# Patient Record
Sex: Female | Born: 1945 | Race: White | Hispanic: No | Smoking: Never smoker
Health system: Southern US, Community
[De-identification: ages and names within clinical notes are randomized; demographics above are authoritative.]

## PROBLEM LIST (undated history)

## (undated) DIAGNOSIS — M1990 Unspecified osteoarthritis, unspecified site: Secondary | ICD-10-CM

## (undated) DIAGNOSIS — I10 Essential (primary) hypertension: Secondary | ICD-10-CM

## (undated) DIAGNOSIS — K589 Irritable bowel syndrome without diarrhea: Secondary | ICD-10-CM

## (undated) DIAGNOSIS — M069 Rheumatoid arthritis, unspecified: Secondary | ICD-10-CM

## (undated) DIAGNOSIS — T7840XA Allergy, unspecified, initial encounter: Secondary | ICD-10-CM

## (undated) DIAGNOSIS — E039 Hypothyroidism, unspecified: Secondary | ICD-10-CM

## (undated) DIAGNOSIS — H269 Unspecified cataract: Secondary | ICD-10-CM

## (undated) DIAGNOSIS — G43909 Migraine, unspecified, not intractable, without status migrainosus: Secondary | ICD-10-CM

## (undated) DIAGNOSIS — R011 Cardiac murmur, unspecified: Secondary | ICD-10-CM

## (undated) DIAGNOSIS — N809 Endometriosis, unspecified: Secondary | ICD-10-CM

## (undated) DIAGNOSIS — J309 Allergic rhinitis, unspecified: Secondary | ICD-10-CM

## (undated) HISTORY — PX: TUBAL LIGATION: SHX77

## (undated) HISTORY — PX: BREAST SURGERY: SHX581

## (undated) HISTORY — PX: OOPHORECTOMY: SHX86

## (undated) HISTORY — PX: APPENDECTOMY: SHX54

## (undated) HISTORY — DX: Endometriosis, unspecified: N80.9

## (undated) HISTORY — DX: Allergy, unspecified, initial encounter: T78.40XA

## (undated) HISTORY — PX: ABDOMINAL HYSTERECTOMY: SHX81

## (undated) HISTORY — PX: TOTAL ABDOMINAL HYSTERECTOMY W/ BILATERAL SALPINGOOPHORECTOMY: SHX83

## (undated) HISTORY — DX: Unspecified cataract: H26.9

## (undated) HISTORY — PX: TONSILLECTOMY: SUR1361

---
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
## 1973-11-08 DIAGNOSIS — R011 Cardiac murmur, unspecified: Secondary | ICD-10-CM

## 1973-11-08 HISTORY — DX: Cardiac murmur, unspecified: R01.1

## 1973-11-08 HISTORY — PX: BREAST EXCISIONAL BIOPSY: SUR124

## 2005-02-04 ENCOUNTER — Ambulatory Visit: Payer: Self-pay | Admitting: Internal Medicine

## 2005-03-09 ENCOUNTER — Ambulatory Visit: Payer: Self-pay | Admitting: Internal Medicine

## 2006-03-29 ENCOUNTER — Ambulatory Visit: Payer: Self-pay | Admitting: Internal Medicine

## 2007-04-13 ENCOUNTER — Ambulatory Visit: Payer: Self-pay | Admitting: Internal Medicine

## 2008-04-16 ENCOUNTER — Ambulatory Visit: Payer: Self-pay | Admitting: Internal Medicine

## 2009-03-31 ENCOUNTER — Emergency Department: Payer: Self-pay | Admitting: Emergency Medicine

## 2009-04-01 ENCOUNTER — Ambulatory Visit: Payer: Self-pay | Admitting: Internal Medicine

## 2009-04-02 ENCOUNTER — Ambulatory Visit: Payer: Self-pay | Admitting: Internal Medicine

## 2009-04-17 ENCOUNTER — Ambulatory Visit: Payer: Self-pay | Admitting: Internal Medicine

## 2009-06-06 ENCOUNTER — Ambulatory Visit: Payer: Self-pay | Admitting: Internal Medicine

## 2010-04-21 ENCOUNTER — Ambulatory Visit: Payer: Self-pay | Admitting: Internal Medicine

## 2011-04-23 ENCOUNTER — Ambulatory Visit: Payer: Self-pay | Admitting: Internal Medicine

## 2012-04-25 ENCOUNTER — Ambulatory Visit: Payer: Self-pay | Admitting: Internal Medicine

## 2013-04-26 ENCOUNTER — Ambulatory Visit: Payer: Self-pay | Admitting: Internal Medicine

## 2015-04-03 ENCOUNTER — Other Ambulatory Visit: Payer: Self-pay | Admitting: Internal Medicine

## 2015-04-03 DIAGNOSIS — Z1231 Encounter for screening mammogram for malignant neoplasm of breast: Secondary | ICD-10-CM

## 2015-04-15 ENCOUNTER — Ambulatory Visit
Admission: RE | Admit: 2015-04-15 | Discharge: 2015-04-15 | Disposition: A | Discharge status: Home / Self Care | Payer: Medicare Other | Source: Ambulatory Visit | Attending: Internal Medicine | Admitting: Internal Medicine

## 2015-04-15 ENCOUNTER — Ambulatory Visit: Payer: Self-pay

## 2015-04-15 ENCOUNTER — Other Ambulatory Visit: Payer: Self-pay | Admitting: Internal Medicine

## 2015-04-15 DIAGNOSIS — Z1231 Encounter for screening mammogram for malignant neoplasm of breast: Secondary | ICD-10-CM

## 2016-02-02 ENCOUNTER — Encounter: Payer: Self-pay | Admitting: *Deleted

## 2016-02-03 ENCOUNTER — Ambulatory Visit
Admission: RE | Admit: 2016-02-03 | Discharge: 2016-02-03 | Disposition: A | Discharge status: Home / Self Care | Payer: Medicare Other | Source: Ambulatory Visit | Attending: Gastroenterology | Admitting: Gastroenterology

## 2016-02-03 ENCOUNTER — Ambulatory Visit: Payer: Medicare Other | Admitting: Anesthesiology

## 2016-02-03 ENCOUNTER — Encounter: Payer: Self-pay | Admitting: *Deleted

## 2016-02-03 ENCOUNTER — Encounter
Admission: RE | Disposition: A | Discharge status: Home / Self Care | Payer: Self-pay | Source: Ambulatory Visit | Attending: Gastroenterology

## 2016-02-03 DIAGNOSIS — Z438 Encounter for attention to other artificial openings: Secondary | ICD-10-CM | POA: Insufficient documentation

## 2016-02-03 DIAGNOSIS — I10 Essential (primary) hypertension: Secondary | ICD-10-CM | POA: Diagnosis not present

## 2016-02-03 DIAGNOSIS — J309 Allergic rhinitis, unspecified: Secondary | ICD-10-CM | POA: Diagnosis not present

## 2016-02-03 DIAGNOSIS — Z7951 Long term (current) use of inhaled steroids: Secondary | ICD-10-CM | POA: Insufficient documentation

## 2016-02-03 DIAGNOSIS — Z9889 Other specified postprocedural states: Secondary | ICD-10-CM | POA: Diagnosis not present

## 2016-02-03 DIAGNOSIS — K5730 Diverticulosis of large intestine without perforation or abscess without bleeding: Secondary | ICD-10-CM | POA: Insufficient documentation

## 2016-02-03 DIAGNOSIS — Z90710 Acquired absence of both cervix and uterus: Secondary | ICD-10-CM | POA: Insufficient documentation

## 2016-02-03 DIAGNOSIS — E039 Hypothyroidism, unspecified: Secondary | ICD-10-CM | POA: Insufficient documentation

## 2016-02-03 DIAGNOSIS — Z79899 Other long term (current) drug therapy: Secondary | ICD-10-CM | POA: Insufficient documentation

## 2016-02-03 DIAGNOSIS — K589 Irritable bowel syndrome without diarrhea: Secondary | ICD-10-CM | POA: Insufficient documentation

## 2016-02-03 DIAGNOSIS — M069 Rheumatoid arthritis, unspecified: Secondary | ICD-10-CM | POA: Insufficient documentation

## 2016-02-03 DIAGNOSIS — Z1211 Encounter for screening for malignant neoplasm of colon: Secondary | ICD-10-CM | POA: Diagnosis not present

## 2016-02-03 HISTORY — DX: Unspecified osteoarthritis, unspecified site: M19.90

## 2016-02-03 HISTORY — DX: Rheumatoid arthritis, unspecified: M06.9

## 2016-02-03 HISTORY — DX: Essential (primary) hypertension: I10

## 2016-02-03 HISTORY — DX: Migraine, unspecified, not intractable, without status migrainosus: G43.909

## 2016-02-03 HISTORY — DX: Irritable bowel syndrome, unspecified: K58.9

## 2016-02-03 HISTORY — DX: Hypothyroidism, unspecified: E03.9

## 2016-02-03 HISTORY — DX: Allergic rhinitis, unspecified: J30.9

## 2016-02-03 HISTORY — PX: COLONOSCOPY WITH PROPOFOL: SHX5780

## 2016-02-03 SURGERY — COLONOSCOPY WITH PROPOFOL
Anesthesia: General

## 2016-02-03 MED ORDER — MIDAZOLAM HCL 5 MG/5ML IJ SOLN
INTRAMUSCULAR | Status: DC | PRN
  Administered 2016-02-03: 1 mg via INTRAVENOUS

## 2016-02-03 MED ORDER — PROPOFOL 500 MG/50ML IV EMUL
INTRAVENOUS | Status: DC | PRN
  Administered 2016-02-03: 120 ug/kg/min via INTRAVENOUS

## 2016-02-03 MED ORDER — PROPOFOL 10 MG/ML IV BOLUS
INTRAVENOUS | Status: DC | PRN
  Administered 2016-02-03: 50 mg via INTRAVENOUS

## 2016-02-03 MED ORDER — SODIUM CHLORIDE 0.9 % IV SOLN
INTRAVENOUS | Status: DC
  Administered 2016-02-03: 11:00:00 via INTRAVENOUS

## 2016-02-03 MED ORDER — LIDOCAINE HCL (CARDIAC) 20 MG/ML IV SOLN
INTRAVENOUS | Status: DC | PRN
  Administered 2016-02-03: 100 mg via INTRAVENOUS

## 2016-02-03 MED ORDER — SODIUM CHLORIDE 0.9 % IV SOLN: INTRAVENOUS | Status: DC

## 2016-02-03 MED ORDER — FENTANYL CITRATE (PF) 100 MCG/2ML IJ SOLN
INTRAMUSCULAR | Status: DC | PRN
  Administered 2016-02-03: 50 ug via INTRAVENOUS

## 2016-02-03 NOTE — Transfer of Care (Signed)
Immediate Anesthesia Transfer of Care Note  Patient: Barbara Tucker  Procedure(s) Performed: Procedure(s): COLONOSCOPY WITH PROPOFOL (N/A)  Patient Location: PACU  Anesthesia Type:General  Level of Consciousness: awake, alert , oriented and patient cooperative  Airway & Oxygen Therapy: Patient Spontanous Breathing and Patient connected to nasal cannula oxygen  Post-op Assessment: Report given to RN and Post -op Vital signs reviewed and stable  Post vital signs: Reviewed and stable  Last Vitals:  Filed Vitals:   02/03/16 1020 02/03/16 1257  BP: 122/57 110/95  Pulse: 63 59  Temp: 37 C   Resp: 14 14    Complications: No apparent anesthesia complications

## 2016-02-03 NOTE — Anesthesia Postprocedure Evaluation (Signed)
Anesthesia Post Note  Patient: Barbara Tucker  Procedure(s) Performed: Procedure(s) (LRB): COLONOSCOPY WITH PROPOFOL (N/A)  Patient location during evaluation: Endoscopy Anesthesia Type: General Level of consciousness: awake and alert Pain management: pain level controlled Vital Signs Assessment: post-procedure vital signs reviewed and stable Respiratory status: spontaneous breathing, nonlabored ventilation, respiratory function stable and patient connected to nasal cannula oxygen Cardiovascular status: blood pressure returned to baseline and stable Postop Assessment: no signs of nausea or vomiting Anesthetic complications: no    Last Vitals:  Filed Vitals:   02/03/16 1317 02/03/16 1327  BP: 144/69 144/55  Pulse: 53 55  Temp:    Resp: 9 17    Last Pain: There were no vitals filed for this visit.               Cleda Mccreedy Piscitello

## 2016-02-03 NOTE — Op Note (Signed)
[facility], [place] Gastroenterology Patient Name: Barbara Tucker Procedure Date: 02/03/2016 11:57 AM MRN: [id number] Account #: [id number] Date of Birth: [birth year] Admit Type: Outpatient Age: 70 Room: [facility] ENDO ROOM 4 Gender: Female Note Status: Finalized Procedure:            Colonoscopy Indications:          Screening for colorectal malignant neoplasm Providers:            Christena Deem, MD Referring MD:         Barbette Reichmann, MD (Referring MD) Medicines:            Monitored Anesthesia Care Complications:        No immediate complications. Procedure:            Pre-Anesthesia Assessment:                       - ASA Grade Assessment: III - A patient with severe                        systemic disease.                       After obtaining informed consent, the colonoscope was                        passed under direct vision. Throughout the procedure,                        the patient's blood pressure, pulse, and oxygen                        saturations were monitored continuously. The                        Colonoscope was introduced through the anus and                        advanced to the the cecum, identified by appendiceal                        orifice and ileocecal valve. The colonoscopy was                        unusually difficult due to a tortuous colon. Successful                        completion of the procedure was aided by using manual                        pressure. The patient tolerated the procedure well. Findings:      A single medium-mouthed diverticulum was found in the ascending colon.      The sigmoid colon and descending colon were significantly tortuous.       Advancing the scope required changing the patient to a supine position       and using manual pressure.      The digital rectal exam was normal.      The exam was otherwise without abnormality. Impression:           - Diverticulosis in the ascending colon.       - Tortuous colon.                       -  The examination was otherwise normal.                       - No specimens collected. Recommendation:       - Discharge patient to home. Procedure Code(s):    --- Professional ---                       [id number], Colonoscopy, flexible; diagnostic, including                        collection of specimen(s) by brushing or washing, when                        performed (separate procedure) Diagnosis Code(s):    --- Professional ---                       Z12.11, Encounter for screening for malignant neoplasm                        of colon                       K57.30, Diverticulosis of large intestine without                        perforation or abscess without bleeding                       Q43.8, Other specified congenital malformations of                        intestine CPT copyright 2016 American Medical Association. All rights reserved. The codes documented in this report are preliminary and upon coder review may  be revised to meet current compliance requirements. Christena Deem, MD 02/03/2016 12:56:01 PM This report has been signed electronically. Number of Addenda: 0 Note Initiated On: 02/03/2016 11:57 AM Scope Withdrawal Time: 0 hours 6 minutes 51 seconds  Total Procedure Duration: 0 hours 34 minutes 54 seconds       [facility]

## 2016-02-03 NOTE — H&P (Signed)
Outpatient short stay form Pre-procedure 02/03/2016 11:59 AM Barbara Deem MD  Primary Physician: Dr Barbette Reichmann  Reason for visit:  Screening colonoscopy  History of present illness:  Patient is a 70 year old female presenting today for colonoscopy. Her last 1 was 11 years ago. There are no findings. She tolerated her prep well. She takes no aspirin or blood thinning medications.    Current facility-administered medications:  .  0.9 %  sodium chloride infusion, , Intravenous, Continuous, Barbara Deem, MD, Last Rate: 20 mL/hr at 02/03/16 1036 .  0.9 %  sodium chloride infusion, , Intravenous, Continuous, Barbara Deem, MD  Prescriptions prior to admission  Medication Sig Dispense Refill Last Dose  . ascorbic acid (VITAMIN C) 500 MG tablet Take 500 mg by mouth daily.     . calcium carbonate (OSCAL) 1500 (600 Ca) MG TABS tablet Take 600 mg of elemental calcium by mouth 2 (two) times daily with a meal.     . dicyclomine (BENTYL) 10 MG capsule Take 10 mg by mouth 4 (four) times daily.     Marland Kitchen estradiol (ESTRACE) 1 MG tablet Take 1 mg by mouth daily.     Marland Kitchen etodolac (LODINE) 400 MG tablet Take 400 mg by mouth 2 (two) times daily.     . fexofenadine (ALLEGRA) 180 MG tablet Take 180 mg by mouth daily.     . folic acid (FOLVITE) 1 MG tablet Take 1 mg by mouth daily.     . hydroxychloroquine (PLAQUENIL) 200 MG tablet Take 200 mg by mouth 2 (two) times daily.     Marland Kitchen levothyroxine (SYNTHROID, LEVOTHROID) 25 MCG tablet Take 25 mcg by mouth daily before breakfast.   02/03/2016 at 0800  . losartan (COZAAR) 50 MG tablet Take 50 mg by mouth daily.   02/03/2016 at 0900  . methotrexate (RHEUMATREX) 2.5 MG tablet Take 2.5 mg by mouth 2 (two) times daily.     . metoprolol succinate (TOPROL-XL) 25 MG 24 hr tablet Take 25 mg by mouth daily.   02/03/2016 at 0900  . Multiple Vitamin (MULTIVITAMIN) tablet Take 1 tablet by mouth daily.     . Omega-3 Fatty Acids (OMEGA-3 EPA FISH OIL PO) Take 340-1,000  mg by mouth daily.     Marland Kitchen omeprazole (PRILOSEC) 20 MG capsule Take 20 mg by mouth daily.        Allergies  Allergen Reactions  . Codeine   . Sulfa Antibiotics      Past Medical History  Diagnosis Date  . Hypertension   . IBS (irritable bowel syndrome)   . Allergic rhinitis   . Migraine   . Osteoarthritis   . Rheumatoid arthritis (HCC)   . Hypothyroidism     Review of systems:      Physical Exam    Heart and lungs: Regular rate and rhythm without rub or gallop, lungs are bilaterally clear.    HEENT: Normocephalic atraumatic eyes are anicteric    Other:     Pertinant exam for procedure: Soft nontender nondistended bowel sounds positive normoactive.    Planned proceedures: Colonoscopy and indicated procedures. I have discussed the risks benefits and complications of procedures to include not limited to bleeding, infection, perforation and the risk of sedation and the patient wishes to proceed.    Barbara Deem, MD Gastroenterology 02/03/2016  11:59 AM

## 2016-02-03 NOTE — Anesthesia Preprocedure Evaluation (Addendum)
Anesthesia Evaluation  Patient identified by MRN, date of birth, ID band Patient awake    Reviewed: Allergy & Precautions, H&P , NPO status , Patient's Chart, lab work & pertinent test results  History of Anesthesia Complications (+) PONV and history of anesthetic complications  Airway Mallampati: II  TM Distance: >3 FB Neck ROM: limited    Dental  (+) Poor Dentition   Pulmonary neg pulmonary ROS, neg shortness of breath,    Pulmonary exam normal breath sounds clear to auscultation       Cardiovascular Exercise Tolerance: Good hypertension, (-) angina(-) Past MI and (-) DOE Normal cardiovascular exam Rhythm:regular Rate:Normal     Neuro/Psych  Headaches, negative psych ROS   GI/Hepatic negative GI ROS, Neg liver ROS, neg GERD  ,  Endo/Other  Hypothyroidism   Renal/GU negative Renal ROS  negative genitourinary   Musculoskeletal  (+) Arthritis ,   Abdominal   Peds  Hematology negative hematology ROS (+)   Anesthesia Other Findings Past Medical History:   Hypertension                                                 IBS (irritable bowel syndrome)                               Allergic rhinitis                                            Migraine                                                     Osteoarthritis                                               Rheumatoid arthritis (HCC)                                   Hypothyroidism                                              Past Surgical History:   BREAST BIOPSY                                   Right 1975           Comment:excisional - negative   ABDOMINAL HYSTERECTOMY                                        TONSILLECTOMY  APPENDECTOMY                                                  TUBAL LIGATION                                               BMI    Body Mass Index   21.59 kg/m 2    Signs and symptoms  suggestive of sleep apnea    Reproductive/Obstetrics negative OB ROS                            Anesthesia Physical Anesthesia Plan  ASA: III  Anesthesia Plan: General   Post-op Pain Management:    Induction:   Airway Management Planned:   Additional Equipment:   Intra-op Plan:   Post-operative Plan:   Informed Consent: I have reviewed the patients History and Physical, chart, labs and discussed the procedure including the risks, benefits and alternatives for the proposed anesthesia with the patient or authorized representative who has indicated his/her understanding and acceptance.   Dental Advisory Given  Plan Discussed with: Anesthesiologist, CRNA and Surgeon  Anesthesia Plan Comments:         Anesthesia Quick Evaluation

## 2016-02-04 ENCOUNTER — Encounter: Payer: Self-pay | Admitting: Gastroenterology

## 2016-04-13 ENCOUNTER — Other Ambulatory Visit: Payer: Self-pay | Admitting: Internal Medicine

## 2016-04-13 DIAGNOSIS — Z1231 Encounter for screening mammogram for malignant neoplasm of breast: Secondary | ICD-10-CM

## 2016-04-29 ENCOUNTER — Ambulatory Visit: Payer: Medicare Other

## 2016-04-30 ENCOUNTER — Other Ambulatory Visit: Payer: Self-pay | Admitting: Internal Medicine

## 2016-04-30 ENCOUNTER — Ambulatory Visit
Admission: RE | Admit: 2016-04-30 | Discharge: 2016-04-30 | Disposition: A | Discharge status: Home / Self Care | Payer: Medicare Other | Source: Ambulatory Visit | Attending: Internal Medicine | Admitting: Internal Medicine

## 2016-04-30 DIAGNOSIS — Z1231 Encounter for screening mammogram for malignant neoplasm of breast: Secondary | ICD-10-CM | POA: Insufficient documentation

## 2016-08-09 DIAGNOSIS — Z23 Encounter for immunization: Secondary | ICD-10-CM | POA: Diagnosis not present

## 2016-08-17 DIAGNOSIS — Z79899 Other long term (current) drug therapy: Secondary | ICD-10-CM | POA: Diagnosis not present

## 2016-08-17 DIAGNOSIS — M059 Rheumatoid arthritis with rheumatoid factor, unspecified: Secondary | ICD-10-CM | POA: Diagnosis not present

## 2016-08-26 DIAGNOSIS — M059 Rheumatoid arthritis with rheumatoid factor, unspecified: Secondary | ICD-10-CM | POA: Diagnosis not present

## 2016-08-26 DIAGNOSIS — M150 Primary generalized (osteo)arthritis: Secondary | ICD-10-CM | POA: Diagnosis not present

## 2016-09-08 ENCOUNTER — Encounter: Payer: Self-pay | Admitting: Occupational Therapy

## 2016-09-08 ENCOUNTER — Ambulatory Visit: Payer: Medicare Other | Attending: Rheumatology | Admitting: Occupational Therapy

## 2016-09-08 DIAGNOSIS — M25642 Stiffness of left hand, not elsewhere classified: Secondary | ICD-10-CM | POA: Diagnosis not present

## 2016-09-08 DIAGNOSIS — M79644 Pain in right finger(s): Secondary | ICD-10-CM | POA: Diagnosis not present

## 2016-09-08 DIAGNOSIS — M25641 Stiffness of right hand, not elsewhere classified: Secondary | ICD-10-CM | POA: Diagnosis not present

## 2016-09-08 DIAGNOSIS — M79641 Pain in right hand: Secondary | ICD-10-CM | POA: Insufficient documentation

## 2016-09-08 DIAGNOSIS — M79645 Pain in left finger(s): Secondary | ICD-10-CM | POA: Diagnosis not present

## 2016-09-08 DIAGNOSIS — M79642 Pain in left hand: Secondary | ICD-10-CM | POA: Diagnosis not present

## 2016-09-08 DIAGNOSIS — M6281 Muscle weakness (generalized): Secondary | ICD-10-CM | POA: Diagnosis not present

## 2016-09-08 NOTE — Therapy (Signed)
[facility]OR[facility]ICINE [address], [place], [address] Phone: [phone]   Fax:  [phone]  Occupational Therapy Evaluation  Patient Details  Name: Barbara Tucker MRN: [id number] Date of Birth: 70/23/1947 Referring Provider: Dr . Beverley Fiedler  Encounter Date: 09/08/2016      OT End of Session - 09/08/16 1328    Visit Number 1   Number of Visits 12   Date for OT Re-Evaluation 11/03/16  G Code every 10th visit   Authorization Type BC[employer] Medicare - Needs G Code Every 10 th Visit   Authorization Time Period G code every 10 th visit   Authorization - Visit Number 1   Authorization - Number of Visits 10   OT Start Time [phone]   OT Stop Time 1055   OT Time Calculation (min) 66 min   Activity Tolerance Patient tolerated treatment well   Behavior During Therapy [facility] for tasks assessed/performed      Past Medical History:  Diagnosis Date  . Allergic rhinitis   . Hypertension   . Hypothyroidism   . IBS (irritable bowel syndrome)   . Migraine   . Osteoarthritis   . Rheumatoid arthritis [facility])     Past Surgical History:  Procedure Laterality Date  . ABDOMINAL HYSTERECTOMY    . APPENDECTOMY    . BREAST EXCISIONAL BIOPSY Right 1975   excisional - negative  . COLONOSCOPY WITH PROPOFOL N/A 02/03/2016   Procedure: COLONOSCOPY WITH PROPOFOL;  Surgeon: Christena Deem, MD;  Location: [facility] ENDOSCOPY;  Service: Endoscopy;  Laterality: N/A;  . OOPHORECTOMY Bilateral   . TONSILLECTOMY    . TUBAL LIGATION      There were no vitals filed for this visit.      Subjective Assessment - 09/08/16 0953    Subjective  Pt reports that she has bilateral Hand srthritis. She sees Dr Gerri Lins [place]. She states that about 2-3 years ago, she was dx w/ RA. She is RHD and presents today w/ c/o pain L IF, small as well as right thumb CMC and MF.   Patient Stated Goals Increase ROM; and be able to grasp on R hand [facility]); less pain   Currently  in Pain? Yes   Pain Score 1    Pain Location --  Right thumb   Pain Orientation Right   Pain Descriptors / Indicators Sore;Aching   Pain Onset More than a month ago   Pain Frequency Intermittent   Aggravating Factors  Grasp/grip   Pain Relieving Factors Wears fingerless glove that helps to keep right hand warm; Wrist brace (cock-up splint) at Noc   Multiple Pain Sites No           [facility] OT Assessment - 09/08/16 0001      Assessment   Diagnosis Bilateral Hand Pain secondary to Arhtritis/RA   Referring Provider Dr . Beverley Fiedler   Onset Date --  "Years"     Precautions   Precautions None   Required Braces or Orthoses Other Brace/Splint   Other Brace/Splint Wears wrist cock-up splint at Noc R      Restrictions   Weight Bearing Restrictions No     Balance Screen   Has the patient fallen in the past 6 months Yes   How many times? I missed a step and tumbled a little bit.   Has the patient had a decrease in activity level because of a fear of falling?  No   Is the patient  reluctant to leave their home because of a fear of falling?  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 (Opening jars, small zippers etc).     IADL   Light Housekeeping Does personal laundry completely;Launders small items, rinses stockings, etc.;Performs light daily tasks such as dishwashing, bed making   Meal Prep --  Does not do, never has. Spouse does   Medication Management Is responsible for taking medication in correct dosages at correct time     Mobility   Mobility Status Independent     Written Expression   Dominant Hand Right   Handwriting --  Pt states 75% of her normal handwriting R     Vision - History   Baseline Vision Wears glasses all the time     Cognition   Overall Cognitive Status Within Functional Limits for tasks assessed     Observation/Other Assessments   Other Surveys  Select   Quick DASH  Quick DASH Disability/Symptom Score = 20.45 bilateral hands   Outcome Measures Quick DASH Work Module = 18.75; Performing Module (quilting/sewing) = 25     Sensation   Light Touch Appears Intact  Pt reports R shoulder numbness for some time now     Coordination   Gross Motor Movements are Fluid and Coordinated Yes   Fine Motor Movements are Fluid and Coordinated No   Coordination Pt reports that coordination "Is not as easy as it used to be"     Edema   Edema Pt reports edema in affected joints particularly when joints are painful or having an arthritic flare.     ROM / Strength   AROM / PROM / Strength AROM;Strength     AROM   Overall AROM  Within functional limits for tasks performed   AROM Assessment Site Finger   Right/Left Finger Left     Right Hand AROM   R Thumb MCP 0-60 58 Degrees   R Thumb IP 0-80 76 Degrees  -20* extension   R Thumb Opposition to Index --  WFL's   R Index  MCP 0-90 92 Degrees   R  Index PIP 0-100 110 Degrees   R Index DIP 0-70 50 Degrees  -16   R Long  MCP 0-90 96 Degrees   R Long PIP 0-100 110 Degrees   R Long DIP 0-70 66 Degrees  -35   R Ring  MCP 0-90 92 Degrees  R Ring PIP 0-100 108 Degrees   R Ring DIP 0-70 66 Degrees   R Little  MCP 0-90 98 Degrees   R Little PIP 0-100 104 Degrees   R Little DIP 0-70 64 Degrees  C/O soreness and pain in DIPJ/  -40* extension     Left Hand AROM   L Thumb MCP 0-60 70 Degrees   L Thumb IP 0-80 66 Degrees  -15* extension   L Thumb Opposition to Index --  WFL's; Able to oppose to small   L Index  MCP 0-90 90 Degrees   L Index PIP 0-100 90 Degrees   L Index DIP 0-70 55 Degrees  -30   L Long  MCP 0-90 92 Degrees   L Long PIP 0-100 94 Degrees   L Long DIP 0-70 79 Degrees  -10   L Ring  MCP 0-90 84 Degrees   L Ring PIP 0-100 100 Degrees   L Ring DIP 0-70 80 Degrees   L Little  MCP 0-90 91 Degrees   L Little PIP 0-100 101 Degrees   L Little DIP 0-70 76 Degrees  -28     Hand Function   Right Hand Grip (lbs) 56   Right Hand Lateral Pinch 12 lbs   Right Hand 3 Point Pinch 11 lbs   Left Hand Grip (lbs) 50   Left Hand Lateral Pinch 12 lbs   Left 3 point pinch 10.5 lbs        Pt was instructed in the following Home AROM program for bilateral hands/wrists: Opposition; tendon gliding; finger extension on tabletop; strengthening/AROM of RD - palm flat on table, spread thumb, move first finger to thumb, move second finger, move ring finger and move little finger. Pt verbalized understanding in clinic and was able to return demonstration.            OT Treatments/Exercises (OP) - 09/08/16 0001      Modalities   Modalities Paraffin     RUE Paraffin   Number Minutes Paraffin 10 Minutes   RUE Paraffin Location Hand   Type --  Prior to HEP   Comments Decrease pain, increased circulation, increase AROM joints      LUE Paraffin   Number Minutes Paraffin 10 Minutes   LUE Paraffin Location Hand   Type --   Prior to HEP   Comments Decrease pain, increased circulation, increase AROM joints                OT Education - 09/08/16 1326    Education provided Yes   Education Details Home program for general AROM ex's bilateral hands w/ arthritis; Discussed findings from assessment, recommendations and POC.   Person(s) Educated Patient   Methods Explanation;Demonstration;Verbal cues;Handout   Comprehension Verbalized understanding;Verbal cues required;Need further instruction          OT Short Term Goals - 09/08/16 1339      OT SHORT TERM GOAL #1   Title Pt will be I HEP bilateral Hands/UE's for AROM   Time 2   Period Weeks   Status New     OT SHORT TERM GOAL #2   Title Pt will be Mod I splint use, care and precautions bilateral hands    Time 2   Period Weeks   Status New           OT Long Term Goals - 09/08/16 1340      OT LONG TERM GOAL #1   Title Pt will be  Mod I bilateral UE's for HEP secondary to arthritis   Time 4   Period Weeks   Status New     OT LONG TERM GOAL #2   Title Pt will be Mod I energy conservation techniques as seen by ability to I'ly state 2-3 techniques that she can use during ADL's   Time 4   Period Weeks   Status New     OT LONG TERM GOAL #3   Title Pt will be Mod I Joint protection techniques as seen by demonstration in clinic/w/ functional activities   Time 4   Period Weeks   Status New     OT LONG TERM GOAL #4   Title Pt will I'ly state 2-3 possible pieces of A/E that she may use to assist with increasing Independence w/ ADL's/functional activity.   Time 4   Period Weeks   Status New               Plan - 09-29-2016 1331    Clinical Impression Statement Pt is a plesant 70 y/o RHD female whom presents with bilateral hand and wrist pain secondary to arthritis and RA. Per OT assessment today, pt has dificits in ROM, strength impacting her ability to perform ADL's, IADL's, homemaking and functional activities. She also reports  difficulty with handwriting, sewing and occasionally drops things. She should benefit from out-pt OT/hand therapy for pt education, home program, A/E recommendations, splinting etc. in order to assist with increased independence for daily activities using bilateral hands as well as symptom management.   Rehab Potential Good   OT Frequency 2x / week   OT Duration 4 weeks   OT Treatment/Interventions Self-care/ADL training;Fluidtherapy;Moist Heat;DME and/or AE instruction;Splinting;Patient/family education;Therapeutic exercises;Ultrasound;Therapeutic exercise;Therapeutic activities;Parrafin;Energy conservation;Manual Therapy   Plan Paraffin/Fluidotherapy, Consider issue of neoprene thumb CMC splint next visit (R > L for pain) for use with functional activity; Review/upgrade HEP.   Consulted and Agree with Plan of Care Patient      Patient will benefit from skilled therapeutic intervention in order to improve the following deficits and impairments:  Decreased coordination, Decreased range of motion, Impaired flexibility, Decreased activity tolerance, Pain, Impaired UE functional use, Decreased knowledge of use of DME, Decreased strength  Visit Diagnosis: Pain in right hand - Plan: Ot plan of care cert/re-cert  Pain in left hand - Plan: Ot plan of care cert/re-cert  Pain in right finger(s) - Plan: Ot plan of care cert/re-cert  Pain in left finger(s) - Plan: Ot plan of care cert/re-cert  Muscle weakness (generalized) - Plan: Ot plan of care cert/re-cert  Stiffness of right hand, not elsewhere classified - Plan: Ot plan of care cert/re-cert  Stiffness of left hand, not elsewhere classified - Plan: Ot plan of care cert/re-cert      G-Codes - September 29, 2016 1348    Functional Assessment Tool Used Quick DASH; Clinical Judgement   Functional Limitation Carrying, moving and handling objects   Carrying, Moving and Handling Objects Current Status (I0973) At least 20 percent but less than 40 percent  impaired, limited or restricted   Carrying, Moving and Handling Objects Goal Status (Z3299) At least 1 percent but less than 20 percent impaired, limited or restricted      Problem List There are no active problems to display for this patient.   Charletta Cousin, Basil Buffin Beth Dixon 09/29/16, 2:03 PM  [place] [facility] PHYSICAL AND SPORTS MEDICINE [address] [place], [address] Phone: [phone]   Fax:  [phone]  Name:  CHINAZA ROOKE MRN: [id number] Date of Birth: June 12, 1946

## 2016-09-08 NOTE — Therapy (Signed)
[facility] PHYS[facility]OR[facility]ICINE [address] [place], [address] Phone: [phone]   Fax:  [phone]  Occupational Therapy Evaluation  Patient Details  Name: Barbara Tucker MRN: [id number] Date of Birth: February 09, 1946 Referring Provider: Dr . Andrez Grime  Encounter Date: 09/08/2016      OT End of Session - 09/08/16 1328    Visit Number 1   Number of Visits 12   Date for OT Re-Evaluation 11/03/16  G Code every 10th visit   Authorization Type BCBS Medicare - Needs G Code Every 10 th Visit   Authorization Time Period G code every 10 th visit   Authorization - Visit Number 1   Authorization - Number of Visits 10   OT Start Time [phone]   OT Stop Time 1055   OT Time Calculation (min) 66 min   Activity Tolerance Patient tolerated treatment well   Behavior During Therapy [facility] for tasks assessed/performed      Past Medical History:  Diagnosis Date  . Allergic rhinitis   . Hypertension   . Hypothyroidism   . IBS (irritable bowel syndrome)   . Migraine   . Osteoarthritis   . Rheumatoid arthritis [facility])     Past Surgical History:  Procedure Laterality Date  . ABDOMINAL HYSTERECTOMY    . APPENDECTOMY    . BREAST EXCISIONAL BIOPSY Right 1975   excisional - negative  . COLONOSCOPY WITH PROPOFOL N/A 02/03/2016   Procedure: COLONOSCOPY WITH PROPOFOL;  Surgeon: Christena Deem, MD;  Location: [facility] ENDOSCOPY;  Service: Endoscopy;  Laterality: N/A;  . OOPHORECTOMY Bilateral   . TONSILLECTOMY    . TUBAL LIGATION      There were no vitals filed for this visit.      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 She sees Dr Gerri Lins [place]. She states that about 2-3 years ago, she was dx w/ RA. She is RHD and presents today w/ c/o pain L IF, small as well as right thumb CMC and MF.   Patient Stated Goals Increase ROM; and be able to grasp on R hand [facility]); less pain    Currently in Pain? Yes   Pain Score 1    Pain Location --  Right thumb   Pain Orientation Right   Pain Descriptors / Indicators Sore;Aching   Pain Onset More than a month ago   Pain Frequency Intermittent   Aggravating Factors  Grasp/grip   Pain Relieving Factors Wears fingerless glove that helps to keep right hand warm; Wrist brace (cock-up splint) at Noc   Multiple Pain Sites No           [facility] OT Assessment - 09/08/16 0001      Assessment   Diagnosis Bilateral Hand Pain secondary to Arhtritis/RA   Referring Provider Dr . Beverley Fiedler   Onset Date --  "Years"     Precautions   Precautions None   Required Braces or Orthoses Other Brace/Splint   Other Brace/Splint Wears wrist cock-up splint at Noc R      Restrictions   Weight Bearing Restrictions No     Balance Screen   Has the patient fallen in the past 6 months Yes   How many times? I missed a step and tumbled a little bit.   Has the patient had a decrease in activity level because of a fear of falling?  No   Is the  patient reluctant to leave their home because of a fear of falling?  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 (Opening jars, small zippers etc).     IADL   Light Housekeeping Does personal laundry completely;Launders small items, rinses stockings, etc.;Performs light daily tasks such as dishwashing, bed making   Meal Prep --  Does not do, never has. Spouse does   Medication Management Is responsible for taking medication in correct dosages at correct time     Mobility   Mobility Status Independent     Written Expression   Dominant Hand Right   Handwriting --  Pt states 75% of her normal handwriting R     Vision - History   Baseline Vision Wears glasses all the time     Cognition   Overall Cognitive Status Within Functional Limits for tasks assessed     Observation/Other Assessments   Other Surveys  Select   Quick DASH  Quick DASH Disability/Symptom Score = 20.45 bilateral hands   Outcome Measures Quick DASH Work Module = 18.75; Performing Module (quilting/sewing) = 25     Sensation   Light Touch Appears Intact  Pt reports R shoulder numbness for some time now     Coordination   Gross Motor Movements are Fluid and Coordinated Yes   Fine Motor Movements are Fluid and Coordinated No   Coordination Pt reports that coordination "Is not as easy as it used to be"     Edema   Edema Pt reports edema in affected joints particularly when joints are painful or having an arthritic flare.     ROM / Strength   AROM / PROM / Strength AROM;Strength     AROM   Overall AROM  Within functional limits for tasks performed   AROM Assessment Site Finger   Right/Left Finger Left     Right Hand AROM   R Thumb MCP 0-60 58 Degrees   R Thumb IP 0-80 76 Degrees  -20* extension   R Thumb Opposition to Index --  WFL's   R Index  MCP 0-90 92 Degrees    R Index PIP 0-100 110 Degrees   R Index DIP 0-70 50 Degrees  -16   R Long  MCP 0-90 96 Degrees   R Long PIP 0-100 110 Degrees   R Long DIP 0-70 66 Degrees  -35   R Ring  MCP 0-90 92  Degrees   R Ring PIP 0-100 108 Degrees   R Ring DIP 0-70 66 Degrees   R Little  MCP 0-90 98 Degrees   R Little PIP 0-100 104 Degrees   R Little DIP 0-70 64 Degrees  C/O soreness and pain in DIPJ/  -40* extension     Left Hand AROM   L Thumb MCP 0-60 70 Degrees   L Thumb IP 0-80 66 Degrees  -15* extension   L Thumb Opposition to Index --  WFL's; Able to oppose to small   L Index  MCP 0-90 90 Degrees   L Index PIP 0-100 90 Degrees   L Index DIP 0-70 55 Degrees  -30   L Long  MCP 0-90 92 Degrees   L Long PIP 0-100 94 Degrees   L Long DIP 0-70 79 Degrees  -10   L Ring  MCP 0-90 84 Degrees   L Ring PIP 0-100 100 Degrees   L Ring DIP 0-70 80 Degrees   L Little  MCP 0-90 91 Degrees   L Little PIP 0-100 101 Degrees   L Little DIP 0-70 76 Degrees  -28     Hand Function   Right Hand Grip (lbs) 56   Right Hand Lateral Pinch 12 lbs   Right Hand 3 Point Pinch 11 lbs   Left Hand Grip (lbs) 50   Left Hand Lateral Pinch 12 lbs   Left 3 point pinch 10.5 lbs                  OT Treatments/Exercises (OP) - 09/08/16 0001      Modalities   Modalities Paraffin     RUE Paraffin   Number Minutes Paraffin 10 Minutes   RUE Paraffin Location Hand   Type --  Prior to HEP   Comments Decrease pain, increased circulation, increase AROM joints      LUE Paraffin   Number Minutes Paraffin 10 Minutes   LUE Paraffin Location Hand   Type --  Prior to HEP   Comments Decrease pain, increased circulation, increase AROM joints                OT Education - 09/08/16 1326    Education provided Yes   Education Details Home program for general AROM ex's bilateral hands w/ arthritis; Discussed findings from assessment, recommendations and POC.   Person(s) Educated Patient   Methods  Explanation;Demonstration;Verbal cues;Handout   Comprehension Verbalized understanding;Verbal cues required;Need further instruction          OT Short Term Goals - 09/08/16 1339      OT SHORT TERM GOAL #1   Title Pt will be I HEP bilateral Hands/UE's for AROM   Time 2   Period Weeks   Status New     OT SHORT TERM GOAL #2   Title Pt will be Mod I splint use, care and precautions bilateral hands    Time 2   Period Weeks   Status New           OT Long Term Goals - 09/08/16 1340      OT LONG TERM GOAL #1   Title Pt will be Mod I bilateral UE's for HEP secondary to arthritis   Time 4   Period Weeks   Status New     OT LONG TERM GOAL #2   Title Pt will be Mod I energy conservation techniques as seen by ability to I'ly state 2-3 techniques that she can  use during ADL's   Time 4   Period Weeks   Status New     OT LONG TERM GOAL #3   Title Pt will be Mod I Joint protection techniques as seen by demonstration in clinic/w/ functional activities   Time 4   Period Weeks   Status New     OT LONG TERM GOAL #4   Title Pt will I'ly state 2-3 possible pieces of A/E that she may use to assist with increasing Independence w/ ADL's/functional activity.   Time 4   Period Weeks   Status New               Plan - 09/30/2016 1331    Clinical Impression Statement Pt is a plesant 70 y/o RHD female whom presents with bilateral hand and wrist pain secondary to arthritis and RA. Per OT assessment today, pt has deficits in ROM, strength impacting her ability to perform ADL's, IADL's, homemaking and functional activities. She also reports difficulty with handwriting, sewing and occasionally drops things. She should benefit from out-pt OT/hand therapy for pt education, home program, A/E recommendations, splinting etc. in order to assist with increased independence for daily activities using bilateral hands as well as symptom management.   Rehab Potential Good   OT Frequency 2x / week    OT Duration 4 weeks   OT Treatment/Interventions Self-care/ADL training;Fluidtherapy;Moist Heat;DME and/or AE instruction;Splinting;Patient/family education;Therapeutic exercises;Ultrasound;Therapeutic exercise;Therapeutic activities;Parrafin;Energy conservation;Manual Therapy   Plan Paraffin/Fluidotherapy, Consider issue of neoprene thumb CMC splint next visit (R > L for pain) for use with functional activity; Review/upgrade HEP.   Consulted and Agree with Plan of Care Patient      Patient will benefit from skilled therapeutic intervention in order to improve the following deficits and impairments:  Decreased coordination, Decreased range of motion, Impaired flexibility, Decreased activity tolerance, Pain, Impaired UE functional use, Decreased knowledge of use of DME, Decreased strength  Visit Diagnosis: Pain in right hand - Plan: Ot plan of care cert/re-cert  Pain in left hand - Plan: Ot plan of care cert/re-cert  Pain in right finger(s) - Plan: Ot plan of care cert/re-cert  Pain in left finger(s) - Plan: Ot plan of care cert/re-cert  Muscle weakness (generalized) - Plan: Ot plan of care cert/re-cert  Stiffness of right hand, not elsewhere classified - Plan: Ot plan of care cert/re-cert  Stiffness of left hand, not elsewhere classified - Plan: Ot plan of care cert/re-cert      G-Codes - September 30, 2016 1348    Functional Assessment Tool Used Quick DASH; Clinical Judgement   Functional Limitation Carrying, moving and handling objects   Carrying, Moving and Handling Objects Current Status (Q2449) At least 20 percent but less than 40 percent impaired, limited or restricted   Carrying, Moving and Handling Objects Goal Status (P5300) At least 1 percent but less than 20 percent impaired, limited or restricted      Problem List There are no active problems to display for this patient.   Charletta Cousin, Amy Beth Dixon, OTR/L 2016/09/30, 1:59 PM  [facility]  PHYSICAL AND SPORTS MEDICINE [address], [place], [address] Phone: [phone]   Fax:  [phone]  Name: Barbara Tucker MRN: [id number] Date of Birth: October 22, 1946

## 2016-09-08 NOTE — Patient Instructions (Addendum)
Pt was instructed in the following Home AROM program for bilateral hands/wrists: Opposition; tendon gliding; finger extension on tabletop; strengthening/AROM of RD - palm flat on table, spread thumb, move first finger to thumb, move second finger, move ring finger and move little finger. Pt verbalized understanding in clinic and was able to return demonstration.

## 2016-09-14 ENCOUNTER — Ambulatory Visit: Payer: Medicare Other | Admitting: Occupational Therapy

## 2016-09-14 DIAGNOSIS — M79641 Pain in right hand: Secondary | ICD-10-CM | POA: Diagnosis not present

## 2016-09-14 DIAGNOSIS — M25641 Stiffness of right hand, not elsewhere classified: Secondary | ICD-10-CM

## 2016-09-14 DIAGNOSIS — M6281 Muscle weakness (generalized): Secondary | ICD-10-CM

## 2016-09-14 DIAGNOSIS — M79644 Pain in right finger(s): Secondary | ICD-10-CM

## 2016-09-14 DIAGNOSIS — M79642 Pain in left hand: Secondary | ICD-10-CM

## 2016-09-14 DIAGNOSIS — M79645 Pain in left finger(s): Secondary | ICD-10-CM

## 2016-09-14 DIAGNOSIS — M25642 Stiffness of left hand, not elsewhere classified: Secondary | ICD-10-CM

## 2016-09-14 NOTE — Therapy (Signed)
[facility] PHYS[facility]ORTS MEDICINE [address], [place], [address] Phone: [phone]   Fax:  [phone]  Occupational Therapy Treatment  Patient Details  Name: Barbara Tucker MRN: [id number] Date of Birth: 1946/02/04 Referring Provider: Dr . Beverley Fiedler  Encounter Date: 09/14/2016      OT End of Session - 09/14/16 1724    Visit Number 2   Number of Visits 12   Date for OT Re-Evaluation 11/03/16   Authorization Type BCBS Medicare - Needs G Code Every 10 th Visit   Authorization Time Period G code every 10 th visit   Authorization - Visit Number 1   Authorization - Number of Visits 10   OT Start Time 1030   OT Stop Time 1128   OT Time Calculation (min) 58 min   Activity Tolerance Patient tolerated treatment well   Behavior During Therapy [facility] for tasks assessed/performed      Past Medical History:  Diagnosis Date  . Allergic rhinitis   . Hypertension   . Hypothyroidism   . IBS (irritable bowel syndrome)   . Migraine   . Osteoarthritis   . Rheumatoid arthritis [facility])     Past Surgical History:  Procedure Laterality Date  . ABDOMINAL HYSTERECTOMY    . APPENDECTOMY    . BREAST EXCISIONAL BIOPSY Right 1975   excisional - negative  . COLONOSCOPY WITH PROPOFOL N/A 02/03/2016   Procedure: COLONOSCOPY WITH PROPOFOL;  Surgeon: Christena Deem, MD;  Location: [facility] ENDOSCOPY;  Service: Endoscopy;  Laterality: N/A;  . OOPHORECTOMY Bilateral   . TONSILLECTOMY    . TUBAL LIGATION      There were no vitals filed for this visit.      Subjective Assessment - 09/14/16 1712    Subjective  I did the exercises - pain different , every day - today still my R thumb , and index ,and L index finger - cannot make tigth fist - grip is good - but cannot hold on to objects   Patient Stated Goals Increase ROM; and be able to grasp on R hand [facility]); less pain   Currently in Pain? Yes   Pain Score 3    Pain Location Finger (Comment which  one)   Pain Orientation Right   Pain Descriptors / Indicators Aching;Sore   Pain Onset More than a month ago   Pain Frequency Intermittent                      OT Treatments/Exercises (OP) - 09/14/16 0001      RUE Paraffin   Number Minutes Paraffin 10 Minutes   RUE Paraffin Location Hand   Comments decrease pain and stiffness at [facility]     LUE Paraffin   Number Minutes Paraffin 10 Minutes   LUE Paraffin Location Hand   Comments Decrease pain and stiffness at [facility]     Measurements taken for bilateral thumbs and 2nd digit See flow sheet Fitted pt with medium + CMC neoprene splint for R thumb - but pt's pain and grip was the same   pt still had 11-12 lbs for lateral and 3 point  - splint use not indicated at this time  Pt ed on joint protection principles - using larger joints and avoid sustained/tight grip As well as respect pain - if 2hrs and 12-14 hrs later after act - modify  Ed on  AE - and hand out provided for both   Soft tissue  mobs done to webspace after paraffin - and add some gentle stretch to PA and RA   pt showed increase ROM  In R thumb by 3-5 degrees  and L thumb 5-8 degrees  As well as increase AROM at 2nd digits -MC and PIP's  Pt to cont with tendon glides , opposition to all digits, RD of digits and add AROM for PA and RA of thumb  Pt to use heat prior to ROM HEP to decrease pain and stiffness and increase motion             OT Education - 09/14/16 1724    Education provided Yes   Education Details HEP changes and AE/joint protection principles ed on    Person(s) Educated Patient   Methods Explanation;Demonstration;Tactile cues;Verbal cues;Handout   Comprehension Verbal cues required;Returned demonstration;Verbalized understanding          OT Short Term Goals - 09/08/16 1339      OT SHORT TERM GOAL #1   Title Pt will be I HEP bilateral Hands/UE's for AROM   Time 2   Period Weeks   Status New     OT SHORT TERM GOAL #2   Title Pt  will be Mod I splint use, care and precautions bilateral hands    Time 2   Period Weeks   Status New           OT Long Term Goals - 09/08/16 1340      OT LONG TERM GOAL #1   Title Pt will be Mod I bilateral UE's for HEP secondary to arthritis   Time 4   Period Weeks   Status New     OT LONG TERM GOAL #2   Title Pt will be Mod I energy conservation techniques as seen by ability to I'ly state 2-3 techniques that she can use during ADL's   Time 4   Period Weeks   Status New     OT LONG TERM GOAL #3   Title Pt will be Mod I Joint protection techniques as seen by demonstration in clinic/w/ functional activities   Time 4   Period Weeks   Status New     OT LONG TERM GOAL #4   Title Pt will I'ly state 2-3 possible pieces of A/E that she may use to assist with increasing Independence w/ ADL's/functional activity.   Time 4   Period Weeks   Status New               Plan - 09/14/16 1725    Clinical Impression Statement Pt's AROM and strength is WFL - pt to show some hyper extention at MP 's of thumbs - and decrease AROM at bilateal 2nd digits - but after paraffin and ROM - pt increase ROM - and pain decrease - pt  did same with and without CMC neoprenes split when fitted and tested on R hand - pt to cont with HEP and joint protection /AE - and follow up with me in week - otherwise can phone and cancel if doing okay - pt to maintain ROM and stenght - and keep pain under control     Rehab Potential Good   OT Frequency 1x / week   OT Duration 2 weeks   OT Treatment/Interventions Self-care/ADL training;Fluidtherapy;Moist Heat;DME and/or AE instruction;Splinting;Patient/family education;Therapeutic exercises;Ultrasound;Therapeutic exercise;Therapeutic activities;Parrafin;Energy conservation;Manual Therapy   Plan assess progress with HEP for pain , ROM at thumbs and 2nd digits   OT Home Exercise Plan see pt instruction  Consulted and Agree with Plan of Care Patient       Patient will benefit from skilled therapeutic intervention in order to improve the following deficits and impairments:  Decreased coordination, Decreased range of motion, Impaired flexibility, Decreased activity tolerance, Pain, Impaired UE functional use, Decreased knowledge of use of DME, Decreased strength  Visit Diagnosis: Pain in right hand  Pain in left hand  Pain in right finger(s)  Pain in left finger(s)  Muscle weakness (generalized)  Stiffness of right hand, not elsewhere classified  Stiffness of left hand, not elsewhere classified    Problem List There are no active problems to display for this patient.   Oletta Cohn OTR/L,CLT 09/14/2016, 5:29 PM  [place] [facility] PHYSICAL [facility] SPORTS MEDICINE [address] [place], [address] Phone: [phone]   Fax:  [phone]  Name: Barbara Tucker MRN: [id number] Date of Birth: [birth year]

## 2016-09-14 NOTE — Patient Instructions (Signed)
Pt to use oint protection principles - using larger joints and avoid sustained/tight grip As well as respect pain - if 2hrs and 12-14 hrs later after act - modify  Ed on  AE - and hand out provided for both   Soft tissue mobs can be done prior to ROM at home after heat to webspace   pt showed increase ROM  In R thumb by 3-5 degrees  and L thumb 5-8 degrees in clinic As well as increase AROM at 2nd digits -MC and PIP's  Pt to cont with tendon glides , opposition to all digits, RD of digits and add AROM for PA and RA of thumb  Pt to use heat prior to ROM HEP to decrease pain and stiffness and increase motion

## 2016-09-21 ENCOUNTER — Ambulatory Visit: Payer: Medicare Other | Admitting: Occupational Therapy

## 2016-09-21 DIAGNOSIS — M79641 Pain in right hand: Secondary | ICD-10-CM | POA: Diagnosis not present

## 2016-09-21 DIAGNOSIS — M25641 Stiffness of right hand, not elsewhere classified: Secondary | ICD-10-CM

## 2016-09-21 DIAGNOSIS — M79642 Pain in left hand: Secondary | ICD-10-CM

## 2016-09-21 DIAGNOSIS — M79645 Pain in left finger(s): Secondary | ICD-10-CM

## 2016-09-21 DIAGNOSIS — M79644 Pain in right finger(s): Secondary | ICD-10-CM

## 2016-09-21 DIAGNOSIS — M25642 Stiffness of left hand, not elsewhere classified: Secondary | ICD-10-CM

## 2016-09-21 DIAGNOSIS — M6281 Muscle weakness (generalized): Secondary | ICD-10-CM

## 2016-09-21 NOTE — Therapy (Signed)
[place] [facility] [address] [place], [address] Phone: [phone]   Fax:  [phone]  Occupational Therapy Treatment/discharge  Patient Details  Name: Barbara Tucker MRN: [id number] Date of Birth: [birth year] Referring Provider: Dr . Beverley Fiedler  Encounter Date: 09/21/2016      OT End of Session - 09/21/16 1510    Visit Number 3   Number of Visits 3   Date for OT Re-Evaluation 09/21/16   OT Start Time 1130   OT Stop Time 1208   OT Time Calculation (min) 38 min   Activity Tolerance Patient tolerated treatment well   Behavior During Therapy [facility] for tasks assessed/performed      Past Medical History:  Diagnosis Date  . Allergic rhinitis   . Hypertension   . Hypothyroidism   . IBS (irritable bowel syndrome)   . Migraine   . Osteoarthritis   . Rheumatoid arthritis [facility])     Past Surgical History:  Procedure Laterality Date  . ABDOMINAL HYSTERECTOMY    . APPENDECTOMY    . BREAST EXCISIONAL BIOPSY Right 1975   excisional - negative  . COLONOSCOPY WITH PROPOFOL N/A 02/03/2016   Procedure: COLONOSCOPY WITH PROPOFOL;  Surgeon: Christena Deem, MD;  Location: [facility] ENDOSCOPY;  Service: Endoscopy;  Laterality: N/A;  . OOPHORECTOMY Bilateral   . TONSILLECTOMY    . TUBAL LIGATION      There were no vitals filed for this visit.      Subjective Assessment - 09/21/16 1131    Subjective  My thumbs are better - trying to use the joint protection with avoid tight grip, use larger joints - have a few questions - but doing better - pain better    Patient Stated Goals Increase ROM; and be able to grasp on R hand [facility]); less pain   Currently in Pain? No/denies            [facility] OT Assessment - 09/21/16 0001      Strength   Strength Assessment Site --   Right Hand Grip (lbs) 56   Right Hand Lateral Pinch 14 lbs   Right Hand 3 Point Pinch 12 lbs   Left Hand Grip (lbs) 50   Left Hand Lateral Pinch 14  lbs   Left Hand 3 Point Pinch 10 lbs      Discuss with pt progress in pain , joint protection and modifications Pt had questions on jewelery , moving large pots or strain food out of large pots ,  using of larger joints without increase pain or stress on wrists, computer setup.  Also reviewed HEP with pt  - had question on RD of digits and manual massage to webspace prior to AROM of thumb PA and RA Pt's grip is still at the top for her age, and prehension mid range - pt to decrease stress on joints , decrease pain , and increase /maintain ROM in digits - and maintain strength.                     OT Education - 09/21/16 1509    Education provided Yes   Education Details discharge instructions    Person(s) Educated Patient   Methods Explanation;Demonstration;Tactile cues;Verbal cues   Comprehension Verbal cues required;Returned demonstration;Verbalized understanding          OT Short Term Goals - 09/21/16 1512      OT SHORT TERM GOAL #1   Title Pt will be  I HEP bilateral Hands/UE's for AROM   Status Achieved     OT SHORT TERM GOAL #2   Title Pt will be Mod I splint use, care and precautions bilateral hands    Baseline pt not candidate for splints yet   Status Revised           OT Long Term Goals - 09/21/16 1513      OT LONG TERM GOAL #1   Title Pt will be Mod I bilateral UE's for HEP secondary to arthritis   Status Achieved     OT LONG TERM GOAL #2   Title Pt will be Mod I energy conservation techniques as seen by ability to I'ly state 2-3 techniques that she can use during ADL's   Status Achieved     OT LONG TERM GOAL #3   Title Pt will be Mod I Joint protection techniques as seen by demonstration in clinic/w/ functional activities   Status Achieved     OT LONG TERM GOAL #4   Title Pt will I'ly state 2-3 possible pieces of A/E that she may use to assist with increasing Independence w/ ADL's/functional activity.   Status Achieved                Plan - 09/21/16 1510    Clinical Impression Statement Pt made progress in pain , applying joint protection to ADL's and IADL's - pt show AROM WFL - grip is at the top for her age - and prehension midrange - pt has knowledge on HEP and joint protection/AE- and is discharge at this time with home program    OT Treatment/Interventions Self-care/ADL training;Fluidtherapy;Moist Heat;DME and/or AE instruction;Splinting;Patient/family education;Therapeutic exercises;Ultrasound;Therapeutic exercise;Therapeutic activities;Parrafin;Energy conservation;Manual Therapy   Plan discharge with home program   OT Home Exercise Plan see pt instruction      Patient will benefit from skilled therapeutic intervention in order to improve the following deficits and impairments:     Visit Diagnosis: Pain in right hand  Pain in left hand  Pain in right finger(s)  Pain in left finger(s)  Muscle weakness (generalized)  Stiffness of right hand, not elsewhere classified  Stiffness of left hand, not elsewhere classified    Problem List There are no active problems to display for this patient.   Oletta Cohn OTR/L,CLT 09/21/2016, 4:56 PM  [place] [facility] PHYSICAL AND SPORTS MEDICINE [address], [place], [address] Phone: [phone]   Fax:  [phone]  Name: Barbara Tucker MRN: [id number] Date of Birth: June 06, 1946

## 2016-09-21 NOTE — Patient Instructions (Signed)
Pt to cont with joint protection and AE  HEP after heat - tendon glides, Opposition, RD of digits and thumb PA and RA

## 2016-10-07 DIAGNOSIS — Z0000 Encounter for general adult medical examination without abnormal findings: Secondary | ICD-10-CM | POA: Diagnosis not present

## 2016-10-07 DIAGNOSIS — Z1231 Encounter for screening mammogram for malignant neoplasm of breast: Secondary | ICD-10-CM | POA: Diagnosis not present

## 2016-10-07 DIAGNOSIS — M0579 Rheumatoid arthritis with rheumatoid factor of multiple sites without organ or systems involvement: Secondary | ICD-10-CM | POA: Diagnosis not present

## 2016-10-07 DIAGNOSIS — I10 Essential (primary) hypertension: Secondary | ICD-10-CM | POA: Diagnosis not present

## 2016-10-07 DIAGNOSIS — M159 Polyosteoarthritis, unspecified: Secondary | ICD-10-CM | POA: Diagnosis not present

## 2016-10-14 DIAGNOSIS — M0579 Rheumatoid arthritis with rheumatoid factor of multiple sites without organ or systems involvement: Secondary | ICD-10-CM | POA: Diagnosis not present

## 2016-10-14 DIAGNOSIS — Z0000 Encounter for general adult medical examination without abnormal findings: Secondary | ICD-10-CM | POA: Diagnosis not present

## 2016-10-14 DIAGNOSIS — M150 Primary generalized (osteo)arthritis: Secondary | ICD-10-CM | POA: Diagnosis not present

## 2016-10-14 DIAGNOSIS — I10 Essential (primary) hypertension: Secondary | ICD-10-CM | POA: Diagnosis not present

## 2016-11-16 DIAGNOSIS — M059 Rheumatoid arthritis with rheumatoid factor, unspecified: Secondary | ICD-10-CM | POA: Diagnosis not present

## 2016-11-16 DIAGNOSIS — M150 Primary generalized (osteo)arthritis: Secondary | ICD-10-CM | POA: Diagnosis not present

## 2017-02-15 DIAGNOSIS — M150 Primary generalized (osteo)arthritis: Secondary | ICD-10-CM | POA: Diagnosis not present

## 2017-02-15 DIAGNOSIS — M059 Rheumatoid arthritis with rheumatoid factor, unspecified: Secondary | ICD-10-CM | POA: Diagnosis not present

## 2017-03-02 DIAGNOSIS — M25512 Pain in left shoulder: Secondary | ICD-10-CM | POA: Diagnosis not present

## 2017-03-02 DIAGNOSIS — M059 Rheumatoid arthritis with rheumatoid factor, unspecified: Secondary | ICD-10-CM | POA: Diagnosis not present

## 2017-03-02 DIAGNOSIS — Z79899 Other long term (current) drug therapy: Secondary | ICD-10-CM | POA: Diagnosis not present

## 2017-03-02 DIAGNOSIS — M150 Primary generalized (osteo)arthritis: Secondary | ICD-10-CM | POA: Diagnosis not present

## 2017-04-11 DIAGNOSIS — M0579 Rheumatoid arthritis with rheumatoid factor of multiple sites without organ or systems involvement: Secondary | ICD-10-CM | POA: Diagnosis not present

## 2017-04-11 DIAGNOSIS — I10 Essential (primary) hypertension: Secondary | ICD-10-CM | POA: Diagnosis not present

## 2017-04-11 DIAGNOSIS — M150 Primary generalized (osteo)arthritis: Secondary | ICD-10-CM | POA: Diagnosis not present

## 2017-04-18 DIAGNOSIS — M0579 Rheumatoid arthritis with rheumatoid factor of multiple sites without organ or systems involvement: Secondary | ICD-10-CM | POA: Diagnosis not present

## 2017-04-18 DIAGNOSIS — Z1231 Encounter for screening mammogram for malignant neoplasm of breast: Secondary | ICD-10-CM | POA: Diagnosis not present

## 2017-04-18 DIAGNOSIS — J302 Other seasonal allergic rhinitis: Secondary | ICD-10-CM | POA: Diagnosis not present

## 2017-04-18 DIAGNOSIS — I10 Essential (primary) hypertension: Secondary | ICD-10-CM | POA: Diagnosis not present

## 2017-04-26 ENCOUNTER — Other Ambulatory Visit: Payer: Self-pay | Admitting: Internal Medicine

## 2017-04-26 DIAGNOSIS — Z1231 Encounter for screening mammogram for malignant neoplasm of breast: Secondary | ICD-10-CM

## 2017-05-10 ENCOUNTER — Ambulatory Visit
Admission: RE | Admit: 2017-05-10 | Discharge: 2017-05-10 | Disposition: A | Discharge status: Home / Self Care | Payer: Medicare Other | Source: Ambulatory Visit | Attending: Internal Medicine | Admitting: Internal Medicine

## 2017-05-10 DIAGNOSIS — R928 Other abnormal and inconclusive findings on diagnostic imaging of breast: Secondary | ICD-10-CM | POA: Insufficient documentation

## 2017-05-10 DIAGNOSIS — N6489 Other specified disorders of breast: Secondary | ICD-10-CM | POA: Diagnosis not present

## 2017-05-10 DIAGNOSIS — Z1231 Encounter for screening mammogram for malignant neoplasm of breast: Secondary | ICD-10-CM | POA: Diagnosis not present

## 2017-05-16 ENCOUNTER — Other Ambulatory Visit: Payer: Self-pay | Admitting: Internal Medicine

## 2017-05-16 DIAGNOSIS — R928 Other abnormal and inconclusive findings on diagnostic imaging of breast: Secondary | ICD-10-CM

## 2017-05-16 DIAGNOSIS — N6489 Other specified disorders of breast: Secondary | ICD-10-CM

## 2017-05-23 ENCOUNTER — Ambulatory Visit
Admission: RE | Admit: 2017-05-23 | Discharge: 2017-05-23 | Disposition: A | Discharge status: Home / Self Care | Payer: Medicare Other | Source: Ambulatory Visit | Attending: Internal Medicine | Admitting: Internal Medicine

## 2017-05-23 DIAGNOSIS — N6489 Other specified disorders of breast: Secondary | ICD-10-CM

## 2017-05-23 DIAGNOSIS — R928 Other abnormal and inconclusive findings on diagnostic imaging of breast: Secondary | ICD-10-CM

## 2017-05-23 DIAGNOSIS — R922 Inconclusive mammogram: Secondary | ICD-10-CM | POA: Diagnosis not present

## 2017-06-01 DIAGNOSIS — M059 Rheumatoid arthritis with rheumatoid factor, unspecified: Secondary | ICD-10-CM | POA: Diagnosis not present

## 2017-06-01 DIAGNOSIS — Z79899 Other long term (current) drug therapy: Secondary | ICD-10-CM | POA: Diagnosis not present

## 2017-08-02 DIAGNOSIS — M069 Rheumatoid arthritis, unspecified: Secondary | ICD-10-CM | POA: Diagnosis not present

## 2017-08-02 DIAGNOSIS — Z79899 Other long term (current) drug therapy: Secondary | ICD-10-CM | POA: Diagnosis not present

## 2017-08-12 DIAGNOSIS — Z23 Encounter for immunization: Secondary | ICD-10-CM | POA: Diagnosis not present

## 2017-08-30 DIAGNOSIS — Z79899 Other long term (current) drug therapy: Secondary | ICD-10-CM | POA: Diagnosis not present

## 2017-08-30 DIAGNOSIS — M059 Rheumatoid arthritis with rheumatoid factor, unspecified: Secondary | ICD-10-CM | POA: Diagnosis not present

## 2017-09-13 DIAGNOSIS — Z79899 Other long term (current) drug therapy: Secondary | ICD-10-CM | POA: Diagnosis not present

## 2017-09-13 DIAGNOSIS — M059 Rheumatoid arthritis with rheumatoid factor, unspecified: Secondary | ICD-10-CM | POA: Diagnosis not present

## 2017-09-13 DIAGNOSIS — M150 Primary generalized (osteo)arthritis: Secondary | ICD-10-CM | POA: Diagnosis not present

## 2017-10-11 DIAGNOSIS — M0579 Rheumatoid arthritis with rheumatoid factor of multiple sites without organ or systems involvement: Secondary | ICD-10-CM | POA: Diagnosis not present

## 2017-10-11 DIAGNOSIS — Z0000 Encounter for general adult medical examination without abnormal findings: Secondary | ICD-10-CM | POA: Diagnosis not present

## 2017-10-11 DIAGNOSIS — J302 Other seasonal allergic rhinitis: Secondary | ICD-10-CM | POA: Diagnosis not present

## 2017-10-11 DIAGNOSIS — I10 Essential (primary) hypertension: Secondary | ICD-10-CM | POA: Diagnosis not present

## 2017-10-26 DIAGNOSIS — R232 Flushing: Secondary | ICD-10-CM | POA: Diagnosis not present

## 2017-10-26 DIAGNOSIS — J302 Other seasonal allergic rhinitis: Secondary | ICD-10-CM | POA: Diagnosis not present

## 2017-10-26 DIAGNOSIS — I10 Essential (primary) hypertension: Secondary | ICD-10-CM | POA: Diagnosis not present

## 2017-10-26 DIAGNOSIS — M0579 Rheumatoid arthritis with rheumatoid factor of multiple sites without organ or systems involvement: Secondary | ICD-10-CM | POA: Diagnosis not present

## 2017-12-14 DIAGNOSIS — Z79899 Other long term (current) drug therapy: Secondary | ICD-10-CM | POA: Diagnosis not present

## 2017-12-14 DIAGNOSIS — M059 Rheumatoid arthritis with rheumatoid factor, unspecified: Secondary | ICD-10-CM | POA: Diagnosis not present

## 2018-03-07 DIAGNOSIS — M059 Rheumatoid arthritis with rheumatoid factor, unspecified: Secondary | ICD-10-CM | POA: Diagnosis not present

## 2018-03-07 DIAGNOSIS — Z79899 Other long term (current) drug therapy: Secondary | ICD-10-CM | POA: Diagnosis not present

## 2018-03-14 DIAGNOSIS — M150 Primary generalized (osteo)arthritis: Secondary | ICD-10-CM | POA: Diagnosis not present

## 2018-03-14 DIAGNOSIS — Z79899 Other long term (current) drug therapy: Secondary | ICD-10-CM | POA: Diagnosis not present

## 2018-03-14 DIAGNOSIS — M059 Rheumatoid arthritis with rheumatoid factor, unspecified: Secondary | ICD-10-CM | POA: Diagnosis not present

## 2018-03-24 ENCOUNTER — Telehealth: Payer: Self-pay | Admitting: Family Medicine

## 2018-03-24 NOTE — Telephone Encounter (Signed)
That's fine

## 2018-03-24 NOTE — Telephone Encounter (Signed)
Copied from CRM [id number]. Topic: Inquiry >> Mar 24, 2018  2:31 PM Jonette Eva wrote: Reason for CRM: pt called to ask since her husband [phone], is a pt of [facility] she would like to ask if its possible for her to be a new pt of hers pt told that [facility] is not taking new pt's, call to advise  >> Mar 24, 2018  3:18 PM Sharol Given wrote: Forwarding for approval.

## 2018-04-26 ENCOUNTER — Encounter: Payer: Self-pay | Admitting: Family Medicine

## 2018-05-22 ENCOUNTER — Ambulatory Visit: Payer: Self-pay | Admitting: Family Medicine

## 2018-05-25 ENCOUNTER — Ambulatory Visit: Payer: Medicare Other | Admitting: Family Medicine

## 2018-05-25 ENCOUNTER — Encounter: Payer: Self-pay | Admitting: Family Medicine

## 2018-05-25 VITALS — BP 137/82 | HR 63 | Temp 99.2°F | Ht 67.3 in | Wt 147.6 lb

## 2018-05-25 DIAGNOSIS — I10 Essential (primary) hypertension: Secondary | ICD-10-CM | POA: Insufficient documentation

## 2018-05-25 DIAGNOSIS — E039 Hypothyroidism, unspecified: Secondary | ICD-10-CM | POA: Diagnosis not present

## 2018-05-25 DIAGNOSIS — Z1231 Encounter for screening mammogram for malignant neoplasm of breast: Secondary | ICD-10-CM

## 2018-05-25 DIAGNOSIS — Z13220 Encounter for screening for lipoid disorders: Secondary | ICD-10-CM

## 2018-05-25 DIAGNOSIS — Z1159 Encounter for screening for other viral diseases: Secondary | ICD-10-CM | POA: Diagnosis not present

## 2018-05-25 DIAGNOSIS — M059 Rheumatoid arthritis with rheumatoid factor, unspecified: Secondary | ICD-10-CM

## 2018-05-25 DIAGNOSIS — M8580 Other specified disorders of bone density and structure, unspecified site: Secondary | ICD-10-CM | POA: Diagnosis not present

## 2018-05-25 DIAGNOSIS — E038 Other specified hypothyroidism: Secondary | ICD-10-CM | POA: Insufficient documentation

## 2018-05-25 DIAGNOSIS — Z1239 Encounter for other screening for malignant neoplasm of breast: Secondary | ICD-10-CM

## 2018-05-25 DIAGNOSIS — M069 Rheumatoid arthritis, unspecified: Secondary | ICD-10-CM | POA: Insufficient documentation

## 2018-05-25 NOTE — Assessment & Plan Note (Signed)
Under good control. Continue current regimen. Continue to monitor. Checking labs. Await results. Call with any concerns. Does not need refills at this time.

## 2018-05-25 NOTE — Assessment & Plan Note (Signed)
Under good control. Continue current regimen. Continue to monitor. Checking labs. Await results. Call with any concerns. Does not need refills at this time.  

## 2018-05-25 NOTE — Progress Notes (Signed)
BP 137/82 (BP Location: Left Arm, Patient Position: Sitting, Cuff Size: Normal)   Pulse 63   Temp 99.2 F (37.3 C)   Ht 5' 7.3" (1.709 m)   Wt 147 lb 9 oz (66.9 kg)   SpO2 97%   BMI 22.91 kg/m    Subjective:    Patient ID: Barbara Tucker, female    DOB: [birth year], 72 y.o.   MRN: [id number]  HPI: Barbara Tucker is a 72 y.o. female  Chief Complaint  Patient presents with  . Hypertension  . Hypothyroidism  . Establish Care   Needs a mammogram ordered.   HYPERTENSION Hypertension status: controlled  Satisfied with current treatment? yes Duration of hypertension: chronic BP monitoring frequency:  not checking BP medication side effects:  no Medication compliance: excellent compliance Previous BP meds: losartan, metoprolol Aspirin: yes Recurrent headaches: yes Visual changes: yes Palpitations: no Dyspnea: no Chest pain: no Lower extremity edema: no Dizzy/lightheaded: no  HYPOTHYROIDISM- diagnosed several years ago, hasn't been taking the  Thyroid control status:stable Satisfied with current treatment? yes Medication side effects: Not on anything Medication compliance: poor compliance Recent dose adjustment:no Fatigue: no Cold intolerance: no Heat intolerance: no Weight gain: no Weight loss: no Constipation: no Diarrhea/loose stools: no Palpitations: no Lower extremity edema: no Anxiety/depressed mood: no   Active Ambulatory Problems    Diagnosis Date Noted  . Hypertension   . Hypothyroidism   . Rheumatoid arthritis ([place])   . Osteopenia 05/25/2018   Resolved Ambulatory Problems    Diagnosis Date Noted  . No Resolved Ambulatory Problems   Past Medical History:  Diagnosis Date  . Allergic rhinitis   . Endometriosis   . Hypertension   . Hypothyroidism   . IBS (irritable bowel syndrome)   . Migraine   . Osteoarthritis   . Rheumatoid arthritis [facility])    Past Surgical History:  Procedure Laterality Date  . ABDOMINAL HYSTERECTOMY    .  APPENDECTOMY    . BREAST EXCISIONAL BIOPSY Right 1975   excisional - negative  . COLONOSCOPY WITH PROPOFOL N/A 02/03/2016   Procedure: COLONOSCOPY WITH PROPOFOL;  Surgeon: Lollie Sails, MD;  Location: [facility] ENDOSCOPY;  Service: Endoscopy;  Laterality: N/A;  . OOPHORECTOMY Bilateral   . TONSILLECTOMY    . TUBAL LIGATION     Outpatient Encounter Medications as of 05/25/2018  Medication Sig  . ascorbic acid (VITAMIN C) 500 MG tablet Take 500 mg by mouth daily.  . calcium carbonate (OSCAL) 1500 (600 Ca) MG TABS tablet Take 600 mg of elemental calcium by mouth 2 (two) times daily with a meal.  . Calcium Polycarbophil (FIBER-CAPS PO) Take by mouth.  . dicyclomine (BENTYL) 10 MG capsule Take 10 mg by mouth 4 (four) times daily.  Marland Kitchen estradiol (ESTRACE) 1 MG tablet Take 1 mg by mouth daily.  Marland Kitchen etodolac (LODINE) 400 MG tablet Take 400 mg by mouth 2 (two) times daily.  . fexofenadine (ALLEGRA) 180 MG tablet Take 180 mg by mouth daily.  . folic acid (FOLVITE) 1 MG tablet Take 1 mg by mouth daily.  Marland Kitchen guaiFENesin (MUCINEX) 600 MG 12 hr tablet Take by mouth 2 (two) times daily.  . hydroxychloroquine (PLAQUENIL) 200 MG tablet Take 200 mg by mouth 2 (two) times daily.  Marland Kitchen losartan (COZAAR) 50 MG tablet Take 50 mg by mouth daily.  . methotrexate (RHEUMATREX) 2.5 MG tablet Take 2.5 mg by mouth 2 (two) times daily.  . metoprolol succinate (TOPROL-XL) 25 MG 24 hr tablet Take 25  mg by mouth daily.  . Multiple Vitamin (MULTIVITAMIN) tablet Take 1 tablet by mouth daily.  . Omega-3 Fatty Acids (OMEGA-3 EPA FISH OIL PO) Take 340-1,000 mg by mouth daily.  Marland Kitchen omeprazole (PRILOSEC) 20 MG capsule Take 20 mg by mouth daily.  . [DISCONTINUED] levothyroxine (SYNTHROID, LEVOTHROID) 25 MCG tablet Take 25 mcg by mouth daily before breakfast.   No facility-administered encounter medications on file as of 05/25/2018.    Allergies  Allergen Reactions  . Codeine   . Lisinopril Other (See Comments)    Migraine  . Sulfa  Antibiotics    Social History   Socioeconomic History  . Marital status: Married    Spouse name: Not on file  . Number of children: Not on file  . Years of education: Not on file  . Highest education level: Not on file  Occupational History  . Not on file  Social Needs  . Financial resource strain: Not on file  . Food insecurity:    Worry: Not on file    Inability: Not on file  . Transportation needs:    Medical: Not on file    Non-medical: Not on file  Tobacco Use  . Smoking status: Never Smoker  . Smokeless tobacco: Never Used  Substance and Sexual Activity  . Alcohol use: Yes    Comment: On Occasion  . Drug use: No  . Sexual activity: Not Currently  Lifestyle  . Physical activity:    Days per week: Not on file    Minutes per session: Not on file  . Stress: Not on file  Relationships  . Social connections:    Talks on phone: Not on file    Gets together: Not on file    Attends religious service: Not on file    Active member of club or organization: Not on file    Attends meetings of clubs or organizations: Not on file    Relationship status: Not on file  . Intimate partner violence:    Fear of current or ex partner: Not on file    Emotionally abused: Not on file    Physically abused: Not on file    Forced sexual activity: Not on file  Other Topics Concern  . Not on file  Social History Narrative  . Not on file   Family History  Problem Relation Age of Onset  . Breast cancer Paternal Grandmother   . Cancer Paternal Grandmother        Breast  . Heart disease Paternal Grandmother   . Stroke Mother   . Arthritis Mother        Osteo and rheumatoid  . Hypertension Mother   . Angina Mother   . Cancer Father        Multiple Myeloma  . Gout Father   . Cancer Sister        Breast  . Heart disease Sister   . Cancer Daughter        Prostate  . Cancer Sister        Skin    Review of Systems  Constitutional: Negative.   HENT: Positive for congestion,  postnasal drip and rhinorrhea. Negative for dental problem, drooling, ear discharge, ear pain, facial swelling, hearing loss, mouth sores, nosebleeds, sinus pressure, sinus pain, sneezing, sore throat, tinnitus, trouble swallowing and voice change.   Respiratory: Positive for shortness of breath (occasionally for the past couple of months). Negative for apnea, cough, choking, chest tightness, wheezing and stridor.   Cardiovascular: Positive  for palpitations. Negative for chest pain and leg swelling.  Neurological: Positive for headaches.    Per HPI unless specifically indicated above     Objective:    BP 137/82 (BP Location: Left Arm, Patient Position: Sitting, Cuff Size: Normal)   Pulse 63   Temp 99.2 F (37.3 C)   Ht 5' 7.3" (1.709 m)   Wt 147 lb 9 oz (66.9 kg)   SpO2 97%   BMI 22.91 kg/m   Wt Readings from Last 3 Encounters:  05/25/18 147 lb 9 oz (66.9 kg)  02/03/16 142 lb (64.4 kg)    Physical Exam  Constitutional: She is oriented to person, place, and time. She appears well-developed and well-nourished. No distress.  HENT:  Head: Normocephalic and atraumatic.  Right Ear: Hearing normal.  Left Ear: Hearing normal.  Nose: Nose normal.  Eyes: Conjunctivae and lids are normal. Right eye exhibits no discharge. Left eye exhibits no discharge. No scleral icterus.  Cardiovascular: Normal rate, regular rhythm, normal heart sounds and intact distal pulses. Exam reveals no gallop and no friction rub.  No murmur heard. Pulmonary/Chest: Effort normal and breath sounds normal. No stridor. No respiratory distress. She has no wheezes. She has no rales. She exhibits no tenderness.  Musculoskeletal: Normal range of motion.  Neurological: She is alert and oriented to person, place, and time.  Skin: Skin is warm, dry and intact. Capillary refill takes less than 2 seconds. No rash noted. She is not diaphoretic. No erythema. No pallor.  Psychiatric: She has a normal mood and affect. Her speech  is normal and behavior is normal. Judgment and thought content normal. Cognition and memory are normal.  Nursing note and vitals reviewed.   No results found for this or any previous visit.    Assessment & Plan:   Problem List Items Addressed This Visit      Cardiovascular and Mediastinum   Hypertension - Primary    Under good control. Continue current regimen. Continue to monitor. Checking labs. Await results. Call with any concerns. Does not need refills at this time.       Relevant Orders   CBC with Differential/Platelet   Comprehensive metabolic panel   Urine Microalbumin w/creat. ratio   Urinalysis, Routine w reflex microscopic     Endocrine   Hypothyroidism    Under good control. Continue current regimen. Continue to monitor. Checking labs. Await results. Call with any concerns. Does not need refills at this time.       Relevant Orders   CBC with Differential/Platelet   Comprehensive metabolic panel   Thyroid Panel With TSH   Urinalysis, Routine w reflex microscopic     Musculoskeletal and Integument   Rheumatoid arthritis ([place])    Follows with Dr. Luane School. Continue to follow with them. Call with any concerns.       Relevant Orders   CBC with Differential/Platelet   Comprehensive metabolic panel   Sed Rate (ESR)   Urinalysis, Routine w reflex microscopic   Osteopenia    Other Visit Diagnoses    Encounter for hepatitis C screening test for low risk patient       Labs to be drawn at the hospital- ordered. Await results.    Relevant Orders   Hepatitis C Antibody   Screening for cholesterol level       Labs to be drawn at the hospital- ordered. Await results.    Relevant Orders   Lipid Profile   Screening for breast cancer  Mammogram ordered   Relevant Orders   MM DIGITAL SCREENING BILATERAL       Follow up plan: Return When able, for Physical and Wellness.

## 2018-05-25 NOTE — Assessment & Plan Note (Signed)
Follows with Dr. Baron Sane. Continue to follow with them. Call with any concerns.

## 2018-05-26 ENCOUNTER — Other Ambulatory Visit
Admission: RE | Admit: 2018-05-26 | Discharge: 2018-05-26 | Disposition: A | Discharge status: Home / Self Care | Payer: Medicare Other | Source: Ambulatory Visit | Attending: Family Medicine | Admitting: Family Medicine

## 2018-05-26 DIAGNOSIS — E039 Hypothyroidism, unspecified: Secondary | ICD-10-CM | POA: Diagnosis not present

## 2018-05-26 DIAGNOSIS — I10 Essential (primary) hypertension: Secondary | ICD-10-CM | POA: Diagnosis not present

## 2018-05-26 DIAGNOSIS — Z1159 Encounter for screening for other viral diseases: Secondary | ICD-10-CM | POA: Diagnosis not present

## 2018-05-26 DIAGNOSIS — M059 Rheumatoid arthritis with rheumatoid factor, unspecified: Secondary | ICD-10-CM

## 2018-05-26 DIAGNOSIS — Z13220 Encounter for screening for lipoid disorders: Secondary | ICD-10-CM | POA: Insufficient documentation

## 2018-05-26 LAB — LIPID PANEL
CHOL/HDL RATIO: 2.4 ratio
CHOLESTEROL: 177 mg/dL (ref 0–200)
HDL: 75 mg/dL (ref >40)
LDL CALC: 83 mg/dL (ref 0–99)
TRIGLYCERIDES: 95 mg/dL (ref <150)
VLDL: 19 mg/dL (ref 0–40)

## 2018-05-26 LAB — CBC WITH DIFFERENTIAL/PLATELET
Basophils Absolute: 0 10*3/uL (ref 0–0.1)
Basophils Relative: 1 %
Eosinophils Absolute: 0.1 10*3/uL (ref 0–0.7)
Eosinophils Relative: 3 %
HEMATOCRIT: 40.2 % (ref 35.0–47.0)
HEMOGLOBIN: 13.9 g/dL (ref 12.0–16.0)
LYMPHS ABS: 1.7 10*3/uL (ref 1.0–3.6)
LYMPHS PCT: 39 %
MCH: 32.1 pg (ref 26.0–34.0)
MCHC: 34.6 g/dL (ref 32.0–36.0)
MCV: 92.9 fL (ref 80.0–100.0)
MONO ABS: 0.3 10*3/uL (ref 0.2–0.9)
MONOS PCT: 7 %
NEUTROS PCT: 50 %
Neutro Abs: 2.2 10*3/uL (ref 1.4–6.5)
Platelets: 255 10*3/uL (ref 150–440)
RBC: 4.33 MIL/uL (ref 3.80–5.20)
RDW: 13.7 % (ref 11.5–14.5)
WBC: 4.3 10*3/uL (ref 3.6–11.0)

## 2018-05-26 LAB — COMPREHENSIVE METABOLIC PANEL
ALBUMIN: 4.4 g/dL (ref 3.5–5.0)
ALT: 19 U/L (ref 0–44)
ANION GAP: 9 (ref 5–15)
AST: 19 U/L (ref 15–41)
Alkaline Phosphatase: 52 U/L (ref 38–126)
BUN: 14 mg/dL (ref 8–23)
CHLORIDE: 102 mmol/L (ref 98–111)
CO2: 28 mmol/L (ref 22–32)
Calcium: 9.5 mg/dL (ref 8.9–10.3)
Creatinine, Ser: 0.81 mg/dL (ref 0.44–1.00)
GFR calc non Af Amer: >60 mL/min (ref >60)
GLUCOSE: 113 mg/dL — AB (ref 70–99)
Potassium: 4.2 mmol/L (ref 3.5–5.1)
SODIUM: 139 mmol/L (ref 135–145)
Total Bilirubin: 0.8 mg/dL (ref 0.3–1.2)
Total Protein: 7.3 g/dL (ref 6.5–8.1)

## 2018-05-26 LAB — URINALYSIS, ROUTINE W REFLEX MICROSCOPIC
Bacteria, UA: NONE SEEN
Bilirubin Urine: NEGATIVE
Glucose, UA: NEGATIVE mg/dL
Hgb urine dipstick: NEGATIVE
Ketones, ur: NEGATIVE mg/dL
Nitrite: NEGATIVE
PROTEIN: NEGATIVE mg/dL
SPECIFIC GRAVITY, URINE: 1.009 (ref 1.005–1.030)
pH: 8 (ref 5.0–8.0)

## 2018-05-26 LAB — SEDIMENTATION RATE: SED RATE: 5 mm/h (ref 0–30)

## 2018-05-27 LAB — THYROID PANEL WITH TSH
Free Thyroxine Index: 2 (ref 1.2–4.9)
T3 Uptake Ratio: 26 % (ref 24–39)
T4 TOTAL: 7.5 ug/dL (ref 4.5–12.0)
TSH: 4.81 u[IU]/mL — ABNORMAL HIGH (ref 0.450–4.500)

## 2018-05-27 LAB — MICROALBUMIN / CREATININE URINE RATIO
Creatinine, Urine: 54.5 mg/dL
Microalb Creat Ratio: 11 mg/g creat (ref 0.0–30.0)
Microalb, Ur: 6 ug/mL — ABNORMAL HIGH

## 2018-05-27 LAB — HEPATITIS C ANTIBODY

## 2018-05-29 ENCOUNTER — Encounter: Payer: Self-pay | Admitting: Family Medicine

## 2018-05-30 ENCOUNTER — Encounter: Payer: Self-pay | Admitting: Family Medicine

## 2018-06-05 ENCOUNTER — Encounter: Payer: Self-pay | Admitting: Family Medicine

## 2018-06-05 NOTE — Telephone Encounter (Signed)
Dr.Johnson, I am sending a copy of the notes to Dr.Kernolde, is there anything else that needs to be done.

## 2018-06-09 ENCOUNTER — Encounter: Payer: Self-pay | Admitting: Family Medicine

## 2018-06-14 ENCOUNTER — Ambulatory Visit
Admission: RE | Admit: 2018-06-14 | Discharge: 2018-06-14 | Disposition: A | Discharge status: Home / Self Care | Payer: Medicare Other | Source: Ambulatory Visit | Attending: Family Medicine | Admitting: Family Medicine

## 2018-06-14 DIAGNOSIS — Z1231 Encounter for screening mammogram for malignant neoplasm of breast: Secondary | ICD-10-CM | POA: Insufficient documentation

## 2018-06-14 DIAGNOSIS — Z1239 Encounter for other screening for malignant neoplasm of breast: Secondary | ICD-10-CM

## 2018-07-12 ENCOUNTER — Other Ambulatory Visit: Payer: Self-pay | Admitting: Family Medicine

## 2018-07-25 ENCOUNTER — Encounter: Payer: Self-pay | Admitting: Family Medicine

## 2018-07-25 ENCOUNTER — Ambulatory Visit (INDEPENDENT_AMBULATORY_CARE_PROVIDER_SITE_OTHER): Payer: Medicare Other | Admitting: Family Medicine

## 2018-07-25 ENCOUNTER — Other Ambulatory Visit: Payer: Self-pay

## 2018-07-25 VITALS — BP 128/73 | HR 58 | Temp 98.0°F | Ht 67.5 in | Wt 148.1 lb

## 2018-07-25 DIAGNOSIS — Z0000 Encounter for general adult medical examination without abnormal findings: Secondary | ICD-10-CM

## 2018-07-25 DIAGNOSIS — S61201A Unspecified open wound of left index finger without damage to nail, initial encounter: Secondary | ICD-10-CM

## 2018-07-25 DIAGNOSIS — Z23 Encounter for immunization: Secondary | ICD-10-CM | POA: Diagnosis not present

## 2018-07-25 DIAGNOSIS — Z13820 Encounter for screening for osteoporosis: Secondary | ICD-10-CM

## 2018-07-25 NOTE — Progress Notes (Signed)
BP 128/73   Pulse (!) 58   Temp 98 F (36.7 C) (Oral)   Ht 5' 7.5" (1.715 m)   Wt 148 lb 1.6 oz (67.2 kg)   SpO2 97%   BMI 22.85 kg/m    Subjective:    Patient ID: Barbara Tucker, female    DOB: 1946/09/15, 72 y.o.   MRN: [id number]  HPI: Barbara Tucker is a 72 y.o. female presenting on 07/25/2018 for comprehensive medical examination. Current medical complaints include:none  She currently lives with: her husband Menopausal Symptoms: yes- continues with hot flashes  Functional Status Survey: Is the patient deaf or have difficulty hearing?: No Does the patient have difficulty seeing, even when wearing glasses/contacts?: No Does the patient have difficulty concentrating, remembering, or making decisions?: Yes Does the patient have difficulty walking or climbing stairs?: No Does the patient have difficulty dressing or bathing?: No Does the patient have difficulty doing errands alone such as visiting a doctor's office or shopping?: No  Fall Risk  07/25/2018 05/25/2018  Falls in the past year? No No    Depression Screen Depression screen [facility] 2/9 07/25/2018 05/25/2018  Decreased Interest 1 1  Down, Depressed, Hopeless 1 0  PHQ - 2 Score 2 1  Altered sleeping 0 -  Tired, decreased energy 1 -  Change in appetite 0 -  Feeling bad or failure about yourself  0 -  Trouble concentrating 0 -  Moving slowly or fidgety/restless 0 -  Suicidal thoughts 0 -  PHQ-9 Score 3 -  Difficult doing work/chores Not difficult at all -    Past Medical History:  Past Medical History:  Diagnosis Date  . Allergic rhinitis   . Endometriosis   . Hypertension   . Hypothyroidism   . IBS (irritable bowel syndrome)   . Migraine   . Osteoarthritis   . Rheumatoid arthritis [facility])     Surgical History:  Past Surgical History:  Procedure Laterality Date  . ABDOMINAL HYSTERECTOMY    . APPENDECTOMY    . BREAST EXCISIONAL BIOPSY Right 1975   excisional - negative  . COLONOSCOPY WITH  PROPOFOL N/A 02/03/2016   Procedure: COLONOSCOPY WITH PROPOFOL;  Surgeon: Lollie Sails, MD;  Location: [facility] ENDOSCOPY;  Service: Endoscopy;  Laterality: N/A;  . OOPHORECTOMY Bilateral   . TONSILLECTOMY    . TUBAL LIGATION      Medications:  Current Outpatient Medications on File Prior to Visit  Medication Sig  . ascorbic acid (VITAMIN C) 500 MG tablet Take 500 mg by mouth daily.  . calcium carbonate (OSCAL) 1500 (600 Ca) MG TABS tablet Take 600 mg of elemental calcium by mouth 2 (two) times daily with a meal.  . Calcium Polycarbophil (FIBER-CAPS PO) Take by mouth.  . dicyclomine (BENTYL) 10 MG capsule Take 10 mg by mouth 4 (four) times daily.  Marland Kitchen estradiol (ESTRACE) 1 MG tablet Take 1 mg by mouth daily.  Marland Kitchen etodolac (LODINE) 400 MG tablet Take 400 mg by mouth 2 (two) times daily.  . fexofenadine (ALLEGRA) 180 MG tablet Take 180 mg by mouth daily.  . folic acid (FOLVITE) 1 MG tablet Take 1 mg by mouth daily.  Marland Kitchen guaiFENesin (MUCINEX) 600 MG 12 hr tablet Take by mouth 2 (two) times daily.  . hydroxychloroquine (PLAQUENIL) 200 MG tablet Take 200 mg by mouth 2 (two) times daily.  Marland Kitchen losartan (COZAAR) 50 MG tablet Take 50 mg by mouth daily.  . methotrexate (RHEUMATREX) 2.5 MG tablet Take 2.5 mg by mouth  2 (two) times daily.  . metoprolol succinate (TOPROL-XL) 25 MG 24 hr tablet TAKE ONE TABLET BY MOUTH EVERY DAY  . Multiple Vitamin (MULTIVITAMIN) tablet Take 1 tablet by mouth daily.  . Omega-3 Fatty Acids (OMEGA-3 EPA FISH OIL PO) Take 340-1,000 mg by mouth daily.  Marland Kitchen omeprazole (PRILOSEC) 20 MG capsule Take 20 mg by mouth daily.   No current facility-administered medications on file prior to visit.     Allergies:  Allergies  Allergen Reactions  . Codeine   . Lisinopril Other (See Comments)    Migraine  . Sulfa Antibiotics     Social History:  Social History   Socioeconomic History  . Marital status: Married    Spouse name: Not on file  . Number of children: Not on file  .  Years of education: Not on file  . Highest education level: Not on file  Occupational History  . Not on file  Social Needs  . Financial resource strain: Not on file  . Food insecurity:    Worry: Not on file    Inability: Not on file  . Transportation needs:    Medical: Not on file    Non-medical: Not on file  Tobacco Use  . Smoking status: Never Smoker  . Smokeless tobacco: Never Used  Substance and Sexual Activity  . Alcohol use: Yes    Comment: On Occasion  . Drug use: No  . Sexual activity: Not Currently  Lifestyle  . Physical activity:    Days per week: Not on file    Minutes per session: Not on file  . Stress: Not on file  Relationships  . Social connections:    Talks on phone: Not on file    Gets together: Not on file    Attends religious service: Not on file    Active member of club or organization: Not on file    Attends meetings of clubs or organizations: Not on file    Relationship status: Not on file  . Intimate partner violence:    Fear of current or ex partner: Not on file    Emotionally abused: Not on file    Physically abused: Not on file    Forced sexual activity: Not on file  Other Topics Concern  . Not on file  Social History Narrative  . Not on file   Social History   Tobacco Use  Smoking Status Never Smoker  Smokeless Tobacco Never Used   Social History   Substance and Sexual Activity  Alcohol Use Yes   Comment: On Occasion    Family History:  Family History  Problem Relation Age of Onset  . Breast cancer Paternal Grandmother   . Cancer Paternal Grandmother        Breast  . Heart disease Paternal Grandmother   . Stroke Mother   . Arthritis Mother        Osteo and rheumatoid  . Hypertension Mother   . Angina Mother   . Cancer Father        Multiple Myeloma  . Gout Father   . Cancer Sister        Breast  . Heart disease Sister   . Cancer Daughter        Prostate  . Cancer Sister        Skin    Past medical history,  surgical history, medications, allergies, family history and social history reviewed with patient today and changes made to appropriate areas of the chart.  Review of Systems  Constitutional: Negative.   HENT: Negative.   Eyes: Negative.   Respiratory: Positive for shortness of breath. Negative for cough, hemoptysis, sputum production and wheezing.   Cardiovascular: Negative.   Gastrointestinal: Positive for abdominal pain (epigastric over the weekend, resolved now) and heartburn. Negative for blood in stool, constipation, diarrhea, melena, nausea and vomiting.  Genitourinary: Negative.   Musculoskeletal: Positive for joint pain. Negative for back pain, falls, myalgias and neck pain.  Skin: Negative.   Neurological: Negative.   Endo/Heme/Allergies: Positive for environmental allergies. Negative for polydipsia. Does not bruise/bleed easily.  Psychiatric/Behavioral: Negative.     All other ROS negative except what is listed above and in the HPI.      Objective:    BP 128/73   Pulse (!) 58   Temp 98 F (36.7 C) (Oral)   Ht 5' 7.5" (1.715 m)   Wt 148 lb 1.6 oz (67.2 kg)   SpO2 97%   BMI 22.85 kg/m   Wt Readings from Last 3 Encounters:  07/25/18 148 lb 1.6 oz (67.2 kg)  05/25/18 147 lb 9 oz (66.9 kg)  02/03/16 142 lb (64.4 kg)    Physical Exam  Constitutional: She is oriented to person, place, and time. She appears well-developed and well-nourished. No distress.  HENT:  Head: Normocephalic and atraumatic.  Right Ear: Hearing, tympanic membrane, external ear and ear canal normal.  Left Ear: Hearing, tympanic membrane, external ear and ear canal normal.  Nose: Nose normal.  Mouth/Throat: Uvula is midline, oropharynx is clear and moist and mucous membranes are normal. No oropharyngeal exudate.  Eyes: Pupils are equal, round, and reactive to light. Conjunctivae, EOM and lids are normal. Right eye exhibits no discharge. Left eye exhibits no discharge. No scleral icterus.  Neck:  Normal range of motion. Neck supple. No JVD present. No tracheal deviation present. No thyromegaly present.  Cardiovascular: Normal rate, regular rhythm, normal heart sounds and intact distal pulses. Exam reveals no gallop and no friction rub.  No murmur heard. Pulmonary/Chest: Effort normal and breath sounds normal. No stridor. No respiratory distress. She has no wheezes. She has no rales. She exhibits no tenderness.  Abdominal: Soft. Bowel sounds are normal. She exhibits no distension and no mass. There is no tenderness. There is no rebound and no guarding. No hernia.  Genitourinary:  Genitourinary Comments: Breast and pelvic exam deferred with shared decision making  Musculoskeletal: Normal range of motion. She exhibits no edema, tenderness or deformity.  Lymphadenopathy:    She has no cervical adenopathy.  Neurological: She is alert and oriented to person, place, and time. She displays normal reflexes. No cranial nerve deficit or sensory deficit. She exhibits normal muscle tone. Coordination normal.  Skin: Skin is warm, dry and intact. Capillary refill takes less than 2 seconds. No rash noted. She is not diaphoretic. No erythema. No pallor.  Well healing wound on L index finger  Psychiatric: She has a normal mood and affect. Her speech is normal and behavior is normal. Judgment and thought content normal. Cognition and memory are normal.  Nursing note and vitals reviewed.   6CIT Screen 07/25/2018  What Year? 0 points  What month? 0 points  What time? 0 points  Count back from 20 0 points  Months in reverse 0 points  Repeat phrase 0 points  Total Score 0     Results for orders placed or performed during the hospital encounter of 05/26/18  Urinalysis, Routine w reflex microscopic  Result Value Ref  Range   Color, Urine YELLOW (A) YELLOW   APPearance HAZY (A) CLEAR   Specific Gravity, Urine 1.009 1.005 - 1.030   pH 8.0 5.0 - 8.0   Glucose, UA NEGATIVE NEGATIVE mg/dL   Hgb urine  dipstick NEGATIVE NEGATIVE   Bilirubin Urine NEGATIVE NEGATIVE   Ketones, ur NEGATIVE NEGATIVE mg/dL   Protein, ur NEGATIVE NEGATIVE mg/dL   Nitrite NEGATIVE NEGATIVE   Leukocytes, UA LARGE (A) NEGATIVE   RBC / HPF 0-5 0 - 5 RBC/hpf   WBC, UA >50 (H) 0 - 5 WBC/hpf   Bacteria, UA NONE SEEN NONE SEEN   Squamous Epithelial / LPF 6-10 0 - 5  Lipid Profile  Result Value Ref Range   Cholesterol 177 0 - 200 mg/dL   Triglycerides 95 <150 mg/dL   HDL 75 >40 mg/dL   Total CHOL/HDL Ratio 2.4 RATIO   VLDL 19 0 - 40 mg/dL   LDL Cholesterol 83 0 - 99 mg/dL  Sed Rate (ESR)  Result Value Ref Range   Sed Rate 5 0 - 30 mm/hr  Hepatitis C Antibody  Result Value Ref Range   HCV Ab <0.1 0.0 - 0.9 s/co ratio  Urine Microalbumin w/creat. ratio  Result Value Ref Range   Microalb, Ur 6.0 (H) Not Estab. ug/mL   Microalb Creat Ratio 11.0 0.0 - 30.0 mg/g creat   Creatinine, Urine 54.5 Not Estab. mg/dL  Thyroid Panel With TSH  Result Value Ref Range   TSH 4.810 (H) 0.450 - 4.500 uIU/mL   T4, Total 7.5 4.5 - 12.0 ug/dL   T3 Uptake Ratio 26 24 - 39 %   Free Thyroxine Index 2.0 1.2 - 4.9  Comprehensive metabolic panel  Result Value Ref Range   Sodium 139 135 - 145 mmol/L   Potassium 4.2 3.5 - 5.1 mmol/L   Chloride 102 98 - 111 mmol/L   CO2 28 22 - 32 mmol/L   Glucose, Bld 113 (H) 70 - 99 mg/dL   BUN 14 8 - 23 mg/dL   Creatinine, Ser 0.81 0.44 - 1.00 mg/dL   Calcium 9.5 8.9 - 10.3 mg/dL   Total Protein 7.3 6.5 - 8.1 g/dL   Albumin 4.4 3.5 - 5.0 g/dL   AST 19 15 - 41 U/L   ALT 19 0 - 44 U/L   Alkaline Phosphatase 52 38 - 126 U/L   Total Bilirubin 0.8 0.3 - 1.2 mg/dL   GFR calc non Af Amer >60 >60 mL/min   GFR calc Af Amer >60 >60 mL/min   Anion gap 9 5 - 15  CBC with Differential/Platelet  Result Value Ref Range   WBC 4.3 3.6 - 11.0 K/uL   RBC 4.33 3.80 - 5.20 MIL/uL   Hemoglobin 13.9 12.0 - 16.0 g/dL   HCT 40.2 35.0 - 47.0 %   MCV 92.9 80.0 - 100.0 fL   MCH 32.1 26.0 - 34.0 pg   MCHC  34.6 32.0 - 36.0 g/dL   RDW 13.7 11.5 - 14.5 %   Platelets 255 150 - 440 K/uL   Neutrophils Relative % 50 %   Neutro Abs 2.2 1.4 - 6.5 K/uL   Lymphocytes Relative 39 %   Lymphs Abs 1.7 1.0 - 3.6 K/uL   Monocytes Relative 7 %   Monocytes Absolute 0.3 0.2 - 0.9 K/uL   Eosinophils Relative 3 %   Eosinophils Absolute 0.1 0 - 0.7 K/uL   Basophils Relative 1 %   Basophils Absolute 0.0 0 - 0.1  K/uL      Assessment & Plan:   Problem List Items Addressed This Visit    None    Visit Diagnoses    Medicare annual wellness visit, subsequent    -  Primary   Preventative care discussed today as below.    Routine general medical examination at a health care facility       Vaccines updated. Screening labs checked last visit. DEXA ordered. Mammogram and colonoscopy up to date. Call with any concerns.    Screening for osteoporosis       DEXA ordered today.   Relevant Orders   DG Bone Density   Immunization due       Pneumovax given today.   Relevant Orders   Pneumococcal polysaccharide vaccine 23-valent greater than or equal to 2yo subcutaneous/IM   Open wound of left index finger without damage to nail, initial encounter       Healing well. Due for Td- given today.   Relevant Orders   Td : Tetanus/diphtheria >7yo Preservative  free       Preventative Services:  Health Risk Assessment and Personalized Prevention Plan: Done Bone Mass Measurements: Ordered today Breast Cancer Screening: UTD CVD Screening: UTD Cervical Cancer Screening: N/A Colon Cancer Screening: UTD Depression Screening: Done today Diabetes Screening: UTD Glaucoma Screening: See your eye doctor Hepatitis B vaccine: N/A Hepatitis C screening: Up to date HIV Screening: Up to date Flu Vaccine: Refused Lung cancer Screening: N/A Obesity Screening: Done today Pneumonia Vaccines (2): Done today STI Screening: N/A  Follow up plan: Return in about 6 months (around 01/23/2019) for 6 month follow up.   LABORATORY  TESTING:  - Pap smear: not applicable  IMMUNIZATIONS:   - Tdap: Tetanus vaccination status reviewed: Td vaccination indicated and given today. - Influenza: Refused - Pneumovax: Administered today - Prevnar: Up to date - Zostavax vaccine: Up to date  SCREENING: -Mammogram: Up to date  - Colonoscopy: Up to date  - Bone Density: Ordered today   PATIENT COUNSELING:   Advised to take 1 mg of folate supplement per day if capable of pregnancy.   Sexuality: Discussed sexually transmitted diseases, partner selection, use of condoms, avoidance of unintended pregnancy  and contraceptive alternatives.   Advised to avoid cigarette smoking.  I discussed with the patient that most people either abstain from alcohol or drink within safe limits (<=14/week and <=4 drinks/occasion for males, <=7/weeks and <= 3 drinks/occasion for females) and that the risk for alcohol disorders and other health effects rises proportionally with the number of drinks per week and how often a drinker exceeds daily limits.  Discussed cessation/primary prevention of drug use and availability of treatment for abuse.   Diet: Encouraged to adjust caloric intake to maintain  or achieve ideal body weight, to reduce intake of dietary saturated fat and total fat, to limit sodium intake by avoiding high sodium foods and not adding table salt, and to maintain adequate dietary potassium and calcium preferably from fresh fruits, vegetables, and low-fat dairy products.    stressed the importance of regular exercise  Injury prevention: Discussed safety belts, safety helmets, smoke detector, smoking near bedding or upholstery.   Dental health: Discussed importance of regular tooth brushing, flossing, and dental visits.    NEXT PREVENTATIVE PHYSICAL DUE IN 1 YEAR. Return in about 6 months (around 01/23/2019) for 6 month follow up.

## 2018-07-25 NOTE — Patient Instructions (Addendum)
Preventative Services:  Health Risk Assessment and Personalized Prevention Plan: Done Bone Mass Measurements: Ordered today Breast Cancer Screening: UTD CVD Screening: UTD Cervical Cancer Screening: N/A Colon Cancer Screening: UTD Depression Screening: Done today Diabetes Screening: UTD Glaucoma Screening: See your eye doctor Hepatitis B vaccine: N/A Hepatitis C screening: Up to date HIV Screening: Up to date Flu Vaccine: Refused Lung cancer Screening: N/A Obesity Screening: Done today Pneumonia Vaccines (2): Done today STI Screening: N/A   Health Maintenance for Postmenopausal Women Menopause is a normal process in which your reproductive ability comes to an end. This process happens gradually over a span of months to years, usually between the ages of 21 and 67. Menopause is complete when you have missed 12 consecutive menstrual periods. It is important to talk with your health care provider about some of the most common conditions that affect postmenopausal women, such as heart disease, cancer, and bone loss (osteoporosis). Adopting a healthy lifestyle and getting preventive care can help to promote your health and wellness. Those actions can also lower your chances of developing some of these common conditions. What should I know about menopause? During menopause, you may experience a number of symptoms, such as:  Moderate-to-severe hot flashes.  Night sweats.  Decrease in sex drive.  Mood swings.  Headaches.  Tiredness.  Irritability.  Memory problems.  Insomnia.  Choosing to treat or not to treat menopausal changes is an individual decision that you make with your health care provider. What should I know about hormone replacement therapy and supplements? Hormone therapy products are effective for treating symptoms that are associated with menopause, such as hot flashes and night sweats. Hormone replacement carries certain risks, especially as you become older. If  you are thinking about using estrogen or estrogen with progestin treatments, discuss the benefits and risks with your health care provider. What should I know about heart disease and stroke? Heart disease, heart attack, and stroke become more likely as you age. This may be due, in part, to the hormonal changes that your body experiences during menopause. These can affect how your body processes dietary fats, triglycerides, and cholesterol. Heart attack and stroke are both medical emergencies. There are many things that you can do to help prevent heart disease and stroke:  Have your blood pressure checked at least every 1-2 years. High blood pressure causes heart disease and increases the risk of stroke.  If you are 61-78 years old, ask your health care provider if you should take aspirin to prevent a heart attack or a stroke.  Do not use any tobacco products, including cigarettes, chewing tobacco, or electronic cigarettes. If you need help quitting, ask your health care provider.  It is important to eat a healthy diet and maintain a healthy weight. ? Be sure to include plenty of vegetables, fruits, low-fat dairy products, and lean protein. ? Avoid eating foods that are high in solid fats, added sugars, or salt (sodium).  Get regular exercise. This is one of the most important things that you can do for your health. ? Try to exercise for at least 150 minutes each week. The type of exercise that you do should increase your heart rate and make you sweat. This is known as moderate-intensity exercise. ? Try to do strengthening exercises at least twice each week. Do these in addition to the moderate-intensity exercise.  Know your numbers.Ask your health care provider to check your cholesterol and your blood glucose. Continue to have your blood tested as directed  by your health care provider.  What should I know about cancer screening? There are several types of cancer. Take the following steps to  reduce your risk and to catch any cancer development as early as possible. Breast Cancer  Practice breast self-awareness. ? This means understanding how your breasts normally appear and feel. ? It also means doing regular breast self-exams. Let your health care provider know about any changes, no matter how small.  If you are 34 or older, have a clinician do a breast exam (clinical breast exam or CBE) every year. Depending on your age, family history, and medical history, it may be recommended that you also have a yearly breast X-ray (mammogram).  If you have a family history of breast cancer, talk with your health care provider about genetic screening.  If you are at high risk for breast cancer, talk with your health care provider about having an MRI and a mammogram every year.  Breast cancer (BRCA) gene test is recommended for women who have family members with BRCA-related cancers. Results of the assessment will determine the need for genetic counseling and BRCA1 and for BRCA2 testing. BRCA-related cancers include these types: ? Breast. This occurs in males or females. ? Ovarian. ? Tubal. This may also be called fallopian tube cancer. ? Cancer of the abdominal or pelvic lining (peritoneal cancer). ? Prostate. ? Pancreatic.  Cervical, Uterine, and Ovarian Cancer Your health care provider may recommend that you be screened regularly for cancer of the pelvic organs. These include your ovaries, uterus, and vagina. This screening involves a pelvic exam, which includes checking for microscopic changes to the surface of your cervix (Pap test).  For women ages 21-65, health care providers may recommend a pelvic exam and a Pap test every three years. For women ages 41-65, they may recommend the Pap test and pelvic exam, combined with testing for human papilloma virus (HPV), every five years. Some types of HPV increase your risk of cervical cancer. Testing for HPV may also be done on women of any  age who have unclear Pap test results.  Other health care providers may not recommend any screening for nonpregnant women who are considered low risk for pelvic cancer and have no symptoms. Ask your health care provider if a screening pelvic exam is right for you.  If you have had past treatment for cervical cancer or a condition that could lead to cancer, you need Pap tests and screening for cancer for at least 20 years after your treatment. If Pap tests have been discontinued for you, your risk factors (such as having a new sexual partner) need to be reassessed to determine if you should start having screenings again. Some women have medical problems that increase the chance of getting cervical cancer. In these cases, your health care provider may recommend that you have screening and Pap tests more often.  If you have a family history of uterine cancer or ovarian cancer, talk with your health care provider about genetic screening.  If you have vaginal bleeding after reaching menopause, tell your health care provider.  There are currently no reliable tests available to screen for ovarian cancer.  Lung Cancer Lung cancer screening is recommended for adults 22-4 years old who are at high risk for lung cancer because of a history of smoking. A yearly low-dose CT scan of the lungs is recommended if you:  Currently smoke.  Have a history of at least 30 pack-years of smoking and you currently smoke  or have quit within the past 15 years. A pack-year is smoking an average of one pack of cigarettes per day for one year.  Yearly screening should:  Continue until it has been 15 years since you quit.  Stop if you develop a health problem that would prevent you from having lung cancer treatment.  Colorectal Cancer  This type of cancer can be detected and can often be prevented.  Routine colorectal cancer screening usually begins at age 83 and continues through age 71.  If you have risk factors  for colon cancer, your health care provider may recommend that you be screened at an earlier age.  If you have a family history of colorectal cancer, talk with your health care provider about genetic screening.  Your health care provider may also recommend using home test kits to check for hidden blood in your stool.  A small camera at the end of a tube can be used to examine your colon directly (sigmoidoscopy or colonoscopy). This is done to check for the earliest forms of colorectal cancer.  Direct examination of the colon should be repeated every 5-10 years until age 38. However, if early forms of precancerous polyps or small growths are found or if you have a family history or genetic risk for colorectal cancer, you may need to be screened more often.  Skin Cancer  Check your skin from head to toe regularly.  Monitor any moles. Be sure to tell your health care provider: ? About any new moles or changes in moles, especially if there is a change in a mole's shape or color. ? If you have a mole that is larger than the size of a pencil eraser.  If any of your family members has a history of skin cancer, especially at a young age, talk with your health care provider about genetic screening.  Always use sunscreen. Apply sunscreen liberally and repeatedly throughout the day.  Whenever you are outside, protect yourself by wearing long sleeves, pants, a wide-brimmed hat, and sunglasses.  What should I know about osteoporosis? Osteoporosis is a condition in which bone destruction happens more quickly than new bone creation. After menopause, you may be at an increased risk for osteoporosis. To help prevent osteoporosis or the bone fractures that can happen because of osteoporosis, the following is recommended:  If you are 21-47 years old, get at least 1,000 mg of calcium and at least 600 mg of vitamin D per day.  If you are older than age 11 but younger than age 25, get at least 1,200 mg of  calcium and at least 600 mg of vitamin D per day.  If you are older than age 74, get at least 1,200 mg of calcium and at least 800 mg of vitamin D per day.  Smoking and excessive alcohol intake increase the risk of osteoporosis. Eat foods that are rich in calcium and vitamin D, and do weight-bearing exercises several times each week as directed by your health care provider. What should I know about how menopause affects my mental health? Depression may occur at any age, but it is more common as you become older. Common symptoms of depression include:  Low or sad mood.  Changes in sleep patterns.  Changes in appetite or eating patterns.  Feeling an overall lack of motivation or enjoyment of activities that you previously enjoyed.  Frequent crying spells.  Talk with your health care provider if you think that you are experiencing depression. What should I  know about immunizations? It is important that you get and maintain your immunizations. These include:  Tetanus, diphtheria, and pertussis (Tdap) booster vaccine.  Influenza every year before the flu season begins.  Pneumonia vaccine.  Shingles vaccine.  Your health care provider may also recommend other immunizations. This information is not intended to replace advice given to you by your health care provider. Make sure you discuss any questions you have with your health care provider. Document Released: 12/17/2005 Document Revised: 05/14/2016 Document Reviewed: 07/29/2015 Elsevier Interactive Patient Education  2018 [place]. Pneumococcal Conjugate Vaccine (PCV13) What You Need to Know 1. Why get vaccinated? Vaccination can protect both children and adults from pneumococcal disease. Pneumococcal disease is caused by bacteria that can spread from person to person through close contact. It can cause ear infections, and it can also lead to more serious infections of the:  Lungs (pneumonia),  Blood (bacteremia),  and  Covering of the brain and spinal cord (meningitis).  Pneumococcal pneumonia is most common among adults. Pneumococcal meningitis can cause deafness and brain damage, and it kills about 1 child in 10 who get it. Anyone can get pneumococcal disease, but children under 46 years of age and adults 23 years and older, people with certain medical conditions, and cigarette smokers are at the highest risk. Before there was a vaccine, the [place] saw:  more than 700 cases of meningitis,  about 13,000 blood infections,  about 5 million ear infections, and  about 200 deaths  in children under 5 each year from pneumococcal disease. Since vaccine became available, severe pneumococcal disease in these children has fallen by 88%. About 18,000 older adults die of pneumococcal disease each year in the [place]. Treatment of pneumococcal infections with penicillin and other drugs is not as effective as it used to be, because some strains of the disease have become resistant to these drugs. This makes prevention of the disease, through vaccination, even more important. 2. PCV13 vaccine Pneumococcal conjugate vaccine (called PCV13) protects against 13 types of pneumococcal bacteria. PCV13 is routinely given to children at 2, 4, 6, and 69-66 months of age. It is also recommended for children and adults 66 to 50 years of age with certain health conditions, and for all adults 51 years of age and older. Your doctor can give you details. 3. Some people should not get this vaccine Anyone who has ever had a life-threatening allergic reaction to a dose of this vaccine, to an earlier pneumococcal vaccine called PCV7, or to any vaccine containing diphtheria toxoid (for example, DTaP), should not get PCV13. Anyone with a severe allergy to any component of PCV13 should not get the vaccine. Tell your doctor if the person being vaccinated has any severe allergies. If the person scheduled for vaccination is not  feeling well, your healthcare provider might decide to reschedule the shot on another day. 4. Risks of a vaccine reaction With any medicine, including vaccines, there is a chance of reactions. These are usually mild and go away on their own, but serious reactions are also possible. Problems reported following PCV13 varied by age and dose in the series. The most common problems reported among children were:  About half became drowsy after the shot, had a temporary loss of appetite, or had redness or tenderness where the shot was given.  About 1 out of 3 had swelling where the shot was given.  About 1 out of 3 had a mild fever, and about 1 in 20 had a  fever over 102.71F.  Up to about 8 out of 10 became fussy or irritable.  Adults have reported pain, redness, and swelling where the shot was given; also mild fever, fatigue, headache, chills, or muscle pain. Young children who get PCV13 along with inactivated flu vaccine at the same time may be at increased risk for seizures caused by fever. Ask your doctor for more information. Problems that could happen after any vaccine:  People sometimes faint after a medical procedure, including vaccination. Sitting or lying down for about 15 minutes can help prevent fainting, and injuries caused by a fall. Tell your doctor if you feel dizzy, or have vision changes or ringing in the ears.  Some older children and adults get severe pain in the shoulder and have difficulty moving the arm where a shot was given. This happens very rarely.  Any medication can cause a severe allergic reaction. Such reactions from a vaccine are very rare, estimated at about 1 in a million doses, and would happen within a few minutes to a few hours after the vaccination. As with any medicine, there is a very small chance of a vaccine causing a serious injury or death. The safety of vaccines is always being monitored. For more information, visit: http://www.aguilar.org/ 5. What if  there is a serious reaction? What should I look for? Look for anything that concerns you, such as signs of a severe allergic reaction, very high fever, or unusual behavior. Signs of a severe allergic reaction can include hives, swelling of the face and throat, difficulty breathing, a fast heartbeat, dizziness, and weakness-usually within a few minutes to a few hours after the vaccination. What should I do?  If you think it is a severe allergic reaction or other emergency that can't wait, call 9-1-1 or get the person to the nearest hospital. Otherwise, call your doctor.  Reactions should be reported to the Vaccine Adverse Event Reporting System (VAERS). Your doctor should file this report, or you can do it yourself through the VAERS web site at www.vaers.SamedayNews.es, or by calling 870-053-2403. ? VAERS does not give medical advice. 6. The National Vaccine Injury Compensation Program The [employer] Vaccine Injury Compensation Program (VICP) is a federal program that was created to compensate people who may have been injured by certain vaccines. Persons who believe they may have been injured by a vaccine can learn about the program and about filing a claim by calling (516) 579-2484 or visiting the [place] website at GoldCloset.com.ee. There is a time limit to file a claim for compensation. 7. How can I learn more?  Ask your healthcare provider. He or she can give you the vaccine package insert or suggest other sources of information.  Call your local or state health department.  Contact the Centers for Disease Control and Prevention (CDC): ? Call (201)258-3488 (1-800-CDC-INFO) or ? Visit CDC's website at http://hunter.com/ Vaccine Information Statement, PCV13 Vaccine (09/12/2014) This information is not intended to replace advice given to you by your health care provider. Make sure you discuss any questions you have with your health care provider. Document Released: 08/22/2006  Document Revised: 07/15/2016 Document Reviewed: 07/15/2016 Elsevier Interactive Patient Education  2017 [place]. Pneumococcal Polysaccharide Vaccine: What You Need to Know 1. Why get vaccinated? Vaccination can protect older adults (and some children and younger adults) from pneumococcal disease. Pneumococcal disease is caused by bacteria that can spread from person to person through close contact. It can cause ear infections, and it can also lead to more serious infections  of the:  Lungs (pneumonia),  Blood (bacteremia), and  Covering of the brain and spinal cord (meningitis). Meningitis can cause deafness and brain damage, and it can be fatal.  Anyone can get pneumococcal disease, but children under 73 years of age, people with certain medical conditions, adults over 4 years of age, and cigarette smokers are at the highest risk. About 18,000 older adults die each year from pneumococcal disease in the [place]. Treatment of pneumococcal infections with penicillin and other drugs used to be more effective. But some strains of the disease have become resistant to these drugs. This makes prevention of the disease, through vaccination, even more important. 2. Pneumococcal polysaccharide vaccine (PPSV23) Pneumococcal polysaccharide vaccine (PPSV23) protects against 23 types of pneumococcal bacteria. It will not prevent all pneumococcal disease. PPSV23 is recommended for:  All adults 42 years of age and older,  Anyone 2 through 72 years of age with certain long-term health problems,  Anyone 2 through 72 years of age with a weakened immune system,  Adults 57 through 72 years of age who smoke cigarettes or have asthma.  Most people need only one dose of PPSV. A second dose is recommended for certain high-risk groups. People 42 and older should get a dose even if they have gotten one or more doses of the vaccine before they turned 65. Your healthcare provider can give you more  information about these recommendations. Most healthy adults develop protection within 2 to 3 weeks of getting the shot. 3. Some people should not get this vaccine  Anyone who has had a life-threatening allergic reaction to PPSV should not get another dose.  Anyone who has a severe allergy to any component of PPSV should not receive it. Tell your provider if you have any severe allergies.  Anyone who is moderately or severely ill when the shot is scheduled may be asked to wait until they recover before getting the vaccine. Someone with a mild illness can usually be vaccinated.  Children less than 32 years of age should not receive this vaccine.  There is no evidence that PPSV is harmful to either a pregnant woman or to her fetus. However, as a precaution, women who need the vaccine should be vaccinated before becoming pregnant, if possible. 4. Risks of a vaccine reaction With any medicine, including vaccines, there is a chance of side effects. These are usually mild and go away on their own, but serious reactions are also possible. About half of people who get PPSV have mild side effects, such as redness or pain where the shot is given, which go away within about two days. Less than 1 out of 100 people develop a fever, muscle aches, or more severe local reactions. Problems that could happen after any vaccine:  People sometimes faint after a medical procedure, including vaccination. Sitting or lying down for about 15 minutes can help prevent fainting, and injuries caused by a fall. Tell your doctor if you feel dizzy, or have vision changes or ringing in the ears.  Some people get severe pain in the shoulder and have difficulty moving the arm where a shot was given. This happens very rarely.  Any medication can cause a severe allergic reaction. Such reactions from a vaccine are very rare, estimated at about 1 in a million doses, and would happen within a few minutes to a few hours after the  vaccination. As with any medicine, there is a very remote chance of a vaccine causing a serious injury or  death. The safety of vaccines is always being monitored. For more information, visit: http://www.aguilar.org/ 5. What if there is a serious reaction? What should I look for? Look for anything that concerns you, such as signs of a severe allergic reaction, very high fever, or unusual behavior. Signs of a severe allergic reaction can include hives, swelling of the face and throat, difficulty breathing, a fast heartbeat, dizziness, and weakness. These would usually start a few minutes to a few hours after the vaccination. What should I do? If you think it is a severe allergic reaction or other emergency that can't wait, call 9-1-1 or get to the nearest hospital. Otherwise, call your doctor. Afterward, the reaction should be reported to the Vaccine Adverse Event Reporting System (VAERS). Your doctor might file this report, or you can do it yourself through the VAERS web site at www.vaers.SamedayNews.es, or by calling 613-399-0851. VAERS does not give medical advice. 6. How can I learn more?  Ask your doctor. He or she can give you the vaccine package insert or suggest other sources of information.  Call your local or state health department.  Contact the Centers for Disease Control and Prevention (CDC): ? Call 234-206-8223 (1-800-CDC-INFO) or ? Visit CDC's website at http://hunter.com/ CDC Pneumococcal Polysaccharide Vaccine VIS (03/01/14) This information is not intended to replace advice given to you by your health care provider. Make sure you discuss any questions you have with your health care provider. Document Released: 08/22/2006 Document Revised: 07/15/2016 Document Reviewed: 07/15/2016 Elsevier Interactive Patient Education  2017 [place] (Tetanus and Diphtheria): What You Need to Know 1. Why get vaccinated? Tetanus  and diphtheria are very serious diseases. They  are rare in the [place] today, but people who do become infected often have severe complications. Td vaccine is used to protect adolescents and adults from both of these diseases. Both tetanus and diphtheria are infections caused by bacteria. Diphtheria spreads from person to person through coughing or sneezing. Tetanus-causing bacteria enter the body through cuts, scratches, or wounds. TETANUS (lockjaw) causes painful muscle tightening and stiffness, usually all over the body.  It can lead to tightening of muscles in the head and neck so you can't open your mouth, swallow, or sometimes even breathe. Tetanus kills about 1 out of every 10 people who are infected even after receiving the best medical care.  DIPHTHERIA can cause a thick coating to form in the back of the throat.  It can lead to breathing problems, paralysis, heart failure, and death.  Before vaccines, as many as 200,000 cases of diphtheria and hundreds of cases of tetanus were reported in the [place] each year. Since vaccination began, reports of cases for both diseases have dropped by about 99%. 2. Td vaccine Td vaccine can protect adolescents and adults from tetanus and diphtheria. Td is usually given as a booster dose every 10 years but it can also be given earlier after a severe and dirty wound or burn. Another vaccine, called Tdap, which protects against pertussis in addition to tetanus and diphtheria, is sometimes recommended instead of Td vaccine. Your doctor or the person giving you the vaccine can give you more information. Td may safely be given at the same time as other vaccines. 3. Some people should not get this vaccine  A person who has ever had a life-threatening allergic reaction after a previous dose of any tetanus or diphtheria containing vaccine, OR has a severe allergy to any part of this vaccine, should not  get Td vaccine. Tell the person giving the vaccine about any severe allergies.  Talk to your  doctor if you: ? had severe pain or swelling after any vaccine containing diphtheria or tetanus, ? ever had a condition called Guillain Barre Syndrome (GBS), ? aren't feeling well on the day the shot is scheduled. 4. What are the risks from Td vaccine? With any medicine, including vaccines, there is a chance of side effects. These are usually mild and go away on their own. Serious reactions are also possible but are rare. Most people who get Td vaccine do not have any problems with it. Mild problems following Td vaccine: (Did not interfere with activities)  Pain where the shot was given (about 8 people in 10)  Redness or swelling where the shot was given (about 1 person in 4)  Mild fever (rare)  Headache (about 1 person in 4)  Tiredness (about 1 person in 4)  Moderate problems following Td vaccine: (Interfered with activities, but did not require medical attention)  Fever over 102F (rare)  Severe problems following Td vaccine: (Unable to perform usual activities; required medical attention)  Swelling, severe pain, bleeding and/or redness in the arm where the shot was given (rare).  Problems that could happen after any vaccine:  People sometimes faint after a medical procedure, including vaccination. Sitting or lying down for about 15 minutes can help prevent fainting, and injuries caused by a fall. Tell your doctor if you feel dizzy, or have vision changes or ringing in the ears.  Some people get severe pain in the shoulder and have difficulty moving the arm where a shot was given. This happens very rarely.  Any medication can cause a severe allergic reaction. Such reactions from a vaccine are very rare, estimated at fewer than 1 in a million doses, and would happen within a few minutes to a few hours after the vaccination. As with any medicine, there is a very remote chance of a vaccine causing a serious injury or death. The safety of vaccines is always being monitored. For  more information, visit: http://www.aguilar.org/ 5. What if there is a serious reaction? What should I look for? Look for anything that concerns you, such as signs of a severe allergic reaction, very high fever, or unusual behavior. Signs of a severe allergic reaction can include hives, swelling of the face and throat, difficulty breathing, a fast heartbeat, dizziness, and weakness. These would usually start a few minutes to a few hours after the vaccination. What should I do?  If you think it is a severe allergic reaction or other emergency that can't wait, call 9-1-1 or get the person to the nearest hospital. Otherwise, call your doctor.  Afterward, the reaction should be reported to the Vaccine Adverse Event Reporting System (VAERS). Your doctor might file this report, or you can do it yourself through the VAERS web site at www.vaers.SamedayNews.es, or by calling 847-582-5705. ? VAERS does not give medical advice. 6. The National Vaccine Injury Compensation Program The [employer] Vaccine Injury Compensation Program (VICP) is a federal program that was created to compensate people who may have been injured by certain vaccines. Persons who believe they may have been injured by a vaccine can learn about the program and about filing a claim by calling (619)151-2223 or visiting the [place] website at GoldCloset.com.ee. There is a time limit to file a claim for compensation. 7. How can I learn more?  Ask your doctor. He or she can give you the vaccine  package insert or suggest other sources of information.  Call your local or state health department.  Contact the Centers for Disease Control and Prevention (CDC): ? Call 815-154-1535 (1-800-CDC-INFO) ? Visit CDC's website at http://hunter.com/ CDC Td Vaccine VIS (02/17/16) This information is not intended to replace advice given to you by your health care provider. Make sure you discuss any questions you have with your health care  provider. Document Released: 08/22/2006 Document Revised: 07/15/2016 Document Reviewed: 07/15/2016 Elsevier Interactive Patient Education  2017 [employer].

## 2018-08-08 DIAGNOSIS — Z23 Encounter for immunization: Secondary | ICD-10-CM | POA: Diagnosis not present

## 2018-09-01 DIAGNOSIS — Z711 Person with feared health complaint in whom no diagnosis is made: Secondary | ICD-10-CM | POA: Diagnosis not present

## 2018-09-01 DIAGNOSIS — D485 Neoplasm of uncertain behavior of skin: Secondary | ICD-10-CM | POA: Diagnosis not present

## 2018-09-05 DIAGNOSIS — Z79899 Other long term (current) drug therapy: Secondary | ICD-10-CM | POA: Diagnosis not present

## 2018-09-05 DIAGNOSIS — M150 Primary generalized (osteo)arthritis: Secondary | ICD-10-CM | POA: Diagnosis not present

## 2018-09-05 DIAGNOSIS — M059 Rheumatoid arthritis with rheumatoid factor, unspecified: Secondary | ICD-10-CM | POA: Diagnosis not present

## 2018-09-08 DIAGNOSIS — Z79899 Other long term (current) drug therapy: Secondary | ICD-10-CM | POA: Diagnosis not present

## 2018-09-08 DIAGNOSIS — M069 Rheumatoid arthritis, unspecified: Secondary | ICD-10-CM | POA: Diagnosis not present

## 2018-09-12 DIAGNOSIS — M545 Low back pain: Secondary | ICD-10-CM | POA: Diagnosis not present

## 2018-09-12 DIAGNOSIS — G8929 Other chronic pain: Secondary | ICD-10-CM | POA: Diagnosis not present

## 2018-09-12 DIAGNOSIS — Z79899 Other long term (current) drug therapy: Secondary | ICD-10-CM | POA: Diagnosis not present

## 2018-09-12 DIAGNOSIS — M150 Primary generalized (osteo)arthritis: Secondary | ICD-10-CM | POA: Diagnosis not present

## 2018-09-12 DIAGNOSIS — M059 Rheumatoid arthritis with rheumatoid factor, unspecified: Secondary | ICD-10-CM | POA: Diagnosis not present

## 2018-09-15 DIAGNOSIS — H04123 Dry eye syndrome of bilateral lacrimal glands: Secondary | ICD-10-CM | POA: Diagnosis not present

## 2018-09-15 LAB — HM DIABETES EYE EXAM

## 2018-10-11 ENCOUNTER — Other Ambulatory Visit: Payer: Self-pay | Admitting: Family Medicine

## 2018-10-11 NOTE — Telephone Encounter (Signed)
Courtesy refill before next appointment- patient will run short. Appointment 01/23/19 Requested Prescriptions  Pending Prescriptions Disp Refills  . metoprolol succinate (TOPROL-XL) 25 MG 24 hr tablet [Pharmacy Med Name: METOPROLOL SUCCINATE ER 25 MG TAB] 90 tablet 0    Sig: TAKE ONE TABLET (25 MG) BY MOUTH EVERY DAY     Cardiovascular:  Beta Blockers Passed - 10/11/2018 10:05 AM      Passed - Last BP in normal range    BP Readings from Last 1 Encounters:  07/25/18 128/73         Passed - Last Heart Rate in normal range    Pulse Readings from Last 1 Encounters:  07/25/18 (!) 58         Passed - Valid encounter within last 6 months    Recent Outpatient Visits          2 months ago [employer] annual wellness visit, subsequent   [facility], [place], DO   4 months ago Essential hypertension   [facility] [place], [place], DO      Future Appointments            In 3 months Johnson, Oralia Rud, DO [facility], [facility]

## 2018-12-25 ENCOUNTER — Other Ambulatory Visit: Payer: Self-pay | Admitting: Family Medicine

## 2018-12-25 MED ORDER — OSELTAMIVIR PHOSPHATE 75 MG PO CAPS: 75.0000 mg | ORAL_CAPSULE | Freq: Every day | ORAL | 0 refills | Status: DC

## 2019-01-09 ENCOUNTER — Other Ambulatory Visit: Payer: Self-pay | Admitting: Family Medicine

## 2019-01-09 MED ORDER — ESTRADIOL 1 MG PO TABS: 1.0000 mg | ORAL_TABLET | Freq: Every day | ORAL | 0 refills | Status: DC

## 2019-01-09 MED ORDER — LOSARTAN POTASSIUM 50 MG PO TABS: 50.0000 mg | ORAL_TABLET | Freq: Every day | ORAL | 0 refills | Status: DC

## 2019-01-09 NOTE — Telephone Encounter (Signed)
Copied from CRM [id number]. Topic: Quick Communication - Rx Refill/Question >> Jan 09, 2019 11:11 AM Arlyss Gandy, NT wrote: Medication: losartan (COZAAR) 50 MG tablet and estradiol (ESTRACE) 1 MG tablet   Has the patient contacted their pharmacy? Yes.   (Agent: If no, request that the patient contact the pharmacy for the refill.) (Agent: If yes, when and what did the pharmacy advise?)  Preferred Pharmacy (with phone number or street name): [facility] - [place], [place] - [address] [phone] (Phone) [phone] (Fax)    Agent: Please be advised that RX refills may take up to 3 business days. We ask that you follow-up with your pharmacy.

## 2019-01-09 NOTE — Telephone Encounter (Signed)
Requested medication (s) are due for refill today:   No for both medications losartan and estradiol  Requested medication (s) are on the active medication list:   No   Both are from historical providers.    Future visit scheduled:   Yes with Dr. Laural Benes in 2 wks   Last ordered: No information available for either medication   Requested Prescriptions  Pending Prescriptions Disp Refills   estradiol (ESTRACE) 1 MG tablet      Sig: Take 1 tablet (1 mg total) by mouth daily.     OB/GYN:  Estrogens Passed - 01/09/2019 11:17 AM      Passed - Mammogram is up-to-date per Health Maintenance      Passed - Last BP in normal range    BP Readings from Last 1 Encounters:  07/25/18 128/73         Passed - Valid encounter within last 12 months    Recent Outpatient Visits          5 months ago Medicare annual wellness visit, subsequent   [facility], [place], DO   7 months ago Essential hypertension   [facility] [place], [place], DO      Future Appointments            In 2 weeks Johnson, Megan P, DO [facility]          losartan (COZAAR) 50 MG tablet      Sig: Take 1 tablet (50 mg total) by mouth daily.     Cardiovascular:  Angiotensin Receptor Blockers Failed - 01/09/2019 11:17 AM      Failed - Cr in normal range and within 180 days    Creatinine, Ser  Date Value Ref Range Status  05/26/2018 0.81 0.44 - 1.00 mg/dL Final         Failed - K in normal range and within 180 days    Potassium  Date Value Ref Range Status  05/26/2018 4.2 3.5 - 5.1 mmol/L Final         Passed - Patient is not pregnant      Passed - Last BP in normal range    BP Readings from Last 1 Encounters:  07/25/18 128/73         Passed - Valid encounter within last 6 months    Recent Outpatient Visits          5 months ago Medicare annual wellness visit, subsequent   [facility], [place], DO   7 months ago Essential hypertension   [facility] [place], [place], DO      Future Appointments            In 2 weeks Laural Benes, Oralia Rud, DO [facility], [facility]

## 2019-01-15 ENCOUNTER — Ambulatory Visit: Payer: Medicare Other | Admitting: Nurse Practitioner

## 2019-01-15 ENCOUNTER — Encounter: Payer: Self-pay | Admitting: Family Medicine

## 2019-01-15 ENCOUNTER — Ambulatory Visit: Payer: Medicare Other | Admitting: Family Medicine

## 2019-01-15 VITALS — BP 127/71 | HR 72 | Temp 98.8°F | Ht 68.0 in | Wt 150.7 lb

## 2019-01-15 DIAGNOSIS — J111 Influenza due to unidentified influenza virus with other respiratory manifestations: Secondary | ICD-10-CM

## 2019-01-15 LAB — VERITOR FLU A/B WAIVED
INFLUENZA A: NEGATIVE
Influenza B: NEGATIVE

## 2019-01-15 NOTE — Progress Notes (Signed)
BP 127/71 (BP Location: Left Arm, Patient Position: Sitting, Cuff Size: Normal)   Pulse 72   Temp 98.8 F (37.1 C) (Oral)   Ht 5\' 8"  (1.727 m)   Wt 150 lb 11.2 oz (68.4 kg)   SpO2 98%   BMI 22.91 kg/m    Subjective:    Patient ID: Barbara Tucker, female    DOB: 1946-08-03, 73 y.o.   MRN: [id number]  HPI: Barbara Tucker is a 73 y.o. female  Chief Complaint  Patient presents with  . Cough    Began last week  . Fever    Fever started 2 days ago.   . Fatigue  . Generalized Body Aches  . Nasal Congestion   Cough, fever, body aches, fatigue, congestion x 2 days. Denies CP, SOB, HAs, N/V/D. Taking OTC cold and flu medication with minimal relief. Takes allegra daily for allergic rhinitis. Husband sick with the flu last week. No known hx of pulmonary dz, non smoker.   Relevant past medical, surgical, family and social history reviewed and updated as indicated. Interim medical history since our last visit reviewed. Allergies and medications reviewed and updated.  Review of Systems  Per HPI unless specifically indicated above     Objective:    BP 127/71 (BP Location: Left Arm, Patient Position: Sitting, Cuff Size: Normal)   Pulse 72   Temp 98.8 F (37.1 C) (Oral)   Ht 5\' 8"  (1.727 m)   Wt 150 lb 11.2 oz (68.4 kg)   SpO2 98%   BMI 22.91 kg/m   Wt Readings from Last 3 Encounters:  01/15/19 150 lb 11.2 oz (68.4 kg)  07/25/18 148 lb 1.6 oz (67.2 kg)  05/25/18 147 lb 9 oz (66.9 kg)    Physical Exam Vitals signs and nursing note reviewed.  Constitutional:      Appearance: Normal appearance.  HENT:     Head: Atraumatic.     Right Ear: Tympanic membrane and external ear normal.     Left Ear: Tympanic membrane and external ear normal.     Nose: Rhinorrhea present.     Mouth/Throat:     Mouth: Mucous membranes are moist.     Pharynx: Posterior oropharyngeal erythema present.  Eyes:     Extraocular Movements: Extraocular movements intact.   Conjunctiva/sclera: Conjunctivae normal.  Neck:     Musculoskeletal: Normal range of motion and neck supple.  Cardiovascular:     Rate and Rhythm: Normal rate and regular rhythm.     Heart sounds: Normal heart sounds.  Pulmonary:     Effort: Pulmonary effort is normal.     Breath sounds: Normal breath sounds. No wheezing.  Musculoskeletal: Normal range of motion.  Skin:    General: Skin is warm and dry.  Neurological:     Mental Status: She is alert and oriented to person, place, and time.  Psychiatric:        Mood and Affect: Mood normal.        Thought Content: Thought content normal.     Results for orders placed or performed in visit on 01/15/19  Veritor Flu A/B Waived  Result Value Ref Range   Influenza A Negative Negative   Influenza B Negative Negative      Assessment & Plan:   Problem List Items Addressed This Visit    None    Visit Diagnoses    Influenza    -  Primary   Rapid flu neg but sxs consistent. Tx with xofluza (  sample given), mucinex, supportive care. Return precautions reviewed   Relevant Orders   Veritor Flu A/B Waived (Completed)       Follow up plan: Return for as scheduled.

## 2019-01-23 ENCOUNTER — Ambulatory Visit: Payer: Medicare Other | Admitting: Family Medicine

## 2019-01-23 ENCOUNTER — Other Ambulatory Visit: Payer: Self-pay

## 2019-01-23 ENCOUNTER — Encounter: Payer: Self-pay | Admitting: Family Medicine

## 2019-01-23 VITALS — BP 128/68 | HR 96 | Temp 98.8°F

## 2019-01-23 DIAGNOSIS — E039 Hypothyroidism, unspecified: Secondary | ICD-10-CM

## 2019-01-23 DIAGNOSIS — E038 Other specified hypothyroidism: Secondary | ICD-10-CM

## 2019-01-23 DIAGNOSIS — M059 Rheumatoid arthritis with rheumatoid factor, unspecified: Secondary | ICD-10-CM

## 2019-01-23 DIAGNOSIS — I10 Essential (primary) hypertension: Secondary | ICD-10-CM

## 2019-01-23 MED ORDER — METOPROLOL SUCCINATE ER 25 MG PO TB24: ORAL_TABLET | ORAL | 1 refills | Status: DC

## 2019-01-23 MED ORDER — ESTRADIOL 1 MG PO TABS: 1.0000 mg | ORAL_TABLET | Freq: Every day | ORAL | 1 refills | Status: DC

## 2019-01-23 MED ORDER — LOSARTAN POTASSIUM 50 MG PO TABS: 50.0000 mg | ORAL_TABLET | Freq: Every day | ORAL | 1 refills | Status: DC

## 2019-01-23 NOTE — Progress Notes (Signed)
BP 128/68 (BP Location: Left Arm, Cuff Size: Normal)   Pulse 96   Temp 98.8 F (37.1 C) (Oral)   SpO2 99%    Subjective:    Patient ID: Barbara Tucker, female    DOB: 1946/06/01, 73 y.o.   MRN: [id number]  HPI: Barbara Tucker is a 73 y.o. female  Chief Complaint  Patient presents with  . Follow-up  . Hypertension   HYPERTENSION Hypertension status: stable  Satisfied with current treatment? yes Duration of hypertension: chronic BP monitoring frequency:  not checking BP medication side effects:  no Medication compliance: excellent compliance Previous BP meds: losartan, metoprolol Aspirin: no Recurrent headaches: no Visual changes: no Palpitations: no Dyspnea: no Chest pain: no Lower extremity edema: no Dizzy/lightheaded: no  HYPOTHYROIDISM Thyroid control status:stable Satisfied with current treatment? Not on anything Fatigue: no Cold intolerance: no Heat intolerance: no Weight gain: no Weight loss: no Constipation: no Diarrhea/loose stools: no Palpitations: no Lower extremity edema: no Anxiety/depressed mood: no   Relevant past medical, surgical, family and social history reviewed and updated as indicated. Interim medical history since our last visit reviewed. Allergies and medications reviewed and updated.  Review of Systems  Constitutional: Negative.   Respiratory: Negative.   Cardiovascular: Negative.   Musculoskeletal: Negative.   Neurological: Negative.   Psychiatric/Behavioral: Negative.     Per HPI unless specifically indicated above     Objective:    BP 128/68 (BP Location: Left Arm, Cuff Size: Normal)   Pulse 96   Temp 98.8 F (37.1 C) (Oral)   SpO2 99%   Wt Readings from Last 3 Encounters:  01/15/19 150 lb 11.2 oz (68.4 kg)  07/25/18 148 lb 1.6 oz (67.2 kg)  05/25/18 147 lb 9 oz (66.9 kg)    Physical Exam Vitals signs and nursing note reviewed.  Constitutional:      General: She is not in acute distress.  Appearance: Normal appearance. She is not ill-appearing, toxic-appearing or diaphoretic.  HENT:     Head: Normocephalic and atraumatic.     Right Ear: External ear normal.     Left Ear: External ear normal.     Nose: Nose normal.     Mouth/Throat:     Mouth: Mucous membranes are moist.     Pharynx: Oropharynx is clear.  Eyes:     General: No scleral icterus.       Right eye: No discharge.        Left eye: No discharge.     Extraocular Movements: Extraocular movements intact.     Conjunctiva/sclera: Conjunctivae normal.     Pupils: Pupils are equal, round, and reactive to light.  Neck:     Musculoskeletal: Normal range of motion and neck supple.  Cardiovascular:     Rate and Rhythm: Normal rate and regular rhythm.     Pulses: Normal pulses.     Heart sounds: Normal heart sounds. No murmur. No friction rub. No gallop.   Pulmonary:     Effort: Pulmonary effort is normal. No respiratory distress.     Breath sounds: Normal breath sounds. No stridor. No wheezing, rhonchi or rales.  Chest:     Chest wall: No tenderness.  Musculoskeletal: Normal range of motion.  Skin:    General: Skin is warm and dry.     Capillary Refill: Capillary refill takes less than 2 seconds.     Coloration: Skin is not jaundiced or pale.     Findings: No bruising, erythema, lesion or rash.  Neurological:     General: No focal deficit present.     Mental Status: She is alert and oriented to person, place, and time. Mental status is at baseline.  Psychiatric:        Mood and Affect: Mood normal.        Behavior: Behavior normal.        Thought Content: Thought content normal.        Judgment: Judgment normal.     Results for orders placed or performed in visit on 01/15/19  Veritor Flu A/B Waived  Result Value Ref Range   Influenza A Negative Negative   Influenza B Negative Negative      Assessment & Plan:   Problem List Items Addressed This Visit      Cardiovascular and Mediastinum    Hypertension - Primary    Under good control on recheck. Continue current regimen. Refills given today. Call with any concerns. Labs checked today.      Relevant Medications   losartan (COZAAR) 50 MG tablet   metoprolol succinate (TOPROL-XL) 25 MG 24 hr tablet   Other Relevant Orders   Basic metabolic panel     Endocrine   Subclinical hypothyroidism    Rechecking levels today. Await results. Call with any concerns.       Relevant Medications   metoprolol succinate (TOPROL-XL) 25 MG 24 hr tablet   Other Relevant Orders   TSH     Musculoskeletal and Integument   Rheumatoid arthritis (HCC)    Continue to follow with rheumatology. Call with any concerns.           Follow up plan: Return in about 6 months (around 07/26/2019) for Physical/wellness.

## 2019-01-23 NOTE — Assessment & Plan Note (Signed)
Continue to follow with rheumatology. Call with any concerns.  

## 2019-01-23 NOTE — Assessment & Plan Note (Signed)
Rechecking levels today. Await results. Call with any concerns.  

## 2019-01-23 NOTE — Assessment & Plan Note (Signed)
Under good control on recheck. Continue current regimen. Refills given today. Call with any concerns. Labs checked today.

## 2019-01-24 LAB — BASIC METABOLIC PANEL
BUN/Creatinine Ratio: 15 (ref 12–28)
BUN: 10 mg/dL (ref 8–27)
CALCIUM: 9.6 mg/dL (ref 8.7–10.3)
CO2: 24 mmol/L (ref 20–29)
CREATININE: 0.66 mg/dL (ref 0.57–1.00)
Chloride: 98 mmol/L (ref 96–106)
GFR calc Af Amer: 101 mL/min/{1.73_m2} (ref >59)
GFR calc non Af Amer: 88 mL/min/{1.73_m2} (ref >59)
GLUCOSE: 86 mg/dL (ref 65–99)
Potassium: 4.4 mmol/L (ref 3.5–5.2)
Sodium: 138 mmol/L (ref 134–144)

## 2019-01-24 LAB — TSH: TSH: 2.64 u[IU]/mL (ref 0.450–4.500)

## 2019-05-03 ENCOUNTER — Other Ambulatory Visit: Payer: Self-pay | Admitting: Family Medicine

## 2019-07-04 ENCOUNTER — Other Ambulatory Visit: Payer: Self-pay | Admitting: Family Medicine

## 2019-07-17 ENCOUNTER — Other Ambulatory Visit: Payer: Self-pay | Admitting: Family Medicine

## 2019-07-17 DIAGNOSIS — Z1231 Encounter for screening mammogram for malignant neoplasm of breast: Secondary | ICD-10-CM

## 2019-07-18 ENCOUNTER — Other Ambulatory Visit: Payer: Self-pay | Admitting: Family Medicine

## 2019-07-27 ENCOUNTER — Ambulatory Visit (INDEPENDENT_AMBULATORY_CARE_PROVIDER_SITE_OTHER): Payer: Medicare Other | Admitting: Family Medicine

## 2019-07-27 ENCOUNTER — Other Ambulatory Visit: Payer: Self-pay

## 2019-07-27 ENCOUNTER — Encounter: Payer: Self-pay | Admitting: Family Medicine

## 2019-07-27 VITALS — BP 145/76 | HR 58 | Temp 98.8°F | Ht 67.32 in | Wt 145.1 lb

## 2019-07-27 DIAGNOSIS — I10 Essential (primary) hypertension: Secondary | ICD-10-CM | POA: Diagnosis not present

## 2019-07-27 DIAGNOSIS — Z13220 Encounter for screening for lipoid disorders: Secondary | ICD-10-CM

## 2019-07-27 DIAGNOSIS — Z13820 Encounter for screening for osteoporosis: Secondary | ICD-10-CM

## 2019-07-27 DIAGNOSIS — Z23 Encounter for immunization: Secondary | ICD-10-CM | POA: Diagnosis not present

## 2019-07-27 DIAGNOSIS — Z0000 Encounter for general adult medical examination without abnormal findings: Secondary | ICD-10-CM

## 2019-07-27 DIAGNOSIS — E039 Hypothyroidism, unspecified: Secondary | ICD-10-CM | POA: Diagnosis not present

## 2019-07-27 DIAGNOSIS — M059 Rheumatoid arthritis with rheumatoid factor, unspecified: Secondary | ICD-10-CM

## 2019-07-27 DIAGNOSIS — E038 Other specified hypothyroidism: Secondary | ICD-10-CM

## 2019-07-27 MED ORDER — METOPROLOL SUCCINATE ER 25 MG PO TB24: ORAL_TABLET | ORAL | 1 refills | Status: DC

## 2019-07-27 MED ORDER — LOSARTAN POTASSIUM 50 MG PO TABS: 50.0000 mg | ORAL_TABLET | Freq: Every day | ORAL | 1 refills | Status: DC

## 2019-07-27 MED ORDER — ESTRADIOL 1 MG PO TABS: 1.0000 mg | ORAL_TABLET | Freq: Every day | ORAL | 1 refills | Status: DC

## 2019-07-27 NOTE — Progress Notes (Signed)
BP (!) 145/76   Pulse (!) 58   Temp 98.8 F (37.1 C)   Ht 5' 7.32" (1.71 m)   Wt 145 lb 2 oz (65.8 kg)   SpO2 98%   BMI 22.51 kg/m    Subjective:    Patient ID: Barbara Tucker, female    DOB: 01/22/1946, 73 y.o.   MRN: [id number]  HPI: Barbara Tucker is a 73 y.o. female presenting on 07/27/2019 for comprehensive medical examination. Current medical complaints include:  Sometimes has a difficult time getting a very deep breath. Happens about 3-4x a day, has been going on >1 year. Not sure if it's changing at all. Doesn't bother her when she's doing something.  HYPERTENSION Hypertension status: stable  Satisfied with current treatment? yes Duration of hypertension: chronic BP monitoring frequency:  not checking BP medication side effects:  no Medication compliance: excellent compliance Previous BP meds: metoprolol, losartan  Aspirin: yes Recurrent headaches: no Visual changes: no Palpitations: no Dyspnea: no Chest pain: no Lower extremity edema: no Dizzy/lightheaded: no  HYPOTHYROIDISM Thyroid control status:stable Satisfied with current treatment? Not on anything Fatigue: no Cold intolerance: no Heat intolerance: no Weight gain: no Weight loss: no Constipation: no Diarrhea/loose stools: yes Palpitations: yes- very occasionally Lower extremity edema: no Anxiety/depressed mood: no  She currently lives with: husband Menopausal Symptoms: yes  Functional Status Survey: Is the patient deaf or have difficulty hearing?: No Does the patient have difficulty seeing, even when wearing glasses/contacts?: No Does the patient have difficulty concentrating, remembering, or making decisions?: No Does the patient have difficulty walking or climbing stairs?: No Does the patient have difficulty dressing or bathing?: No Does the patient have difficulty doing errands alone such as visiting a doctor's office or shopping?: No  Fall Risk  07/27/2019 01/23/2019  07/25/2018 05/25/2018  Falls in the past year? 0 0 No No  Injury with Fall? 0 - - -  Follow up - Falls evaluation completed - -    Depression Screen Depression screen [facility] 2/9 07/27/2019 07/25/2018 05/25/2018  Decreased Interest 0 1 1  Down, Depressed, Hopeless 1 1 0  PHQ - 2 Score '1 2 1  ' Altered sleeping 0 0 -  Tired, decreased energy 1 1 -  Change in appetite 0 0 -  Feeling bad or failure about yourself  0 0 -  Trouble concentrating 1 0 -  Moving slowly or fidgety/restless 0 0 -  Suicidal thoughts 0 0 -  PHQ-9 Score 3 3 -  Difficult doing work/chores Not difficult at all Not difficult at all -   Advanced Directives Does patient have a HCPOA?    yes Does patient have a living will or MOST form?  yes  Past Medical History:  Past Medical History:  Diagnosis Date  . Allergic rhinitis   . Allergy   . Endometriosis   . Heart murmur 1975   minimal, seen on ultrasound long ago  . Hypertension   . Hypothyroidism   . IBS (irritable bowel syndrome)   . Migraine   . Osteoarthritis   . Rheumatoid arthritis [facility])     Surgical History:  Past Surgical History:  Procedure Laterality Date  . ABDOMINAL HYSTERECTOMY    . APPENDECTOMY    . BREAST EXCISIONAL BIOPSY Right 1975   excisional - negative  . BREAST SURGERY  1975?   benign cyst removed  . COLONOSCOPY WITH PROPOFOL N/A 02/03/2016   Procedure: COLONOSCOPY WITH PROPOFOL;  Surgeon: Lollie Sails, MD;  Location: [facility]  ENDOSCOPY;  Service: Endoscopy;  Laterality: N/A;  . OOPHORECTOMY Bilateral   . TONSILLECTOMY    . TUBAL LIGATION      Medications:  Current Outpatient Medications on File Prior to Visit  Medication Sig  . ascorbic acid (VITAMIN C) 500 MG tablet Take 500 mg by mouth daily.  . calcium carbonate (OSCAL) 1500 (600 Ca) MG TABS tablet Take 600 mg of elemental calcium by mouth 2 (two) times daily with a meal.  . Calcium Polycarbophil (FIBER-CAPS PO) Take by mouth.  . dicyclomine (BENTYL) 10 MG capsule Take 10 mg by  mouth 4 (four) times daily.  Marland Kitchen etodolac (LODINE) 400 MG tablet Take 400 mg by mouth 2 (two) times daily.  . fexofenadine (ALLEGRA) 180 MG tablet Take 180 mg by mouth daily.  . folic acid (FOLVITE) 1 MG tablet Take 1 mg by mouth daily.  Marland Kitchen guaiFENesin (MUCINEX) 600 MG 12 hr tablet Take by mouth 2 (two) times daily.  . hydroxychloroquine (PLAQUENIL) 200 MG tablet Take 200 mg by mouth 2 (two) times daily.  . methotrexate (RHEUMATREX) 2.5 MG tablet Take 15 mg by mouth once a week.   . Multiple Vitamin (MULTIVITAMIN) tablet Take 1 tablet by mouth daily.  . Omega-3 Fatty Acids (OMEGA-3 EPA FISH OIL PO) Take 340-1,000 mg by mouth daily.  Marland Kitchen omeprazole (PRILOSEC) 20 MG capsule Take 20 mg by mouth daily.   No current facility-administered medications on file prior to visit.     Allergies:  Allergies  Allergen Reactions  . Codeine   . Lisinopril Other (See Comments)    Migraine  . Sulfa Antibiotics     Social History:  Social History   Socioeconomic History  . Marital status: Married    Spouse name: Not on file  . Number of children: Not on file  . Years of education: Not on file  . Highest education level: Not on file  Occupational History  . Not on file  Social Needs  . Financial resource strain: Not on file  . Food insecurity    Worry: Not on file    Inability: Not on file  . Transportation needs    Medical: Not on file    Non-medical: Not on file  Tobacco Use  . Smoking status: Never Smoker  . Smokeless tobacco: Never Used  . Tobacco comment: never smoked, around lots of second hand  Substance and Sexual Activity  . Alcohol use: Not Currently    Comment: once a few glasses of wine a month, not since on RA drugs  . Drug use: No  . Sexual activity: Not Currently  Lifestyle  . Physical activity    Days per week: Not on file    Minutes per session: Not on file  . Stress: Not on file  Relationships  . Social Herbalist on phone: Not on file    Gets together:  Not on file    Attends religious service: Not on file    Active member of club or organization: Not on file    Attends meetings of clubs or organizations: Not on file    Relationship status: Not on file  . Intimate partner violence    Fear of current or ex partner: Not on file    Emotionally abused: Not on file    Physically abused: Not on file    Forced sexual activity: Not on file  Other Topics Concern  . Not on file  Social History Narrative  . Not  on file   Social History   Tobacco Use  Smoking Status Never Smoker  Smokeless Tobacco Never Used  Tobacco Comment   never smoked, around lots of second hand   Social History   Substance and Sexual Activity  Alcohol Use Not Currently   Comment: once a few glasses of wine a month, not since on RA drugs    Family History:  Family History  Problem Relation Age of Onset  . Breast cancer Paternal Grandmother   . Cancer Paternal Grandmother        Breast  . Heart disease Paternal Grandmother   . Stroke Mother   . Arthritis Mother        Osteo and rheumatoid  . Hypertension Mother   . Angina Mother   . Vision loss Mother   . Cancer Father        Multiple Myeloma  . Gout Father   . Cancer Sister        Breast  . Heart disease Sister   . Cancer Daughter        Prostate  . Cancer Sister        Skin  . Cancer Brother     Past medical history, surgical history, medications, allergies, family history and social history reviewed with patient today and changes made to appropriate areas of the chart.   Review of Systems  Constitutional: Positive for diaphoresis. Negative for chills, fever, malaise/fatigue and weight loss.  HENT: Negative.        Postnasal drip   Eyes: Negative.   Respiratory: Negative.   Cardiovascular: Negative.   Gastrointestinal: Positive for diarrhea and nausea. Negative for abdominal pain, blood in stool, constipation, heartburn, melena and vomiting.  Genitourinary: Negative.   Musculoskeletal:  Positive for joint pain. Negative for back pain, falls, myalgias and neck pain.  Skin: Negative.   Psychiatric/Behavioral: Negative.     All other ROS negative except what is listed above and in the HPI.      Objective:    BP (!) 145/76   Pulse (!) 58   Temp 98.8 F (37.1 C)   Ht 5' 7.32" (1.71 m)   Wt 145 lb 2 oz (65.8 kg)   SpO2 98%   BMI 22.51 kg/m   Wt Readings from Last 3 Encounters:  07/27/19 145 lb 2 oz (65.8 kg)  01/15/19 150 lb 11.2 oz (68.4 kg)  07/25/18 148 lb 1.6 oz (67.2 kg)    Physical Exam Vitals signs and nursing note reviewed.  Constitutional:      General: She is not in acute distress.    Appearance: Normal appearance. She is not ill-appearing, toxic-appearing or diaphoretic.  HENT:     Head: Normocephalic and atraumatic.     Right Ear: Tympanic membrane, ear canal and external ear normal. There is no impacted cerumen.     Left Ear: Tympanic membrane, ear canal and external ear normal. There is no impacted cerumen.     Nose: Nose normal. No congestion or rhinorrhea.     Mouth/Throat:     Mouth: Mucous membranes are moist.     Pharynx: Oropharynx is clear. No oropharyngeal exudate or posterior oropharyngeal erythema.  Eyes:     General: No scleral icterus.       Right eye: No discharge.        Left eye: No discharge.     Extraocular Movements: Extraocular movements intact.     Conjunctiva/sclera: Conjunctivae normal.     Pupils: Pupils are equal,  round, and reactive to light.  Neck:     Musculoskeletal: Normal range of motion and neck supple. No neck rigidity or muscular tenderness.     Vascular: No carotid bruit.  Cardiovascular:     Rate and Rhythm: Normal rate and regular rhythm.     Pulses: Normal pulses.     Heart sounds: No murmur. No friction rub. No gallop.   Pulmonary:     Effort: Pulmonary effort is normal. No respiratory distress.     Breath sounds: Normal breath sounds. No stridor. No wheezing, rhonchi or rales.  Chest:     Chest  wall: No tenderness.  Abdominal:     General: Abdomen is flat. Bowel sounds are normal. There is no distension.     Palpations: Abdomen is soft. There is no mass.     Tenderness: There is no abdominal tenderness. There is no right CVA tenderness, left CVA tenderness, guarding or rebound.     Hernia: No hernia is present.  Genitourinary:    Comments: Breast and pelvic exams deferred with shared decision making Musculoskeletal:        General: No swelling, tenderness, deformity or signs of injury.     Right lower leg: No edema.     Left lower leg: No edema.  Lymphadenopathy:     Cervical: No cervical adenopathy.  Skin:    General: Skin is warm and dry.     Capillary Refill: Capillary refill takes less than 2 seconds.     Coloration: Skin is not jaundiced or pale.     Findings: No bruising, erythema, lesion or rash.  Neurological:     General: No focal deficit present.     Mental Status: She is alert and oriented to person, place, and time. Mental status is at baseline.     Cranial Nerves: No cranial nerve deficit.     Sensory: No sensory deficit.     Motor: No weakness.     Coordination: Coordination normal.     Gait: Gait normal.     Deep Tendon Reflexes: Reflexes normal.  Psychiatric:        Mood and Affect: Mood normal.        Behavior: Behavior normal.        Thought Content: Thought content normal.        Judgment: Judgment normal.     6CIT Screen 07/27/2019 07/25/2018  What Year? 0 points 0 points  What month? 0 points 0 points  What time? 0 points 0 points  Count back from 20 0 points 0 points  Months in reverse 2 points 0 points  Repeat phrase 0 points 0 points  Total Score 2 0    Results for orders placed or performed in visit on 69/45/03  Basic metabolic panel  Result Value Ref Range   Glucose 86 65 - 99 mg/dL   BUN 10 8 - 27 mg/dL   Creatinine, Ser 0.66 0.57 - 1.00 mg/dL   GFR calc non Af Amer 88 >59 mL/min/1.73   GFR calc Af Amer 101 >59 mL/min/1.73    BUN/Creatinine Ratio 15 12 - 28   Sodium 138 134 - 144 mmol/L   Potassium 4.4 3.5 - 5.2 mmol/L   Chloride 98 96 - 106 mmol/L   CO2 24 20 - 29 mmol/L   Calcium 9.6 8.7 - 10.3 mg/dL  TSH  Result Value Ref Range   TSH 2.640 0.450 - 4.500 uIU/mL      Assessment & Plan:  Problem List Items Addressed This Visit      Cardiovascular and Mediastinum   Hypertension    Under fair control on current regimen. Continue current regimen. Continue to monitor. Call with any concerns. Refills given. Labs drawn today       Relevant Medications   losartan (COZAAR) 50 MG tablet   metoprolol succinate (TOPROL-XL) 25 MG 24 hr tablet   Other Relevant Orders   CBC with Differential/Platelet   Comprehensive metabolic panel   Microalbumin, Urine Waived   UA/M w/rflx Culture, Routine     Endocrine   Subclinical hypothyroidism    Rechecking levels today. Await results. Call with any concerns. Treat as needed.       Relevant Medications   metoprolol succinate (TOPROL-XL) 25 MG 24 hr tablet   Other Relevant Orders   CBC with Differential/Platelet   Comprehensive metabolic panel   TSH     Musculoskeletal and Integument   Rheumatoid arthritis ([place])    Continue to follow with rheumatology. Continue to monitor. Stable. Call with any concerns.       Relevant Medications   methotrexate (RHEUMATREX) 2.5 MG tablet   Other Relevant Orders   CBC with Differential/Platelet   Comprehensive metabolic panel    Other Visit Diagnoses    Medicare annual wellness visit, subsequent    -  Primary   Preventative care discussed today as below. Call with any concerns.    Routine general medical examination at a health care facility       Vaccines up to date. Screening labs checked today. Pap N/A. Mammogram/DEXA scheduled for October. Colonoscopy up to date. Continue diet and exercise.    Screening for cholesterol level       Labs drawn today. Await results.    Relevant Orders   Lipid Panel w/o Chol/HDL Ratio    Immunization due       Flu shot given today.   Relevant Orders   Flu Vaccine QUAD High Dose(Fluad) (Completed)   Screening for osteoporosis       DEXA ordered today.   Relevant Orders   DG Bone Density       Preventative Services:  Health Risk Assessment and Personalized Prevention Plan: Done today Bone Mass Measurements: Ordered today Breast Cancer Screening: Scheduled for 10/14 CVD Screening: Done today Cervical Cancer Screening: N/A Colon Cancer Screening: up to date Depression Screening: Done today Diabetes Screening: Done today Glaucoma Screening: See your eye doctor Hepatitis B vaccine: N/A Hepatitis C screening: up to date HIV Screening: up to date Flu Vaccine: Done today Lung cancer Screening: N/A Obesity Screening: Done today Pneumonia Vaccines (2): Up to date STI Screening: N/A  Follow up plan: Return in about 6 months (around 01/24/2020).   LABORATORY TESTING:  - Pap smear: not applicable  IMMUNIZATIONS:   - Tdap: Tetanus vaccination status reviewed: last tetanus booster within 10 years. - Influenza: Administered today - Pneumovax: Up to date - Prevnar: Up to date - Zostavax vaccine: Up to date  SCREENING: -Mammogram: Up to date  - Colonoscopy: Up to date  - Bone Density: Ordered today   PATIENT COUNSELING:   Advised to take 1 mg of folate supplement per day if capable of pregnancy.   Sexuality: Discussed sexually transmitted diseases, partner selection, use of condoms, avoidance of unintended pregnancy  and contraceptive alternatives.   Advised to avoid cigarette smoking.  I discussed with the patient that most people either abstain from alcohol or drink within safe limits (<=14/week and <=4 drinks/occasion for  males, <=7/weeks and <= 3 drinks/occasion for females) and that the risk for alcohol disorders and other health effects rises proportionally with the number of drinks per week and how often a drinker exceeds daily limits.  Discussed  cessation/primary prevention of drug use and availability of treatment for abuse.   Diet: Encouraged to adjust caloric intake to maintain  or achieve ideal body weight, to reduce intake of dietary saturated fat and total fat, to limit sodium intake by avoiding high sodium foods and not adding table salt, and to maintain adequate dietary potassium and calcium preferably from fresh fruits, vegetables, and low-fat dairy products.    stressed the importance of regular exercise  Injury prevention: Discussed safety belts, safety helmets, smoke detector, smoking near bedding or upholstery.   Dental health: Discussed importance of regular tooth brushing, flossing, and dental visits.    NEXT PREVENTATIVE PHYSICAL DUE IN 1 YEAR. Return in about 6 months (around 01/24/2020).

## 2019-07-27 NOTE — Assessment & Plan Note (Signed)
Continue to follow with rheumatology. Continue to monitor. Stable. Call with any concerns.

## 2019-07-27 NOTE — Assessment & Plan Note (Signed)
Under fair control on current regimen. Continue current regimen. Continue to monitor. Call with any concerns. Refills given. Labs drawn today. 

## 2019-07-27 NOTE — Patient Instructions (Addendum)
Preventative Services:  Health Risk Assessment and Personalized Prevention Plan: Done today Bone Mass Measurements: Ordered today Breast Cancer Screening: Scheduled for 10/14 CVD Screening: Done today Cervical Cancer Screening: N/A Colon Cancer Screening: up to date Depression Screening: Done today Diabetes Screening: Done today Glaucoma Screening: See your eye doctor Hepatitis B vaccine: N/A Hepatitis C screening: up to date HIV Screening: up to date Flu Vaccine: Done today Lung cancer Screening: N/A Obesity Screening: Done today Pneumonia Vaccines (2): Up to date STI Screening: N/A  DEXA appointmen 08/22/19 10:45 AM [place], do not take any calcium supplements the day of your exam   Health Maintenance After Age 50 After age 67, you are at a higher risk for certain long-term diseases and infections as well as injuries from falls. Falls are a major cause of broken bones and head injuries in people who are older than age 50. Getting regular preventive care can help to keep you healthy and well. Preventive care includes getting regular testing and making lifestyle changes as recommended by your health care provider. Talk with your health care provider about:  Which screenings and tests you should have. A screening is a test that checks for a disease when you have no symptoms.  A diet and exercise plan that is right for you. What should I know about screenings and tests to prevent falls? Screening and testing are the best ways to find a health problem early. Early diagnosis and treatment give you the best chance of managing medical conditions that are common after age 51. Certain conditions and lifestyle choices may make you more likely to have a fall. Your health care provider may recommend:  Regular vision checks. Poor vision and conditions such as cataracts can make you more likely to have a fall. If you wear glasses, make sure to get your prescription updated if your vision  changes.  Medicine review. Work with your health care provider to regularly review all of the medicines you are taking, including over-the-counter medicines. Ask your health care provider about any side effects that may make you more likely to have a fall. Tell your health care provider if any medicines that you take make you feel dizzy or sleepy.  Osteoporosis screening. Osteoporosis is a condition that causes the bones to get weaker. This can make the bones weak and cause them to break more easily.  Blood pressure screening. Blood pressure changes and medicines to control blood pressure can make you feel dizzy.  Strength and balance checks. Your health care provider may recommend certain tests to check your strength and balance while standing, walking, or changing positions.  Foot health exam. Foot pain and numbness, as well as not wearing proper footwear, can make you more likely to have a fall.  Depression screening. You may be more likely to have a fall if you have a fear of falling, feel emotionally low, or feel unable to do activities that you used to do.  Alcohol use screening. Using too much alcohol can affect your balance and may make you more likely to have a fall. What actions can I take to lower my risk of falls? General instructions  Talk with your health care provider about your risks for falling. Tell your health care provider if: ? You fall. Be sure to tell your health care provider about all falls, even ones that seem minor. ? You feel dizzy, sleepy, or off-balance.  Take over-the-counter and prescription medicines only as told by your health care provider. These  include any supplements.  Eat a healthy diet and maintain a healthy weight. A healthy diet includes low-fat dairy products, low-fat (lean) meats, and fiber from whole grains, beans, and lots of fruits and vegetables. Home safety  Remove any tripping hazards, such as rugs, cords, and clutter.  Install safety  equipment such as grab bars in bathrooms and safety rails on stairs.  Keep rooms and walkways well-lit. Activity   Follow a regular exercise program to stay fit. This will help you maintain your balance. Ask your health care provider what types of exercise are appropriate for you.  If you need a cane or walker, use it as recommended by your health care provider.  Wear supportive shoes that have nonskid soles. Lifestyle  Do not drink alcohol if your health care provider tells you not to drink.  If you drink alcohol, limit how much you have: ? 0-1 drink a day for women. ? 0-2 drinks a day for men.  Be aware of how much alcohol is in your drink. In the [place] one drink equals one typical bottle of beer (12 oz), one-half glass of wine (5 oz), or one shot of hard liquor (1 oz).  Do not use any products that contain nicotine or tobacco, such as cigarettes and e-cigarettes. If you need help quitting, ask your health care provider. Summary  Having a healthy lifestyle and getting preventive care can help to protect your health and wellness after age 55.  Screening and testing are the best way to find a health problem early and help you avoid having a fall. Early diagnosis and treatment give you the best chance for managing medical conditions that are more common for people who are older than age 4.  Falls are a major cause of broken bones and head injuries in people who are older than age 64. Take precautions to prevent a fall at home.  Work with your health care provider to learn what changes you can make to improve your health and wellness and to prevent falls. This information is not intended to replace advice given to you by your health care provider. Make sure you discuss any questions you have with your health care provider. Document Released: 09/07/2017 Document Revised: 02/15/2019 Document Reviewed: 09/07/2017 Elsevier Patient Education  2020 [employer].

## 2019-07-27 NOTE — Assessment & Plan Note (Signed)
Rechecking levels today. Await results. Call with any concerns. Treat as needed.  

## 2019-07-28 LAB — CBC WITH DIFFERENTIAL/PLATELET
Basophils Absolute: 0 10*3/uL (ref 0.0–0.2)
Basos: 1 %
EOS (ABSOLUTE): 0.2 10*3/uL (ref 0.0–0.4)
Eos: 3 %
Hematocrit: 39.2 % (ref 34.0–46.6)
Hemoglobin: 13.3 g/dL (ref 11.1–15.9)
Immature Grans (Abs): 0 10*3/uL (ref 0.0–0.1)
Immature Granulocytes: 0 %
Lymphocytes Absolute: 2.2 10*3/uL (ref 0.7–3.1)
Lymphs: 41 %
MCH: 31 pg (ref 26.6–33.0)
MCHC: 33.9 g/dL (ref 31.5–35.7)
MCV: 91 fL (ref 79–97)
Monocytes Absolute: 0.4 10*3/uL (ref 0.1–0.9)
Monocytes: 7 %
Neutrophils Absolute: 2.6 10*3/uL (ref 1.4–7.0)
Neutrophils: 48 %
Platelets: 272 10*3/uL (ref 150–450)
RBC: 4.29 x10E6/uL (ref 3.77–5.28)
RDW: 12.6 % (ref 11.7–15.4)
WBC: 5.5 10*3/uL (ref 3.4–10.8)

## 2019-07-28 LAB — COMPREHENSIVE METABOLIC PANEL
ALT: 10 IU/L (ref 0–32)
AST: 18 IU/L (ref 0–40)
Albumin/Globulin Ratio: 1.9 (ref 1.2–2.2)
Albumin: 4.7 g/dL (ref 3.7–4.7)
Alkaline Phosphatase: 53 IU/L (ref 39–117)
BUN/Creatinine Ratio: 13 (ref 12–28)
BUN: 11 mg/dL (ref 8–27)
Bilirubin Total: 0.4 mg/dL (ref 0.0–1.2)
CO2: 28 mmol/L (ref 20–29)
Calcium: 10.1 mg/dL (ref 8.7–10.3)
Chloride: 98 mmol/L (ref 96–106)
Creatinine, Ser: 0.82 mg/dL (ref 0.57–1.00)
GFR calc Af Amer: 82 mL/min/{1.73_m2} (ref >59)
GFR calc non Af Amer: 71 mL/min/{1.73_m2} (ref >59)
Globulin, Total: 2.5 g/dL (ref 1.5–4.5)
Glucose: 102 mg/dL — ABNORMAL HIGH (ref 65–99)
Potassium: 4.1 mmol/L (ref 3.5–5.2)
Sodium: 138 mmol/L (ref 134–144)
Total Protein: 7.2 g/dL (ref 6.0–8.5)

## 2019-07-28 LAB — TSH: TSH: 4.36 u[IU]/mL (ref 0.450–4.500)

## 2019-07-28 LAB — LIPID PANEL W/O CHOL/HDL RATIO
Cholesterol, Total: 159 mg/dL (ref 100–199)
HDL: 69 mg/dL (ref >39)
LDL Chol Calc (NIH): 73 mg/dL (ref 0–99)
Triglycerides: 95 mg/dL (ref 0–149)
VLDL Cholesterol Cal: 17 mg/dL (ref 5–40)

## 2019-07-29 LAB — UA/M W/RFLX CULTURE, ROUTINE
Bilirubin, UA: NEGATIVE
Glucose, UA: NEGATIVE
Ketones, UA: NEGATIVE
Nitrite, UA: NEGATIVE
Protein,UA: NEGATIVE
RBC, UA: NEGATIVE
Specific Gravity, UA: 1.015 (ref 1.005–1.030)
Urobilinogen, Ur: 0.2 mg/dL (ref 0.2–1.0)
pH, UA: 7.5 (ref 5.0–7.5)

## 2019-07-29 LAB — URINE CULTURE, REFLEX

## 2019-07-29 LAB — MICROSCOPIC EXAMINATION
Bacteria, UA: NONE SEEN
RBC: NONE SEEN /hpf (ref 0–2)

## 2019-07-29 LAB — MICROALBUMIN, URINE WAIVED
Creatinine, Urine Waived: 10 mg/dL (ref 10–300)
Microalb, Ur Waived: 10 mg/L (ref 0–19)
Microalb/Creat Ratio: <30 mg/g (ref <30)

## 2019-08-22 ENCOUNTER — Ambulatory Visit
Admission: RE | Admit: 2019-08-22 | Discharge: 2019-08-22 | Disposition: A | Discharge status: Home / Self Care | Payer: Medicare Other | Source: Ambulatory Visit | Attending: Family Medicine | Admitting: Family Medicine

## 2019-08-22 DIAGNOSIS — Z13820 Encounter for screening for osteoporosis: Secondary | ICD-10-CM

## 2019-08-22 DIAGNOSIS — Z1231 Encounter for screening mammogram for malignant neoplasm of breast: Secondary | ICD-10-CM | POA: Insufficient documentation

## 2019-08-22 DIAGNOSIS — M8589 Other specified disorders of bone density and structure, multiple sites: Secondary | ICD-10-CM | POA: Insufficient documentation

## 2020-01-04 ENCOUNTER — Ambulatory Visit: Payer: Medicare Other

## 2020-01-04 ENCOUNTER — Ambulatory Visit: Payer: Medicare Other | Attending: Internal Medicine

## 2020-01-04 DIAGNOSIS — Z23 Encounter for immunization: Secondary | ICD-10-CM | POA: Insufficient documentation

## 2020-01-04 NOTE — Progress Notes (Signed)
   Covid-19 Vaccination Clinic  Name:  Barbara Tucker    MRN: [id number] DOB: October 01, 1946  01/04/2020  Barbara Tucker was observed post Covid-19 immunization for 15 minutes without incidence. She was provided with Vaccine Information Sheet and instruction to access the V-Safe system.   Barbara Tucker was instructed to call 911 with any severe reactions post vaccine: Marland Kitchen Difficulty breathing  . Swelling of your face and throat  . A fast heartbeat  . A bad rash all over your body  . Dizziness and weakness    Immunizations Administered    Name Date Dose VIS Date Route   Pfizer COVID-19 Vaccine 01/04/2020  8:54 AM 0.3 mL 10/19/2019 Intramuscular   Manufacturer: [employer], [employer]   Lot: [id number]   NDC: [address]-1

## 2020-01-29 ENCOUNTER — Ambulatory Visit: Payer: Medicare Other | Attending: Internal Medicine

## 2020-01-29 DIAGNOSIS — Z23 Encounter for immunization: Secondary | ICD-10-CM

## 2020-01-29 NOTE — Progress Notes (Signed)
   Covid-19 Vaccination Clinic  Name:  Olar Santini    MRN: [id number] DOB: 1946/09/29  01/29/2020  Ms. Darden was observed post Covid-19 immunization for 15 minutes without incident. She was provided with Vaccine Information Sheet and instruction to access the V-Safe system.   Ms. Bissette was instructed to call 911 with any severe reactions post vaccine: Marland Kitchen Difficulty breathing  . Swelling of face and throat  . A fast heartbeat  . A bad rash all over body  . Dizziness and weakness   Immunizations Administered    Name Date Dose VIS Date Route   Pfizer COVID-19 Vaccine 01/29/2020  1:16 PM 0.3 mL 10/19/2019 Intramuscular   Manufacturer: [employer], [employer]   Lot: [id number]   NDC: [address]-4

## 2020-01-30 ENCOUNTER — Other Ambulatory Visit: Payer: Self-pay

## 2020-01-30 ENCOUNTER — Ambulatory Visit: Payer: Medicare Other | Admitting: Family Medicine

## 2020-01-31 ENCOUNTER — Other Ambulatory Visit: Payer: Self-pay

## 2020-01-31 ENCOUNTER — Encounter: Payer: Self-pay | Admitting: Family Medicine

## 2020-01-31 ENCOUNTER — Ambulatory Visit: Payer: Medicare Other | Admitting: Family Medicine

## 2020-01-31 VITALS — BP 137/77 | HR 65 | Temp 98.3°F

## 2020-01-31 DIAGNOSIS — M059 Rheumatoid arthritis with rheumatoid factor, unspecified: Secondary | ICD-10-CM | POA: Diagnosis not present

## 2020-01-31 DIAGNOSIS — E039 Hypothyroidism, unspecified: Secondary | ICD-10-CM

## 2020-01-31 DIAGNOSIS — I10 Essential (primary) hypertension: Secondary | ICD-10-CM | POA: Diagnosis not present

## 2020-01-31 DIAGNOSIS — M545 Low back pain, unspecified: Secondary | ICD-10-CM

## 2020-01-31 DIAGNOSIS — E038 Other specified hypothyroidism: Secondary | ICD-10-CM

## 2020-01-31 DIAGNOSIS — G8929 Other chronic pain: Secondary | ICD-10-CM

## 2020-01-31 MED ORDER — LOSARTAN POTASSIUM 50 MG PO TABS: 50.0000 mg | ORAL_TABLET | Freq: Every day | ORAL | 1 refills | Status: DC

## 2020-01-31 MED ORDER — ESTRADIOL 1 MG PO TABS: 1.0000 mg | ORAL_TABLET | Freq: Every day | ORAL | 1 refills | Status: DC

## 2020-01-31 MED ORDER — METOPROLOL SUCCINATE ER 25 MG PO TB24: ORAL_TABLET | ORAL | 1 refills | Status: DC

## 2020-01-31 NOTE — Assessment & Plan Note (Signed)
Under good control on current regimen. Continue current regimen. Continue to monitor. Call with any concerns. Refills given. Labs drawn today.   

## 2020-01-31 NOTE — Assessment & Plan Note (Signed)
Rechecking labs today. Await results. Call with any concerns.  

## 2020-01-31 NOTE — Assessment & Plan Note (Signed)
Continue to follow with rheumatology. Call with any concerns. Stable.

## 2020-01-31 NOTE — Patient Instructions (Addendum)
Low Back Sprain or Strain Rehab Ask your health care provider which exercises are safe for you. Do exercises exactly as told by your health care provider and adjust them as directed. It is normal to feel mild stretching, pulling, tightness, or discomfort as you do these exercises. Stop right away if you feel sudden pain or your pain gets worse. Do not begin these exercises until told by your health care provider. Stretching and range-of-motion exercises These exercises warm up your muscles and joints and improve the movement and flexibility of your back. These exercises also help to relieve pain, numbness, and tingling. Lumbar rotation  1. Lie on your back on a firm surface and bend your knees. 2. Straighten your arms out to your sides so each arm forms a 90-degree angle (right angle) with a side of your body. 3. Slowly move (rotate) both of your knees to one side of your body until you feel a stretch in your lower back (lumbar). Try not to let your shoulders lift off the floor. 4. Hold this position for __________ seconds. 5. Tense your abdominal muscles and slowly move your knees back to the starting position. 6. Repeat this exercise on the other side of your body. Repeat __________ times. Complete this exercise __________ times a day. Single knee to chest  1. Lie on your back on a firm surface with both legs straight. 2. Bend one of your knees. Use your hands to move your knee up toward your chest until you feel a gentle stretch in your lower back and buttock. ? Hold your leg in this position by holding on to the front of your knee. ? Keep your other leg as straight as possible. 3. Hold this position for __________ seconds. 4. Slowly return to the starting position. 5. Repeat with your other leg. Repeat __________ times. Complete this exercise __________ times a day. Prone extension on elbows  1. Lie on your abdomen on a firm surface (prone position). 2. Prop yourself up on your  elbows. 3. Use your arms to help lift your chest up until you feel a gentle stretch in your abdomen and your lower back. ? This will place some of your body weight on your elbows. If this is uncomfortable, try stacking pillows under your chest. ? Your hips should stay down, against the surface that you are lying on. Keep your hip and back muscles relaxed. 4. Hold this position for __________ seconds. 5. Slowly relax your upper body and return to the starting position. Repeat __________ times. Complete this exercise __________ times a day. Strengthening exercises These exercises build strength and endurance in your back. Endurance is the ability to use your muscles for a long time, even after they get tired. Pelvic tilt This exercise strengthens the muscles that lie deep in the abdomen. 1. Lie on your back on a firm surface. Bend your knees and keep your feet flat on the floor. 2. Tense your abdominal muscles. Tip your pelvis up toward the ceiling and flatten your lower back into the floor. ? To help with this exercise, you may place a small towel under your lower back and try to push your back into the towel. 3. Hold this position for __________ seconds. 4. Let your muscles relax completely before you repeat this exercise. Repeat __________ times. Complete this exercise __________ times a day. Alternating arm and leg raises  1. Get on your hands and knees on a firm surface. If you are on a hard floor, you   may want to use padding, such as an exercise mat, to cushion your knees. 2. Line up your arms and legs. Your hands should be directly below your shoulders, and your knees should be directly below your hips. 3. Lift your left leg behind you. At the same time, raise your right arm and straighten it in front of you. ? Do not lift your leg higher than your hip. ? Do not lift your arm higher than your shoulder. ? Keep your abdominal and back muscles tight. ? Keep your hips facing the  ground. ? Do not arch your back. ? Keep your balance carefully, and do not hold your breath. 4. Hold this position for __________ seconds. 5. Slowly return to the starting position. 6. Repeat with your right leg and your left arm. Repeat __________ times. Complete this exercise __________ times a day. Abdominal set with straight leg raise  1. Lie on your back on a firm surface. 2. Bend one of your knees and keep your other leg straight. 3. Tense your abdominal muscles and lift your straight leg up, 4-6 inches (10-15 cm) off the ground. 4. Keep your abdominal muscles tight and hold this position for __________ seconds. ? Do not hold your breath. ? Do not arch your back. Keep it flat against the ground. 5. Keep your abdominal muscles tense as you slowly lower your leg back to the starting position. 6. Repeat with your other leg. Repeat __________ times. Complete this exercise __________ times a day. Single leg lower with bent knees 1. Lie on your back on a firm surface. 2. Tense your abdominal muscles and lift your feet off the floor, one foot at a time, so your knees and hips are bent in 90-degree angles (right angles). ? Your knees should be over your hips and your lower legs should be parallel to the floor. 3. Keeping your abdominal muscles tense and your knee bent, slowly lower one of your legs so your toe touches the ground. 4. Lift your leg back up to return to the starting position. ? Do not hold your breath. ? Do not let your back arch. Keep your back flat against the ground. 5. Repeat with your other leg. Repeat __________ times. Complete this exercise __________ times a day. Posture and body mechanics Good posture and healthy body mechanics can help to relieve stress in your body's tissues and joints. Body mechanics refers to the movements and positions of your body while you do your daily activities. Posture is part of body mechanics. Good posture means:  Your spine is in its  natural S-curve position (neutral).  Your shoulders are pulled back slightly.  Your head is not tipped forward. Follow these guidelines to improve your posture and body mechanics in your everyday activities. Standing   When standing, keep your spine neutral and your feet about hip width apart. Keep a slight bend in your knees. Your ears, shoulders, and hips should line up.  When you do a task in which you stand in one place for a long time, place one foot up on a stable object that is 2-4 inches (5-10 cm) high, such as a footstool. This helps keep your spine neutral. Sitting   When sitting, keep your spine neutral and keep your feet flat on the floor. Use a footrest, if necessary, and keep your thighs parallel to the floor. Avoid rounding your shoulders, and avoid tilting your head forward.  When working at a desk or a computer, keep your desk   at a height where your hands are slightly lower than your elbows. Slide your chair under your desk so you are close enough to maintain good posture.  When working at a computer, place your monitor at a height where you are looking straight ahead and you do not have to tilt your head forward or downward to look at the screen. Resting  When lying down and resting, avoid positions that are most painful for you.  If you have pain with activities such as sitting, bending, stooping, or squatting, lie in a position in which your body does not bend very much. For example, avoid curling up on your side with your arms and knees near your chest (fetal position).  If you have pain with activities such as standing for a long time or reaching with your arms, lie with your spine in a neutral position and bend your knees slightly. Try the following positions: ? Lying on your side with a pillow between your knees. ? Lying on your back with a pillow under your knees. Lifting   When lifting objects, keep your feet at least shoulder width apart and tighten your  abdominal muscles.  Bend your knees and hips and keep your spine neutral. It is important to lift using the strength of your legs, not your back. Do not lock your knees straight out.  Always ask for help to lift heavy or awkward objects. This information is not intended to replace advice given to you by your health care provider. Make sure you discuss any questions you have with your health care provider. Document Revised: 02/16/2019 Document Reviewed: 11/16/2018 Elsevier Patient Education  2020 [employer]  Pectoralis Major Rehab Ask your health care provider which exercises are safe for you. Do exercises exactly as told by your health care provider and adjust them as directed. It is normal to feel mild stretching, pulling, tightness, or discomfort as you do these exercises. Stop right away if you feel sudden pain or your pain gets worse. Do not begin these exercises until told by your health care provider. Stretching and range-of-motion exercises These exercises warm up your muscles and joints and improve the movement and flexibility of your shoulder. These exercises can also help to relieve pain, numbness, and tingling. Pendulum This is a shoulder exercise in which you let the injured arm dangle toward the floor and then swing it like a clock pendulum. 7. Stand near a wall or a surface that you can hold onto for balance. 8. Bend at the waist and let your left / right arm hang straight down. Use your other arm to keep your balance. 9. Relax your arm and shoulder muscles, and move your hips and your trunk so your left / right arm swings freely. Your arm should swing because of the motion of your body, not because you are using your arm or shoulder muscles. 10. Keep moving your hips and trunk so your arm swings in the following directions, as told by your health care provider: ? Side to side. ? Forward and backward. ? In clockwise and counterclockwise circles. 11. Slowly return to the  starting position. Repeat __________ times. Complete this exercise __________ times a day. Standing shoulder abduction, passive In this exercise, the injured shoulder relaxes (passive) while you use the healthy arm to push it away from your body (abduction). 6. Stand and hold a broomstick, a cane, or a similar object. Place your hands a little more than shoulder width apart on the object. Your  left / right hand should be palm-up, and your other hand should be palm-down. 7. While keeping your elbow straight and your shoulder muscles relaxed, push the stick across your body toward your left / right side. Raise your left / right arm to the side of your body and then over your head until you feel a stretch in your shoulder. ? Stop when you reach the angle that is recommended by your health care provider. ? Avoid shrugging your shoulder while you raise your arm. Keep your shoulder blade tucked down toward the middle of your spine. 8. Hold for __________ seconds. 9. Slowly return to the starting position. Repeat __________ times. Complete this exercise __________ times a day. Supine wand shoulder flexion, passive In this exercise, the injured shoulder relaxes (passive) while you use the healthy arm to move it (flexion). 6. Lie on your back (supine position). You may bend your knees for comfort. 7. Hold a broomstick, a cane, or a similar object so that your hands are about shoulder width apart on the object. Your palms should face toward your feet. 8. Raise your left / right arm in front of your face, then behind your head (toward the floor). Use your other hand to help you do this. Stop when you feel a gentle stretch in your shoulder, or when you reach the angle that is recommended by your health care provider. 9. Hold for __________ seconds. 10. Use the stick and your other arm to help you return your left / right arm to the starting position. Repeat __________ times. Complete this exercise __________  times a day. Wand shoulder external rotation, passive In this exercise, the injured shoulder relaxes (passive) while you use the healthy arm to push it away to your side (external rotation). 5. Stand and hold a broomstick, a cane, or a similar object so your hands are about shoulder width apart on the object. 6. Start with your arms hanging down, then bend both elbows to a 90-degree angle (right angle). 7. Keep your left / right elbow at your side. Use your other hand to push the stick so your left / right forearm moves away from your body, out to your side. ? Keep your left / right elbow bent to 90 degrees and keep it against your side. ? Stop when you feel a gentle stretch in your shoulder, or when you reach the angle recommended by your health care provider. 8. Hold for __________ seconds. 9. Use the stick to help you return your left / right arm to the starting position. Repeat __________ times. Complete this exercise __________ times a day. Strengthening exercises These exercises build strength and endurance in your shoulder. Endurance is the ability to use your muscles for a long time, even after your muscles get tired. Scapular protraction, standing  7. Stand so you are facing a wall. Place your feet about one arm-length away from the wall. 8. Place your hands on the wall and straighten your elbows. 9. Keep your hands on the wall as you push your upper back away from the wall. You should feel your shoulder blades (scapulae) sliding forward (protraction) around your rib cage. Keep your elbows and your head still. ? If you are not sure that you are doing this exercise correctly, ask your health care provider for more instructions. 10. Hold for __________ seconds. 11. Slowly return to the starting position. Let your muscles relax completely before you repeat this exercise. Repeat __________ times. Complete this exercise __________ times  a day. Scapular retraction, seated This exercise is  also called shoulder blade squeezes. 7. Sit with good posture in a stable chair without armrests. Do not let your back touch the back of the chair. 8. Your arms should be at your sides with your elbows bent. You may rest your forearms on a pillow if that is more comfortable. 9. Squeeze your shoulder blades (scapulae) together. Bring them down and back (retraction). ? Keep your shoulders level. ? Do not lift your shoulders up toward your ears. 10. Hold for __________ seconds. 11. Return to the starting position. Repeat __________ times. Complete this exercise __________ times a day. This information is not intended to replace advice given to you by your health care provider. Make sure you discuss any questions you have with your health care provider. Document Revised: 02/15/2019 Document Reviewed: 10/23/2018 Elsevier Patient Education  [place].

## 2020-01-31 NOTE — Progress Notes (Signed)
BP 137/77 (BP Location: Left Arm, Patient Position: Sitting, Cuff Size: Normal)   Pulse 65   Temp 98.3 F (36.8 C) (Oral)   SpO2 97%    Subjective:    Patient ID: Barbara Tucker, female    DOB: 19-Sep-1946, 74 y.o.   MRN: [id number]  HPI: Barbara Tucker is a 74 y.o. female  Chief Complaint  Patient presents with  . Hypertension  . Hypothyroidism   She notes that she has been having some pains. When she's standing for a long time, she'll end up with pains in her low back. Feels more internal than muscular. She notes that she can stand for about 2-3 hours before it starts acting up. Goes away after sitting for a few minutes. Has been having some tightness in her L breast. Happens when she's leaning and sitting. No connection to exercise. No SOB  SUB-CLINICAL HYPOTHYROIDISM Thyroid control status:stable Satisfied with current treatment? yes Fatigue: yes Cold intolerance: yes Heat intolerance: yes Weight gain: no Weight loss: no Constipation: yes Diarrhea/loose stools: yes Palpitations: yes Lower extremity edema: no Anxiety/depressed mood: no  HYPERTENSION Hypertension status: controlled  Satisfied with current treatment? yes Duration of hypertension: chronic BP monitoring frequency:  not checking BP medication side effects:  no Medication compliance: excellent compliance Previous BP meds: metoprolol, losartan Aspirin: no Recurrent headaches: no Visual changes: no Palpitations: no Dyspnea: no Chest pain: no Lower extremity edema: no Dizzy/lightheaded: no  Relevant past medical, surgical, family and social history reviewed and updated as indicated. Interim medical history since our last visit reviewed. Allergies and medications reviewed and updated.  Review of Systems  Constitutional: Negative.   HENT: Negative.   Respiratory: Negative.   Cardiovascular: Negative.   Gastrointestinal: Positive for diarrhea. Negative for abdominal distention,  abdominal pain, anal bleeding, blood in stool, constipation, nausea, rectal pain and vomiting.  Musculoskeletal: Positive for back pain. Negative for arthralgias, gait problem, joint swelling, myalgias, neck pain and neck stiffness.  Skin: Negative.   Psychiatric/Behavioral: Negative.     Per HPI unless specifically indicated above     Objective:    BP 137/77 (BP Location: Left Arm, Patient Position: Sitting, Cuff Size: Normal)   Pulse 65   Temp 98.3 F (36.8 C) (Oral)   SpO2 97%   Wt Readings from Last 3 Encounters:  07/27/19 145 lb 2 oz (65.8 kg)  01/15/19 150 lb 11.2 oz (68.4 kg)  07/25/18 148 lb 1.6 oz (67.2 kg)    Physical Exam Vitals and nursing note reviewed.  Constitutional:      General: She is not in acute distress.    Appearance: Normal appearance. She is not ill-appearing, toxic-appearing or diaphoretic.  HENT:     Head: Normocephalic and atraumatic.     Right Ear: External ear normal.     Left Ear: External ear normal.     Nose: Nose normal.     Mouth/Throat:     Mouth: Mucous membranes are moist.     Pharynx: Oropharynx is clear.  Eyes:     General: No scleral icterus.       Right eye: No discharge.        Left eye: No discharge.     Extraocular Movements: Extraocular movements intact.     Conjunctiva/sclera: Conjunctivae normal.     Pupils: Pupils are equal, round, and reactive to light.  Cardiovascular:     Rate and Rhythm: Normal rate and regular rhythm.     Pulses: Normal pulses.  Heart sounds: Normal heart sounds. No murmur. No friction rub. No gallop.   Pulmonary:     Effort: Pulmonary effort is normal. No respiratory distress.     Breath sounds: Normal breath sounds. No stridor. No wheezing, rhonchi or rales.  Chest:     Chest wall: No tenderness.  Musculoskeletal:        General: Normal range of motion.     Cervical back: Normal range of motion and neck supple.  Skin:    General: Skin is warm and dry.     Capillary Refill: Capillary  refill takes less than 2 seconds.     Coloration: Skin is not jaundiced or pale.     Findings: No bruising, erythema, lesion or rash.  Neurological:     General: No focal deficit present.     Mental Status: She is alert and oriented to person, place, and time. Mental status is at baseline.  Psychiatric:        Mood and Affect: Mood normal.        Behavior: Behavior normal.        Thought Content: Thought content normal.        Judgment: Judgment normal.     Results for orders placed or performed in visit on 07/27/19  Microscopic Examination   URINE  Result Value Ref Range   WBC, UA 0-5 0 - 5 /hpf   RBC None seen 0 - 2 /hpf   Epithelial Cells (non renal) 0-10 0 - 10 /hpf   Bacteria, UA None seen None seen/Few  Urine Culture, Reflex   URINE  Result Value Ref Range   Urine Culture, Routine Final report    Organism ID, Bacteria Comment   CBC with Differential/Platelet  Result Value Ref Range   WBC 5.5 3.4 - 10.8 x10E3/uL   RBC 4.29 3.77 - 5.28 x10E6/uL   Hemoglobin 13.3 11.1 - 15.9 g/dL   Hematocrit 85.2 77.8 - 46.6 %   MCV 91 79 - 97 fL   MCH 31.0 26.6 - 33.0 pg   MCHC 33.9 31.5 - 35.7 g/dL   RDW 24.2 35.3 - 61.4 %   Platelets 272 150 - 450 x10E3/uL   Neutrophils 48 Not Estab. %   Lymphs 41 Not Estab. %   Monocytes 7 Not Estab. %   Eos 3 Not Estab. %   Basos 1 Not Estab. %   Neutrophils Absolute 2.6 1.4 - 7.0 x10E3/uL   Lymphocytes Absolute 2.2 0.7 - 3.1 x10E3/uL   Monocytes Absolute 0.4 0.1 - 0.9 x10E3/uL   EOS (ABSOLUTE) 0.2 0.0 - 0.4 x10E3/uL   Basophils Absolute 0.0 0.0 - 0.2 x10E3/uL   Immature Granulocytes 0 Not Estab. %   Immature Grans (Abs) 0.0 0.0 - 0.1 x10E3/uL  Comprehensive metabolic panel  Result Value Ref Range   Glucose 102 (H) 65 - 99 mg/dL   BUN 11 8 - 27 mg/dL   Creatinine, Ser 4.31 0.57 - 1.00 mg/dL   GFR calc non Af Amer 71 >59 mL/min/1.73   GFR calc Af Amer 82 >59 mL/min/1.73   BUN/Creatinine Ratio 13 12 - 28   Sodium 138 134 - 144 mmol/L     Potassium 4.1 3.5 - 5.2 mmol/L   Chloride 98 96 - 106 mmol/L   CO2 28 20 - 29 mmol/L   Calcium 10.1 8.7 - 10.3 mg/dL   Total Protein 7.2 6.0 - 8.5 g/dL   Albumin 4.7 3.7 - 4.7 g/dL   Globulin, Total 2.5 1.5 -  4.5 g/dL   Albumin/Globulin Ratio 1.9 1.2 - 2.2   Bilirubin Total 0.4 0.0 - 1.2 mg/dL   Alkaline Phosphatase 53 39 - 117 IU/L   AST 18 0 - 40 IU/L   ALT 10 0 - 32 IU/L  Lipid Panel w/o Chol/HDL Ratio  Result Value Ref Range   Cholesterol, Total 159 100 - 199 mg/dL   Triglycerides 95 0 - 149 mg/dL   HDL 69 >94 mg/dL   VLDL Cholesterol Cal 17 5 - 40 mg/dL   LDL Chol Calc (NIH) 73 0 - 99 mg/dL  Microalbumin, Urine Waived  Result Value Ref Range   Microalb, Ur Waived 10 0 - 19 mg/L   Creatinine, Urine Waived 10 10 - 300 mg/dL   Microalb/Creat Ratio <30 <30 mg/g  TSH  Result Value Ref Range   TSH 4.360 0.450 - 4.500 uIU/mL  UA/M w/rflx Culture, Routine   Specimen: Urine   URINE  Result Value Ref Range   Specific Gravity, UA 1.015 1.005 - 1.030   pH, UA 7.5 5.0 - 7.5   Color, UA Yellow Yellow   Appearance Ur Clear Clear   Leukocytes,UA Trace (A) Negative   Protein,UA Negative Negative/Trace   Glucose, UA Negative Negative   Ketones, UA Negative Negative   RBC, UA Negative Negative   Bilirubin, UA Negative Negative   Urobilinogen, Ur 0.2 0.2 - 1.0 mg/dL   Nitrite, UA Negative Negative   Microscopic Examination See below:    Urinalysis Reflex Comment       Assessment & Plan:   Problem List Items Addressed This Visit      Cardiovascular and Mediastinum   Hypertension - Primary    Under good control on current regimen. Continue current regimen. Continue to monitor. Call with any concerns. Refills given. Labs drawn today.       Relevant Medications   metoprolol succinate (TOPROL-XL) 25 MG 24 hr tablet   losartan (COZAAR) 50 MG tablet   Other Relevant Orders   Basic metabolic panel     Endocrine   Subclinical hypothyroidism    Rechecking labs today. Await  results. Call with any concerns.       Relevant Medications   metoprolol succinate (TOPROL-XL) 25 MG 24 hr tablet   Other Relevant Orders   TSH     Musculoskeletal and Integument   Rheumatoid arthritis (HCC)    Continue to follow with rheumatology. Call with any concerns. Stable.        Other Visit Diagnoses    Chronic left-sided low back pain without sciatica       Will try some exercises. Let us know if not getting better or getting worse.        Follow up plan: Return in about 6 months (around 08/02/2020) for physical.

## 2020-02-01 LAB — BASIC METABOLIC PANEL
BUN/Creatinine Ratio: 13 (ref 12–28)
BUN: 10 mg/dL (ref 8–27)
CO2: 25 mmol/L (ref 20–29)
Calcium: 9.4 mg/dL (ref 8.7–10.3)
Chloride: 98 mmol/L (ref 96–106)
Creatinine, Ser: 0.76 mg/dL (ref 0.57–1.00)
GFR calc Af Amer: 89 mL/min/{1.73_m2} (ref >59)
GFR calc non Af Amer: 78 mL/min/{1.73_m2} (ref >59)
Glucose: 97 mg/dL (ref 65–99)
Potassium: 4.2 mmol/L (ref 3.5–5.2)
Sodium: 135 mmol/L (ref 134–144)

## 2020-02-01 LAB — TSH: TSH: 3 u[IU]/mL (ref 0.450–4.500)

## 2020-03-08 IMAGING — MG DIGITAL SCREENING BILAT W/ TOMO W/ CAD
8 series · 9 of 24 positions shown · non-contrast
Comparison: Previous exam(s).

CLINICAL DATA: Screening.

EXAM:
DIGITAL SCREENING BILATERAL MAMMOGRAM WITH TOMO AND CAD

[L CC synth-2D]
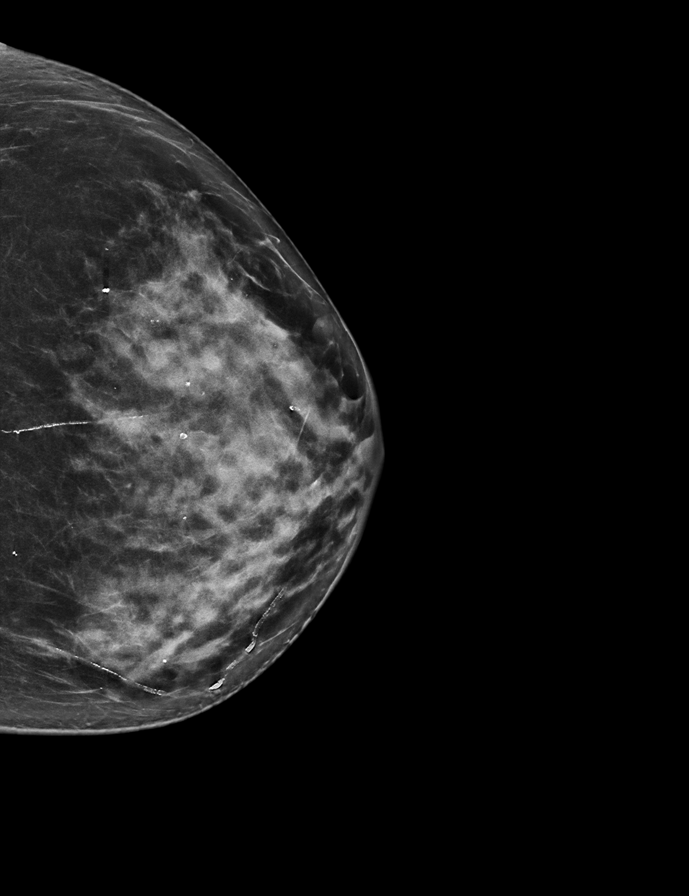

[L MLO synth-2D]
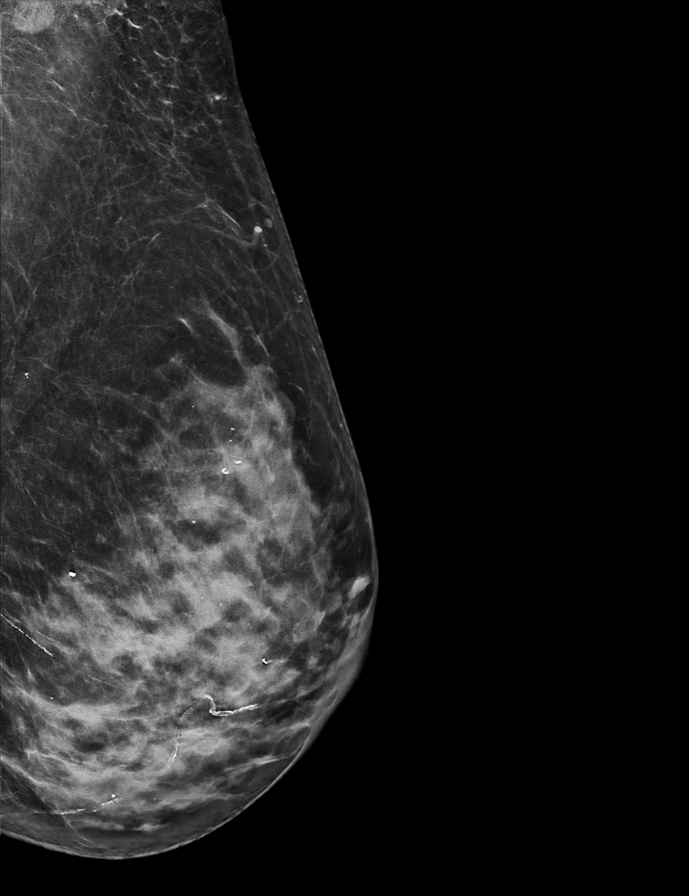

[R CC synth-2D]
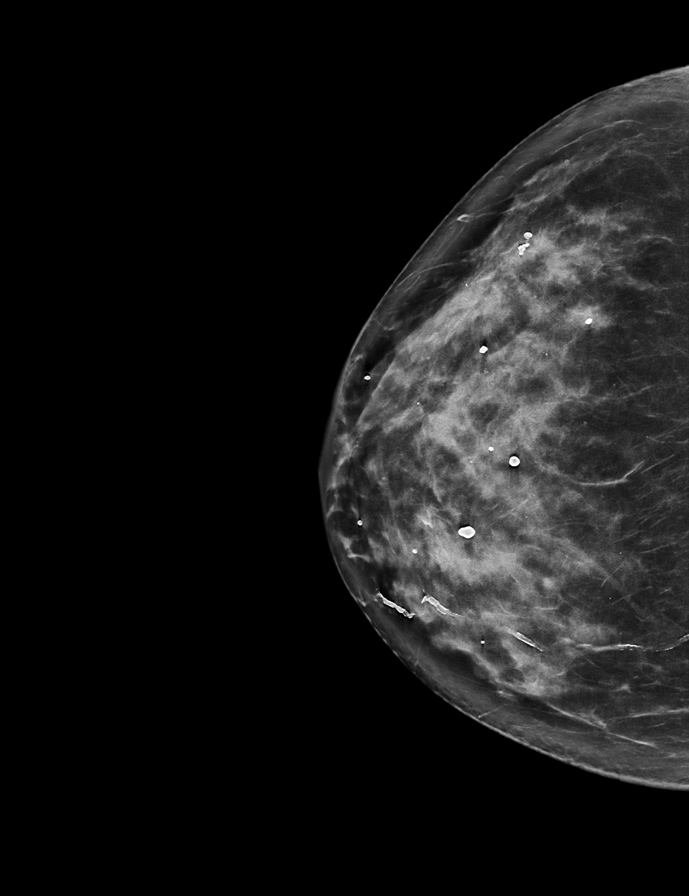

[R MLO synth-2D]
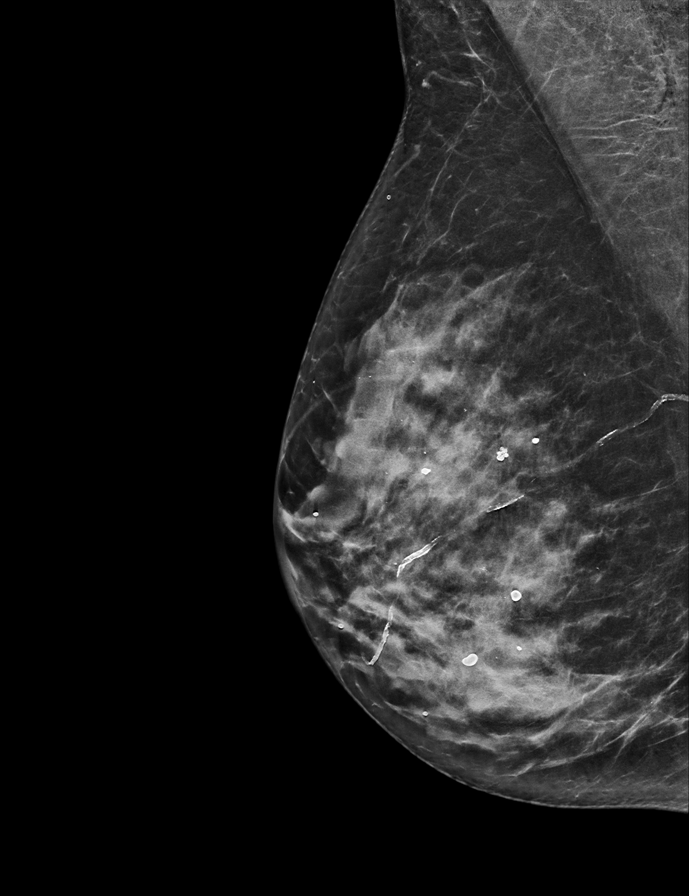

[R CC tomo · 2 of 69 frames shown]
[frame 23/69]
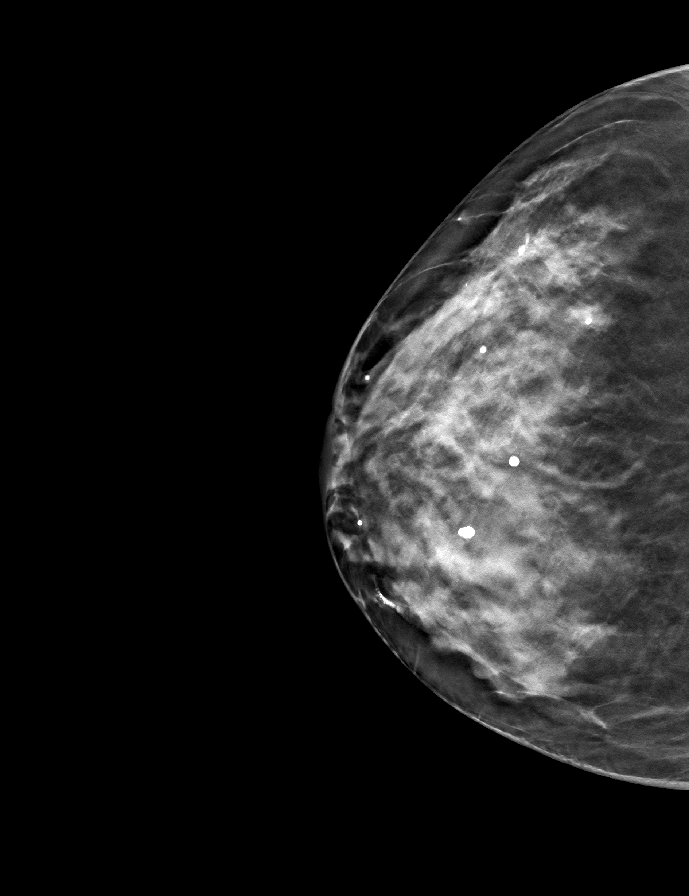
[frame 35/69]
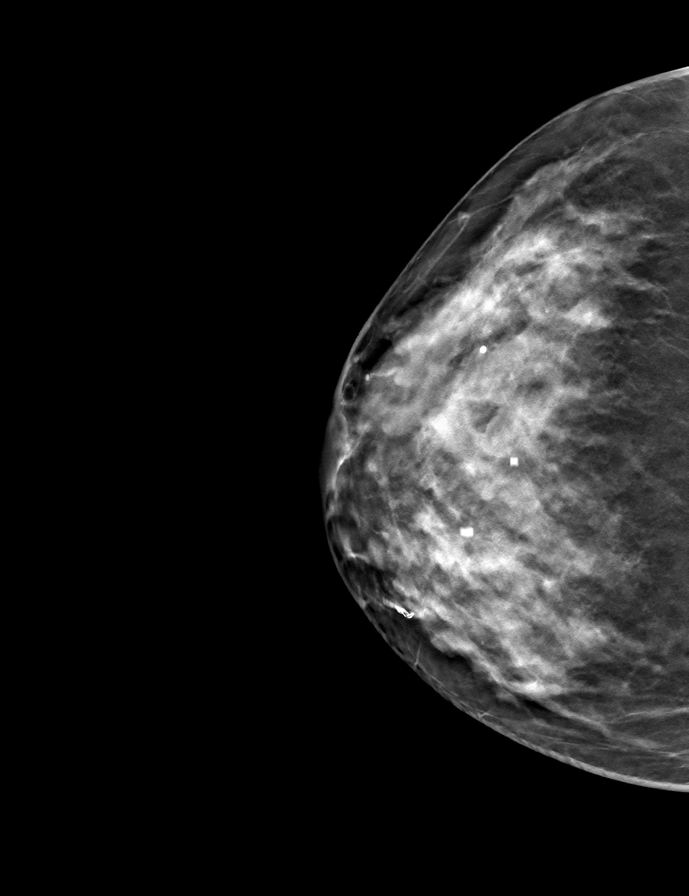

[L MLO tomo · tomo slice 34/67.0]
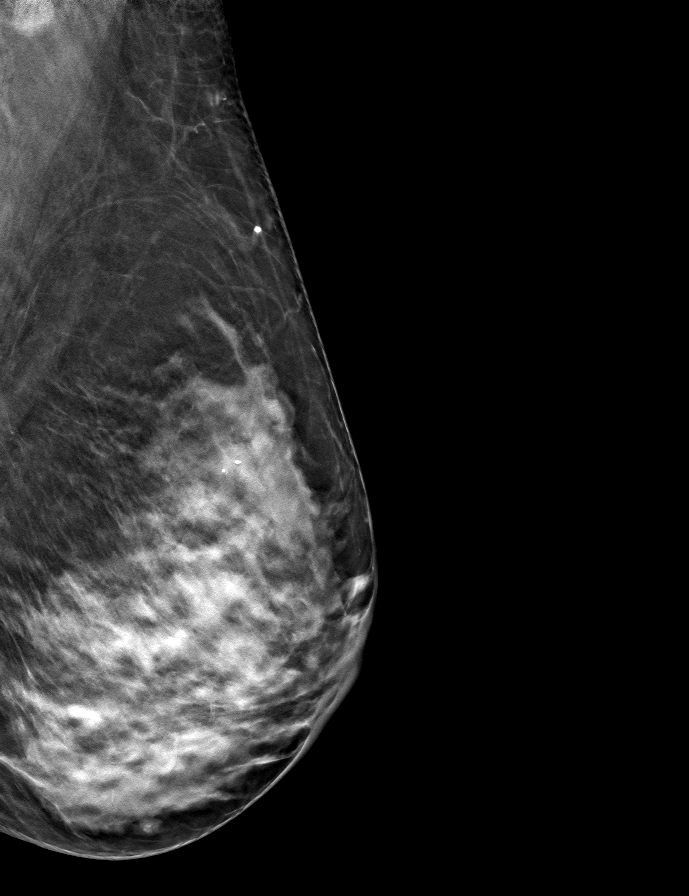

[R MLO tomo · tomo slice 33/65.0]
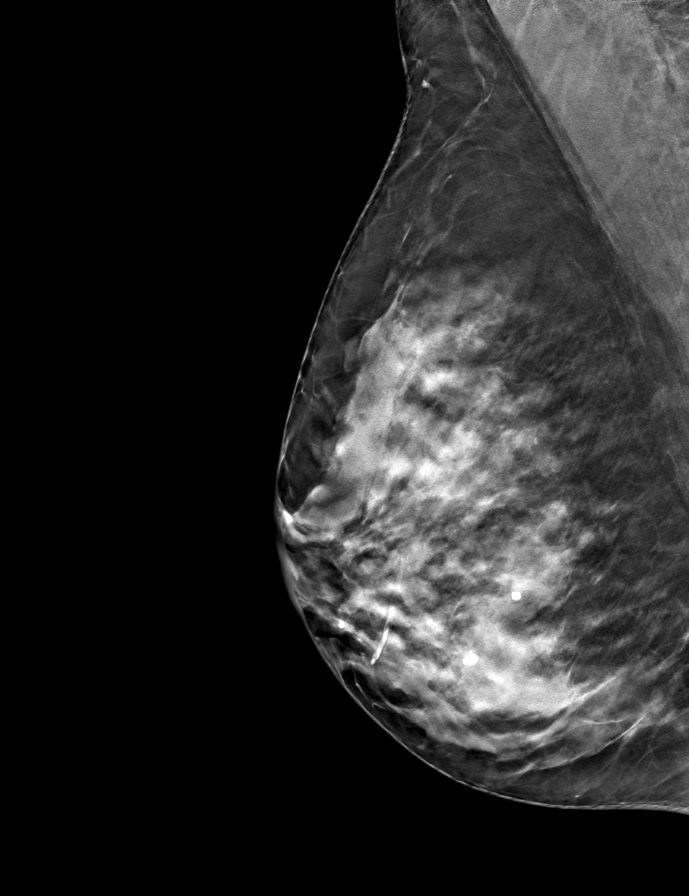

[L CC tomo · tomo slice 35/68.0]
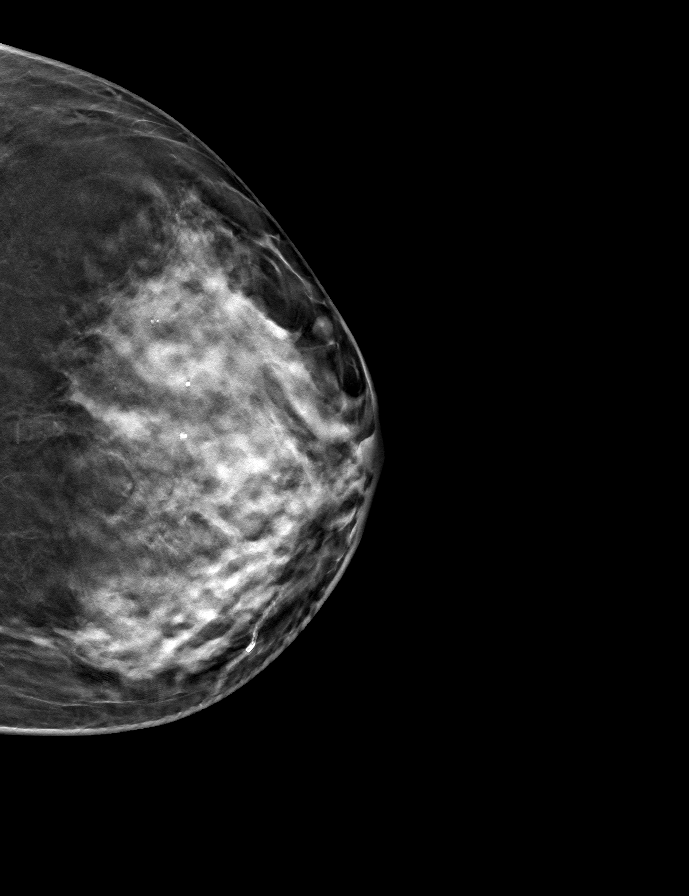

[9 of 24 positions shown; findings below may reference images not displayed]

ACR Breast Density Category c: The breast tissue is heterogeneously
dense, which may obscure small masses.
FINDINGS: There are no findings suspicious for malignancy. Images were
processed with CAD.
IMPRESSION: No mammographic evidence of malignancy. A result letter of this
screening mammogram will be mailed directly to the patient.

RECOMMENDATION:
Screening mammogram in one year. (Code:FT-U-LHB)

BI-RADS CATEGORY  1: Negative.

## 2020-03-18 ENCOUNTER — Other Ambulatory Visit: Payer: Self-pay

## 2020-03-18 ENCOUNTER — Ambulatory Visit: Payer: Medicare Other | Attending: Rheumatology | Admitting: Occupational Therapy

## 2020-03-18 ENCOUNTER — Encounter: Payer: Self-pay | Admitting: Occupational Therapy

## 2020-03-18 DIAGNOSIS — M25642 Stiffness of left hand, not elsewhere classified: Secondary | ICD-10-CM | POA: Diagnosis present

## 2020-03-18 DIAGNOSIS — M79642 Pain in left hand: Secondary | ICD-10-CM | POA: Diagnosis present

## 2020-03-18 NOTE — Patient Instructions (Signed)
Contrast in am and pm  tendon glides - gentle AROM with extention inbetween  Silicon sleeve on 2nd digit - night time and some during day if tolerating and not favoring   Joint protection principles - respect pain  Modify larger joint and avoid tight and sustained grips- built up handles

## 2020-03-18 NOTE — Therapy (Signed)
[facility] [address] [place], [address] Phone: [phone]   Fax:  [phone]  Occupational Therapy Evaluation  Patient Details  Name: Barbara Tucker MRN: [id number] Date of Birth: 04-29-1946 Referring Provider (OT): Dr Gavin Potters   Encounter Date: 03/18/2020  OT End of Session - 03/18/20 1103    Visit Number  1    Number of Visits  3    Date for OT Re-Evaluation  04/29/20    OT Start Time  1000    OT Stop Time  1041    OT Time Calculation (min)  41 min    Activity Tolerance  Patient tolerated treatment well    Behavior During Therapy  [facility] for tasks assessed/performed       Past Medical History:  Diagnosis Date  . Allergic rhinitis   . Allergy   . Endometriosis   . Heart murmur 1975   minimal, seen on ultrasound long ago  . Hypertension   . Hypothyroidism   . IBS (irritable bowel syndrome)   . Migraine   . Osteoarthritis   . Rheumatoid arthritis [facility])     Past Surgical History:  Procedure Laterality Date  . ABDOMINAL HYSTERECTOMY    . APPENDECTOMY    . BREAST EXCISIONAL BIOPSY Right 1975   excisional - negative  . BREAST SURGERY  1975?   benign cyst removed  . COLONOSCOPY WITH PROPOFOL N/A 02/03/2016   Procedure: COLONOSCOPY WITH PROPOFOL;  Surgeon: Christena Deem, MD;  Location: [facility] ENDOSCOPY;  Service: Endoscopy;  Laterality: N/A;  . OOPHORECTOMY Bilateral   . TONSILLECTOMY    . TUBAL LIGATION      There were no vitals filed for this visit.  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  Yes    Pain Score  3     Pain Location  Finger (Comment which one)    Pain Orientation  Left    Pain Descriptors / Indicators  Aching;Tightness    Pain Type  Chronic pain    Pain Onset  More than a month ago    Aggravating Factors   making fist        [facility] OT Assessment - 03/18/20 0001      Assessment   Medical Diagnosis  L hand/finger edema and pain     Referring Provider (OT)  Dr Gavin Potters    Onset Date/Surgical Date  11/09/19    Hand Dominance  Right    Next MD Visit  --   November - but has lab work prior to that    Prior Therapy  --   2017 for thumb pain     Home  Environment   Lives With  Spouse      Prior Function   Vocation  Retired    Leisure  On computer a lot for [employer],  on table and phone , some reading, quilt but not recently       Edema   Edema  2nd PIP R 6.2 and L 6.8      Strength   Right Hand Grip (lbs)  46    Right Hand Lateral Pinch  14 lbs    Right Hand 3 Point Pinch  14 lbs    Left Hand Grip (lbs)  44    Left Hand Lateral Pinch  14 lbs    Left Hand 3 Point Pinch  14 lbs      Right Hand AROM   R Index  MCP 0-90  85 Degrees    R Index PIP 0-100  100 Degrees    R Long  MCP 0-90  90 Degrees    R Long PIP 0-100  100 Degrees    R Ring  MCP 0-90  90 Degrees    R Ring PIP 0-100  100 Degrees    R Little  MCP 0-90  90 Degrees    R Little PIP 0-100  100 Degrees      Left Hand AROM   L Index  MCP 0-90  90 Degrees    L Index PIP 0-100  90 Degrees    L Long  MCP 0-90  90 Degrees    L Long PIP 0-100  100 Degrees    L Ring  MCP 0-90  90 Degrees    L Ring PIP 0-100  100 Degrees    L Little  MCP 0-90  90 Degrees    L Little PIP 0-100  100 Degrees               OT Treatments/Exercises (OP) - 03/18/20 0001      LUE Contrast Bath   Time  8 minutes    Comments  prior to review of HEP to decrease edema and pain       done and review HEP with pt - hand out provided:    Contrast in am and pm  tendon glides - gentle AROM with extention inbetween  Silicon sleeve on 2nd digit - night time and some during day if tolerating and not favoring   Joint protection principles - respect pain  Modify larger joint and avoid tight and sustained grips- built up handles          OT Education - 03/18/20 1102    Education provided  Yes    Education Details  findings of eval and HEP    Person(s) Educated  Patient    Methods  Explanation;Demonstration;Tactile cues;Verbal cues;Handout    Comprehension  Verbal cues required;Returned demonstration;Verbalized understanding       OT Short Term Goals - 09/21/16 1512      OT SHORT TERM GOAL #1   Title  Pt will be I HEP bilateral Hands/UE's for AROM    Status  Achieved      OT SHORT TERM GOAL #2   Title  Pt will be Mod I splint use, care and precautions bilateral hands     Baseline  pt not candidate for splints yet    Status  Revised        OT Long Term Goals - 03/18/20 1143      OT LONG TERM GOAL #1   Title  Pt to be verbalize use of joint protection and homeprogram modify activities , decease edema and increase motion - preventing future flare ups    Baseline  pain 3/10 - was  last week 5/10 ; increase edema at PIP 0.8 cm , and    Time  6    Period  Weeks    Status  New    Target Date  04/29/20            Plan - 03/18/20 1103    Clinical Impression Statement  Pt present at OT eval with diagnosis of OA/RA with increase edema, pain in L 2nd digit - had some pain in 3rd with end range this date - pt pain decrease since last week when she seen MD and took some Lodine - pain 3/10 with AROM -increase edema ,and decrease PIP flexion L 2nd digit,and arthritic changes to PIP's and DIP's of bilateral hands - pt can benefit from education of Joint protection and AE trng and HEP to prevent future flareups    OT Occupational Profile and History  Problem  Focused Assessment - Including review of records relating to presenting problem    Occupational performance deficits (Please refer to evaluation for details):  ADL's;IADL's;Play;Leisure;Social Participation    Body Structure / Function / Physical Skills  ADL;Flexibility;ROM;UE functional use;Decreased knowledge of use of DME;Edema;Pain;IADL    Rehab Potential  Good    Clinical Decision Making  Limited treatment options, no task modification necessary    Comorbidities Affecting Occupational Performance:  May have comorbidities impacting occupational performance   RA and OA   Modification or Assistance to Complete Evaluation   No modification of tasks or assist necessary to complete eval    OT Frequency  Biweekly    OT Duration  6 weeks    OT Treatment/Interventions  Self-care/ADL training;Patient/family education;Paraffin;Contrast Bath;DME and/or AE instruction;Manual Therapy;Ultrasound    Plan  assess progress with HEP    OT Home Exercise Plan  see pt instruction    Consulted and Agree with Plan of Care  Patient       Patient will benefit from skilled therapeutic intervention in order to improve the following deficits and impairments:   Body Structure / Function / Physical Skills: ADL, Flexibility, ROM, UE functional use, Decreased knowledge of use of DME, Edema, Pain, IADL       Visit Diagnosis: Pain in left hand - Plan: Ot plan of care cert/re-cert  Stiffness of left hand, not elsewhere classified - Plan: Ot plan of care cert/re-cert    Problem List Patient Active Problem List   Diagnosis Date Noted  . Osteopenia 05/25/2018  . Hypertension   . Subclinical hypothyroidism   . Rheumatoid arthritis ([place])     Pasqualino Witherspoon OTR/L,CLT 03/18/2020, 11:48 AM  [place] [facility] [address], [place], [address] Phone: [phone]   Fax:  [phone]  Name: Noel Henandez MRN: [id number] Date of Birth:  [birth year]

## 2020-04-02 ENCOUNTER — Ambulatory Visit: Payer: Medicare Other | Admitting: Occupational Therapy

## 2020-04-02 ENCOUNTER — Other Ambulatory Visit: Payer: Self-pay

## 2020-04-02 ENCOUNTER — Encounter: Payer: Self-pay | Admitting: Occupational Therapy

## 2020-04-02 DIAGNOSIS — M79642 Pain in left hand: Secondary | ICD-10-CM | POA: Diagnosis not present

## 2020-04-02 DIAGNOSIS — M25642 Stiffness of left hand, not elsewhere classified: Secondary | ICD-10-CM

## 2020-04-02 NOTE — Therapy (Signed)
[facility] [address], [place], [address] Phone: [phone]   Fax:  [phone]  Occupational Therapy Treatment  Patient Details  Name: Barbara Tucker MRN: [id number] Date of Birth: 28-Jun-1946 Referring Provider (OT): Dr Gavin Potters   Encounter Date: 04/02/2020  OT End of Session - 04/02/20 1942    Visit Number  2    Number of Visits  3    Date for OT Re-Evaluation  04/29/20    Authorization Type  [employer] Medicare - Needs G Code Every 10 th Visit    OT Start Time  1345    OT Stop Time  1429    OT Time Calculation (min)  44 min    Activity Tolerance  Patient tolerated treatment well    Behavior During Therapy  [facility] for tasks assessed/performed       Past Medical History:  Diagnosis Date  . Allergic rhinitis   . Allergy   . Endometriosis   . Heart murmur 1975   minimal, seen on ultrasound long ago  . Hypertension   . Hypothyroidism   . IBS (irritable bowel syndrome)   . Migraine   . Osteoarthritis   . Rheumatoid arthritis [facility])     Past Surgical History:  Procedure Laterality Date  . ABDOMINAL HYSTERECTOMY    . APPENDECTOMY    . BREAST EXCISIONAL BIOPSY Right 1975   excisional - negative  . BREAST SURGERY  1975?   benign cyst removed  . COLONOSCOPY WITH PROPOFOL N/A 02/03/2016   Procedure: COLONOSCOPY WITH PROPOFOL;  Surgeon: Christena Deem, MD;  Location: [facility] ENDOSCOPY;  Service: Endoscopy;  Laterality: N/A;  . OOPHORECTOMY Bilateral   . TONSILLECTOMY    . TUBAL LIGATION      There were no vitals filed for this visit.  Subjective Assessment - 04/02/20 1351    Subjective   Patient reports the swelling is better, 3/10 pain with some sharp pains at times for a short duration.  Pain more today than the last few days.  Was busy yesterday and felt pain when she was taking off a tight new sock.    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  Yes    Pain Score  3     Pain Location  Finger (Comment which one)    Pain Orientation  Left    Pain Descriptors / Indicators  Aching;Sharp;Tightness    Pain Type  Chronic pain    Pain Onset  More than a month ago    Pain Frequency  Intermittent    Multiple Pain Sites  No      Contrast to left hand for edema control prior to ROM exercises. Tendon gliding exercises for 10 reps with therapist demo and cues for technique.    Joint protection: Patient reports she has been trying to not do all thumbs with her smart phone, not too much lifting grasping with only tip pinch, supporting the milk jug as much as possible on the counter.  Uses a half gallon now.  Using larger pencils, pens, built up services.  More careful with needle and  using a needle puller.  Difficulty with remote control buttons, suggested using a stylus and gross grasping pattern.   Husband does most of the meal prep, she does cut onions at times but denies any difficulty. Uses fingerless gloves at times that help to keep hands warm.  Does not have a home paraffin unit but does dishes in warm water which helps her hands.  Discussed a variety of kitchen gadgets for joint protection.   Issued handouts regarding joint protection principles, general and specific to kitchen tasks.   Circumferential measurements of index finger right and left  Left  6.2 cm base of index 6.6 PIP 5.2 DIP  Right 6.2 cm  6.2 cm PIP 5.6 cm DIP  Silicone sleeve- did not work well, felt it was too tight at PIP and discontinued use  [facility] OT Assessment - 04/02/20 1946      Strength   Right Hand Grip (lbs)  55    Right Hand Lateral Pinch  14 lbs    Right Hand 3 Point Pinch  14 lbs    Left Hand  Grip (lbs)  45    Left Hand Lateral Pinch  14 lbs    Left Hand 3 Point Pinch  14 lbs     Reassessment of grip per flow sheet.                 OT Education - 04/02/20 1942    Education provided  Yes    Education Details  joint protection, ROM, use of contrast, heat    Person(s) Educated  Patient    Methods  Explanation;Demonstration;Tactile cues;Verbal cues;Handout    Comprehension  Verbal cues required;Returned demonstration;Verbalized understanding       OT Short Term Goals - 09/21/16 1512      OT SHORT TERM GOAL #1   Title  Pt will be I HEP bilateral Hands/UE's for AROM    Status  Achieved      OT SHORT TERM GOAL #2   Title  Pt will be Mod I splint use, care and precautions bilateral hands     Baseline  pt not candidate for splints yet    Status  Revised        OT Long Term Goals - 03/18/20 1143      OT LONG TERM GOAL #1   Title  Pt to be verbalize use of joint protection and homeprogram modify activities , decease edema and increase motion - preventing future flare ups    Baseline  pain 3/10 - was last week 5/10 ; increase edema at PIP 0.8 cm , and    Time  6    Period  Weeks    Status  New    Target Date  04/29/20            Plan - 04/02/20 1943    Clinical Impression Statement  Patient reports she is doing well, hand feeling better.  She has been performing home program with good success with use of contrast for edema control, ROM exercises and inplementation of joint protection principles.  Patient will continue with current program and will follow up as needed.    OT Occupational Profile and History  Problem Focused Assessment - Including review of records relating to presenting problem    Occupational performance deficits (Please refer to evaluation for details):  ADL's;IADL's;Play;Leisure;Social Participation    Body Structure / Function / Physical Skills  ADL;Flexibility;ROM;UE functional use;Decreased knowledge of use of DME;Edema;Pain;IADL  Rehab Potential  Good    Clinical Decision Making  Limited treatment options, no task modification necessary    Comorbidities Affecting Occupational Performance:  May have comorbidities impacting occupational performance   RA and OA   Modification or Assistance to Complete Evaluation   No modification of tasks or assist necessary to complete eval    OT Frequency  Biweekly    OT Duration  6 weeks    OT Treatment/Interventions  Self-care/ADL training;Patient/family education;Paraffin;Contrast Bath;DME and/or AE instruction;Manual Therapy;Ultrasound    Plan  assess progress with HEP    OT Home Exercise Plan  see pt instruction    Consulted and Agree with Plan of Care  Patient       Patient will benefit from skilled therapeutic intervention in order to improve the following deficits and impairments:   Body Structure / Function / Physical Skills: ADL, Flexibility, ROM, UE functional use, Decreased knowledge of use of DME, Edema, Pain, IADL       Visit Diagnosis: Stiffness of left hand, not elsewhere classified  Pain in left hand    Problem List Patient Active Problem List   Diagnosis Date Noted  . Osteopenia 05/25/2018  . Hypertension   . Subclinical hypothyroidism   . Rheumatoid arthritis ([facility])    Shayonna Ocampo T Kely Dohn, OTR/L, CLT  Lynann Demetrius 04/02/2020, 7:54 PM   [facility] [address], [place], [address] Phone: [phone]   Fax:  [phone]  Name: Barbara Tucker MRN: [id number] Date of Birth: 1946/08/07

## 2020-07-15 ENCOUNTER — Other Ambulatory Visit: Payer: Self-pay | Admitting: Family Medicine

## 2020-07-15 DIAGNOSIS — Z1231 Encounter for screening mammogram for malignant neoplasm of breast: Secondary | ICD-10-CM

## 2020-08-04 ENCOUNTER — Ambulatory Visit: Payer: Medicare Other

## 2020-08-05 ENCOUNTER — Encounter: Payer: Self-pay | Admitting: Family Medicine

## 2020-08-05 ENCOUNTER — Ambulatory Visit (INDEPENDENT_AMBULATORY_CARE_PROVIDER_SITE_OTHER): Payer: Medicare Other | Admitting: Family Medicine

## 2020-08-05 ENCOUNTER — Other Ambulatory Visit: Payer: Self-pay

## 2020-08-05 VITALS — BP 129/75 | HR 63 | Temp 98.8°F | Ht 66.7 in | Wt 144.0 lb

## 2020-08-05 DIAGNOSIS — I10 Essential (primary) hypertension: Secondary | ICD-10-CM | POA: Diagnosis not present

## 2020-08-05 DIAGNOSIS — R109 Unspecified abdominal pain: Secondary | ICD-10-CM

## 2020-08-05 DIAGNOSIS — E039 Hypothyroidism, unspecified: Secondary | ICD-10-CM | POA: Diagnosis not present

## 2020-08-05 DIAGNOSIS — Z23 Encounter for immunization: Secondary | ICD-10-CM | POA: Diagnosis not present

## 2020-08-05 DIAGNOSIS — Z13220 Encounter for screening for lipoid disorders: Secondary | ICD-10-CM

## 2020-08-05 DIAGNOSIS — Z0000 Encounter for general adult medical examination without abnormal findings: Secondary | ICD-10-CM

## 2020-08-05 DIAGNOSIS — E038 Other specified hypothyroidism: Secondary | ICD-10-CM

## 2020-08-05 LAB — MICROSCOPIC EXAMINATION: Bacteria, UA: NONE SEEN

## 2020-08-05 LAB — MICROALBUMIN, URINE WAIVED
Creatinine, Urine Waived: 10 mg/dL (ref 10–300)
Microalb, Ur Waived: 10 mg/L (ref 0–19)
Microalb/Creat Ratio: <30 mg/g (ref <30)

## 2020-08-05 LAB — URINALYSIS, ROUTINE W REFLEX MICROSCOPIC
Bilirubin, UA: NEGATIVE
Glucose, UA: NEGATIVE
Ketones, UA: NEGATIVE
Nitrite, UA: NEGATIVE
Protein,UA: NEGATIVE
Specific Gravity, UA: <1.005 — ABNORMAL LOW (ref 1.005–1.030)
Urobilinogen, Ur: 0.2 mg/dL (ref 0.2–1.0)
pH, UA: 6 (ref 5.0–7.5)

## 2020-08-05 MED ORDER — ESTRADIOL 1 MG PO TABS
1.0000 mg | ORAL_TABLET | Freq: Every day | ORAL | 1 refills | Status: DC
Start: 2020-08-05 — End: 2021-02-03

## 2020-08-05 MED ORDER — LOSARTAN POTASSIUM 50 MG PO TABS
50.0000 mg | ORAL_TABLET | Freq: Every day | ORAL | 1 refills | Status: DC
Start: 2020-08-05 — End: 2021-02-03

## 2020-08-05 MED ORDER — METOPROLOL SUCCINATE ER 25 MG PO TB24
ORAL_TABLET | ORAL | 1 refills | Status: DC
Start: 2020-08-05 — End: 2021-02-03

## 2020-08-05 NOTE — Assessment & Plan Note (Signed)
Under good control on current regimen. Continue current regimen. Continue to monitor. Call with any concerns. Refills given. Labs drawn today.   

## 2020-08-05 NOTE — Patient Instructions (Addendum)
Health Maintenance After Age 74 After age 74, you are at a higher risk for certain long-term diseases and infections as well as injuries from falls. Falls are a major cause of broken bones and head injuries in people who are older than age 74. Getting regular preventive care can help to keep you healthy and well. Preventive care includes getting regular testing and making lifestyle changes as recommended by your health care provider. Talk with your health care provider about:  Which screenings and tests you should have. A screening is a test that checks for a disease when you have no symptoms.  A diet and exercise plan that is right for you. What should I know about screenings and tests to prevent falls? Screening and testing are the best ways to find a health problem early. Early diagnosis and treatment give you the best chance of managing medical conditions that are common after age 74. Certain conditions and lifestyle choices may make you more likely to have a fall. Your health care provider may recommend:  Regular vision checks. Poor vision and conditions such as cataracts can make you more likely to have a fall. If you wear glasses, make sure to get your prescription updated if your vision changes.  Medicine review. Work with your health care provider to regularly review all of the medicines you are taking, including over-the-counter medicines. Ask your health care provider about any side effects that may make you more likely to have a fall. Tell your health care provider if any medicines that you take make you feel dizzy or sleepy.  Osteoporosis screening. Osteoporosis is a condition that causes the bones to get weaker. This can make the bones weak and cause them to break more easily.  Blood pressure screening. Blood pressure changes and medicines to control blood pressure can make you feel dizzy.  Strength and balance checks. Your health care provider may recommend certain tests to check your  strength and balance while standing, walking, or changing positions.  Foot health exam. Foot pain and numbness, as well as not wearing proper footwear, can make you more likely to have a fall.  Depression screening. You may be more likely to have a fall if you have a fear of falling, feel emotionally low, or feel unable to do activities that you used to do.  Alcohol use screening. Using too much alcohol can affect your balance and may make you more likely to have a fall. What actions can I take to lower my risk of falls? General instructions  Talk with your health care provider about your risks for falling. Tell your health care provider if: ? You fall. Be sure to tell your health care provider about all falls, even ones that seem minor. ? You feel dizzy, sleepy, or off-balance.  Take over-the-counter and prescription medicines only as told by your health care provider. These include any supplements.  Eat a healthy diet and maintain a healthy weight. A healthy diet includes low-fat dairy products, low-fat (lean) meats, and fiber from whole grains, beans, and lots of fruits and vegetables. Home safety  Remove any tripping hazards, such as rugs, cords, and clutter.  Install safety equipment such as grab bars in bathrooms and safety rails on stairs.  Keep rooms and walkways well-lit. Activity   Follow a regular exercise program to stay fit. This will help you maintain your balance. Ask your health care provider what types of exercise are appropriate for you.  If you need a cane or   walker, use it as recommended by your health care provider.  Wear supportive shoes that have nonskid soles. Lifestyle  Do not drink alcohol if your health care provider tells you not to drink.  If you drink alcohol, limit how much you have: ? 0-1 drink a day for women. ? 0-2 drinks a day for men.  Be aware of how much alcohol is in your drink. In the [place] one drink equals one typical bottle of beer (12  oz), one-half glass of wine (5 oz), or one shot of hard liquor (1 oz).  Do not use any products that contain nicotine or tobacco, such as cigarettes and e-cigarettes. If you need help quitting, ask your health care provider. Summary  Having a healthy lifestyle and getting preventive care can help to protect your health and wellness after age 39.  Screening and testing are the best way to find a health problem early and help you avoid having a fall. Early diagnosis and treatment give you the best chance for managing medical conditions that are more common for people who are older than age 57.  Falls are a major cause of broken bones and head injuries in people who are older than age 67. Take precautions to prevent a fall at home.  Work with your health care provider to learn what changes you can make to improve your health and wellness and to prevent falls. This information is not intended to replace advice given to you by your health care provider. Make sure you discuss any questions you have with your health care provider. Document Revised: 02/15/2019 Document Reviewed: 09/07/2017 Elsevier Patient Education  [place]. Influenza (Flu) Vaccine (Inactivated or Recombinant): What You Need to Know 1. Why get vaccinated? Influenza vaccine can prevent influenza (flu). Flu is a contagious disease that spreads around the [place] every year, usually between October and May. Anyone can get the flu, but it is more dangerous for some people. Infants and young children, people 70 years of age and older, pregnant women, and people with certain health conditions or a weakened immune system are at greatest risk of flu complications. Pneumonia, bronchitis, sinus infections and ear infections are examples of flu-related complications. If you have a medical condition, such as heart disease, cancer or diabetes, flu can make it worse. Flu can cause fever and chills, sore throat, muscle aches,  fatigue, cough, headache, and runny or stuffy nose. Some people may have vomiting and diarrhea, though this is more common in children than adults. Each year thousands of people in the [place] die from flu, and many more are hospitalized. Flu vaccine prevents millions of illnesses and flu-related visits to the doctor each year. 2. Influenza vaccine CDC recommends everyone 36 months of age and older get vaccinated every flu season. Children 6 months through 53 years of age may need 2 doses during a single flu season. Everyone else needs only 1 dose each flu season. It takes about 2 weeks for protection to develop after vaccination. There are many flu viruses, and they are always changing. Each year a new flu vaccine is made to protect against three or four viruses that are likely to cause disease in the upcoming flu season. Even when the vaccine doesn't exactly match these viruses, it may still provide some protection. Influenza vaccine does not cause flu. Influenza vaccine may be given at the same time as other vaccines. 3. Talk with your health care provider Tell your vaccine provider if the person  getting the vaccine:  Has had an allergic reaction after a previous dose of influenza vaccine, or has any severe, life-threatening allergies.  Has ever had Guillain-Barr Syndrome (also called GBS). In some cases, your health care provider may decide to postpone influenza vaccination to a future visit. People with minor illnesses, such as a cold, may be vaccinated. People who are moderately or severely ill should usually wait until they recover before getting influenza vaccine. Your health care provider can give you more information. 4. Risks of a vaccine reaction  Soreness, redness, and swelling where shot is given, fever, muscle aches, and headache can happen after influenza vaccine.  There may be a very small increased risk of Guillain-Barr Syndrome (GBS) after inactivated influenza vaccine  (the flu shot). Young children who get the flu shot along with pneumococcal vaccine (PCV13), and/or DTaP vaccine at the same time might be slightly more likely to have a seizure caused by fever. Tell your health care provider if a child who is getting flu vaccine has ever had a seizure. People sometimes faint after medical procedures, including vaccination. Tell your provider if you feel dizzy or have vision changes or ringing in the ears. As with any medicine, there is a very remote chance of a vaccine causing a severe allergic reaction, other serious injury, or death. 5. What if there is a serious problem? An allergic reaction could occur after the vaccinated person leaves the clinic. If you see signs of a severe allergic reaction (hives, swelling of the face and throat, difficulty breathing, a fast heartbeat, dizziness, or weakness), call 9-1-1 and get the person to the nearest hospital. For other signs that concern you, call your health care provider. Adverse reactions should be reported to the Vaccine Adverse Event Reporting System (VAERS). Your health care provider will usually file this report, or you can do it yourself. Visit the VAERS website at www.vaers.LAgents.no or call 929 417 6111.VAERS is only for reporting reactions, and VAERS staff do not give medical advice. 6. The National Vaccine Injury Compensation Program The [employer] Vaccine Injury Compensation Program (VICP) is a federal program that was created to compensate people who may have been injured by certain vaccines. Visit the VICP website at SpiritualWord.at or call 4106283556 to learn about the program and about filing a claim. There is a time limit to file a claim for compensation. 7. How can I learn more?  Ask your healthcare provider.  Call your local or state health department.  Contact the Centers for Disease Control and Prevention (CDC): ? Call (718)706-3411 (1-800-CDC-INFO) or ? Visit CDC's  [employer].cy Vaccine Information Statement (Interim) Inactivated Influenza Vaccine (06/22/2018) This information is not intended to replace advice given to you by your health care provider. Make sure you discuss any questions you have with your health care provider. Document Revised: 02/13/2019 Document Reviewed: 06/26/2018 Elsevier Patient Education  2020 [employer].  Low Back Sprain or Strain Rehab Ask your health care provider which exercises are safe for you. Do exercises exactly as told by your health care provider and adjust them as directed. It is normal to feel mild stretching, pulling, tightness, or discomfort as you do these exercises. Stop right away if you feel sudden pain or your pain gets worse. Do not begin these exercises until told by your health care provider. Stretching and range-of-motion exercises These exercises warm up your muscles and joints and improve the movement and flexibility of your back. These exercises also help to relieve pain, numbness, and tingling. Lumbar rotation  1. Lie on your back on a firm surface and bend your knees. 2. Straighten your arms out to your sides so each arm forms a 90-degree angle (right angle) with a side of your body. 3. Slowly move (rotate) both of your knees to one side of your body until you feel a stretch in your lower back (lumbar). Try not to let your shoulders lift off the floor. 4. Hold this position for __________ seconds. 5. Tense your abdominal muscles and slowly move your knees back to the starting position. 6. Repeat this exercise on the other side of your body. Repeat __________ times. Complete this exercise __________ times a day. Single knee to chest  1. Lie on your back on a firm surface with both legs straight. 2. Bend one of your knees. Use your hands to move your knee up toward your chest until you feel a gentle stretch in your lower back and buttock. ? Hold your leg in this position by holding on to the front  of your knee. ? Keep your other leg as straight as possible. 3. Hold this position for __________ seconds. 4. Slowly return to the starting position. 5. Repeat with your other leg. Repeat __________ times. Complete this exercise __________ times a day. Prone extension on elbows  1. Lie on your abdomen on a firm surface (prone position). 2. Prop yourself up on your elbows. 3. Use your arms to help lift your chest up until you feel a gentle stretch in your abdomen and your lower back. ? This will place some of your body weight on your elbows. If this is uncomfortable, try stacking pillows under your chest. ? Your hips should stay down, against the surface that you are lying on. Keep your hip and back muscles relaxed. 4. Hold this position for __________ seconds. 5. Slowly relax your upper body and return to the starting position. Repeat __________ times. Complete this exercise __________ times a day. Strengthening exercises These exercises build strength and endurance in your back. Endurance is the ability to use your muscles for a long time, even after they get tired. Pelvic tilt This exercise strengthens the muscles that lie deep in the abdomen. 1. Lie on your back on a firm surface. Bend your knees and keep your feet flat on the floor. 2. Tense your abdominal muscles. Tip your pelvis up toward the ceiling and flatten your lower back into the floor. ? To help with this exercise, you may place a small towel under your lower back and try to push your back into the towel. 3. Hold this position for __________ seconds. 4. Let your muscles relax completely before you repeat this exercise. Repeat __________ times. Complete this exercise __________ times a day. Alternating arm and leg raises  1. Get on your hands and knees on a firm surface. If you are on a hard floor, you may want to use padding, such as an exercise mat, to cushion your knees. 2. Line up your arms and legs. Your hands should be  directly below your shoulders, and your knees should be directly below your hips. 3. Lift your left leg behind you. At the same time, raise your right arm and straighten it in front of you. ? Do not lift your leg higher than your hip. ? Do not lift your arm higher than your shoulder. ? Keep your abdominal and back muscles tight. ? Keep your hips facing the ground. ? Do not arch your back. ? Keep your balance carefully,  and do not hold your breath. 4. Hold this position for __________ seconds. 5. Slowly return to the starting position. 6. Repeat with your right leg and your left arm. Repeat __________ times. Complete this exercise __________ times a day. Abdominal set with straight leg raise  1. Lie on your back on a firm surface. 2. Bend one of your knees and keep your other leg straight. 3. Tense your abdominal muscles and lift your straight leg up, 4-6 inches (10-15 cm) off the ground. 4. Keep your abdominal muscles tight and hold this position for __________ seconds. ? Do not hold your breath. ? Do not arch your back. Keep it flat against the ground. 5. Keep your abdominal muscles tense as you slowly lower your leg back to the starting position. 6. Repeat with your other leg. Repeat __________ times. Complete this exercise __________ times a day. Single leg lower with bent knees 1. Lie on your back on a firm surface. 2. Tense your abdominal muscles and lift your feet off the floor, one foot at a time, so your knees and hips are bent in 90-degree angles (right angles). ? Your knees should be over your hips and your lower legs should be parallel to the floor. 3. Keeping your abdominal muscles tense and your knee bent, slowly lower one of your legs so your toe touches the ground. 4. Lift your leg back up to return to the starting position. ? Do not hold your breath. ? Do not let your back arch. Keep your back flat against the ground. 5. Repeat with your other leg. Repeat __________  times. Complete this exercise __________ times a day. Posture and body mechanics Good posture and healthy body mechanics can help to relieve stress in your body's tissues and joints. Body mechanics refers to the movements and positions of your body while you do your daily activities. Posture is part of body mechanics. Good posture means:  Your spine is in its natural S-curve position (neutral).  Your shoulders are pulled back slightly.  Your head is not tipped forward. Follow these guidelines to improve your posture and body mechanics in your everyday activities. Standing   When standing, keep your spine neutral and your feet about hip width apart. Keep a slight bend in your knees. Your ears, shoulders, and hips should line up.  When you do a task in which you stand in one place for a long time, place one foot up on a stable object that is 2-4 inches (5-10 cm) high, such as a footstool. This helps keep your spine neutral. Sitting   When sitting, keep your spine neutral and keep your feet flat on the floor. Use a footrest, if necessary, and keep your thighs parallel to the floor. Avoid rounding your shoulders, and avoid tilting your head forward.  When working at a desk or a computer, keep your desk at a height where your hands are slightly lower than your elbows. Slide your chair under your desk so you are close enough to maintain good posture.  When working at a computer, place your monitor at a height where you are looking straight ahead and you do not have to tilt your head forward or downward to look at the screen. Resting  When lying down and resting, avoid positions that are most painful for you.  If you have pain with activities such as sitting, bending, stooping, or squatting, lie in a position in which your body does not bend very much. For example, avoid  curling up on your side with your arms and knees near your chest (fetal position).  If you have pain with activities such as  standing for a long time or reaching with your arms, lie with your spine in a neutral position and bend your knees slightly. Try the following positions: ? Lying on your side with a pillow between your knees. ? Lying on your back with a pillow under your knees. Lifting   When lifting objects, keep your feet at least shoulder width apart and tighten your abdominal muscles.  Bend your knees and hips and keep your spine neutral. It is important to lift using the strength of your legs, not your back. Do not lock your knees straight out.  Always ask for help to lift heavy or awkward objects. This information is not intended to replace advice given to you by your health care provider. Make sure you discuss any questions you have with your health care provider. Document Revised: 02/16/2019 Document Reviewed: 11/16/2018 Elsevier Patient Education  2020 [employer].

## 2020-08-05 NOTE — Progress Notes (Signed)
BP 129/75   Pulse 63   Temp 98.8 F (37.1 C) (Oral)   Ht 5' 6.7" (1.694 m)   Wt 144 lb (65.3 kg)   SpO2 96%   BMI 22.76 kg/m    Subjective:    Patient ID: Barbara Tucker, female    DOB: 21-Dec-1945, 74 y.o.   MRN: [id number]  HPI: Barbara Tucker is a 74 y.o. female presenting on 08/05/2020 for comprehensive medical examination. Current medical complaints include:  HYPERTENSION Hypertension status: controlled  Satisfied with current treatment? yes Duration of hypertension: chronic BP monitoring frequency:  not checking BP medication side effects:  no Medication compliance: excellent compliance Previous BP meds: losartan, metoprolol Aspirin: no Recurrent headaches: no Visual changes: no Palpitations: no Dyspnea: no Chest pain: myalgias in her arm and into her pec muscle Lower extremity edema: no Dizzy/lightheaded: no  She currently lives with: husband Menopausal Symptoms: yes- hot flashes and night sweats, seems to be when her hip hurts  Depression Screen done today and results listed below:  Depression screen [facility] 2/9 08/05/2020 07/27/2019 07/25/2018 05/25/2018  Decreased Interest 0 0 1 1  Down, Depressed, Hopeless 0 1 1 0  PHQ - 2 Score 0 '1 2 1  ' Altered sleeping - 0 0 -  Tired, decreased energy - 1 1 -  Change in appetite - 0 0 -  Feeling bad or failure about yourself  - 0 0 -  Trouble concentrating - 1 0 -  Moving slowly or fidgety/restless - 0 0 -  Suicidal thoughts - 0 0 -  PHQ-9 Score - 3 3 -  Difficult doing work/chores - Not difficult at all Not difficult at all -    Past Medical History:  Past Medical History:  Diagnosis Date  . Allergic rhinitis   . Allergy   . Endometriosis   . Heart murmur 1975   minimal, seen on ultrasound long ago  . Hypertension   . Hypothyroidism   . IBS (irritable bowel syndrome)   . Migraine   . Osteoarthritis   . Rheumatoid arthritis [facility])     Surgical History:  Past Surgical History:  Procedure  Laterality Date  . ABDOMINAL HYSTERECTOMY    . APPENDECTOMY    . BREAST EXCISIONAL BIOPSY Right 1975   excisional - negative  . BREAST SURGERY  1975?   benign cyst removed  . COLONOSCOPY WITH PROPOFOL N/A 02/03/2016   Procedure: COLONOSCOPY WITH PROPOFOL;  Surgeon: Lollie Sails, MD;  Location: [facility] ENDOSCOPY;  Service: Endoscopy;  Laterality: N/A;  . OOPHORECTOMY Bilateral   . TONSILLECTOMY    . TUBAL LIGATION      Medications:  Current Outpatient Medications on File Prior to Visit  Medication Sig  . ascorbic acid (VITAMIN C) 500 MG tablet Take 500 mg by mouth daily.  . calcium carbonate (OSCAL) 1500 (600 Ca) MG TABS tablet Take 600 mg of elemental calcium by mouth 2 (two) times daily with a meal.  . Calcium Polycarbophil (FIBER-CAPS PO) Take by mouth.  . dicyclomine (BENTYL) 10 MG capsule Take 10 mg by mouth 4 (four) times daily.  Marland Kitchen etodolac (LODINE) 400 MG tablet Take 400 mg by mouth 2 (two) times daily.  . fexofenadine (ALLEGRA) 180 MG tablet Take 180 mg by mouth daily.  . folic acid (FOLVITE) 1 MG tablet Take 1 mg by mouth daily.  Marland Kitchen guaiFENesin (MUCINEX) 600 MG 12 hr tablet Take by mouth 2 (two) times daily.  . hydroxychloroquine (PLAQUENIL) 200 MG tablet Take  200 mg by mouth daily.   . methotrexate (RHEUMATREX) 2.5 MG tablet Take 15 mg by mouth once a week.   . Multiple Vitamin (MULTIVITAMIN) tablet Take 1 tablet by mouth daily.  . Omega-3 Fatty Acids (OMEGA-3 EPA FISH OIL PO) Take 340-1,000 mg by mouth daily.  Marland Kitchen omeprazole (PRILOSEC) 20 MG capsule Take 20 mg by mouth daily.   No current facility-administered medications on file prior to visit.    Allergies:  Allergies  Allergen Reactions  . Codeine   . Lisinopril Other (See Comments)    Migraine  . Sulfa Antibiotics     Social History:  Social History   Socioeconomic History  . Marital status: Married    Spouse name: Not on file  . Number of children: Not on file  . Years of education: Not on file  .  Highest education level: Not on file  Occupational History  . Not on file  Tobacco Use  . Smoking status: Never Smoker  . Smokeless tobacco: Never Used  . Tobacco comment: never smoked, around lots of second hand  Vaping Use  . Vaping Use: Never used  Substance and Sexual Activity  . Alcohol use: Not Currently  . Drug use: No  . Sexual activity: Not Currently  Other Topics Concern  . Not on file  Social History Narrative  . Not on file   Social Determinants of Health   Financial Resource Strain:   . Difficulty of Paying Living Expenses: Not on file  Food Insecurity:   . Worried About Charity fundraiser in the Last Year: Not on file  . Ran Out of Food in the Last Year: Not on file  Transportation Needs:   . Lack of Transportation (Medical): Not on file  . Lack of Transportation (Non-Medical): Not on file  Physical Activity:   . Days of Exercise per Week: Not on file  . Minutes of Exercise per Session: Not on file  Stress:   . Feeling of Stress : Not on file  Social Connections:   . Frequency of Communication with Friends and Family: Not on file  . Frequency of Social Gatherings with Friends and Family: Not on file  . Attends Religious Services: Not on file  . Active Member of Clubs or Organizations: Not on file  . Attends Archivist Meetings: Not on file  . Marital Status: Not on file  Intimate Partner Violence:   . Fear of Current or Ex-Partner: Not on file  . Emotionally Abused: Not on file  . Physically Abused: Not on file  . Sexually Abused: Not on file   Social History   Tobacco Use  Smoking Status Never Smoker  Smokeless Tobacco Never Used  Tobacco Comment   never smoked, around lots of second hand   Social History   Substance and Sexual Activity  Alcohol Use Not Currently    Family History:  Family History  Problem Relation Age of Onset  . Breast cancer Paternal Grandmother   . Cancer Paternal Grandmother        Breast  . Heart  disease Paternal Grandmother   . Stroke Mother   . Arthritis Mother        Osteo and rheumatoid  . Hypertension Mother   . Angina Mother   . Vision loss Mother   . Cancer Father        Multiple Myeloma  . Gout Father   . Cancer Sister        Breast  .  Heart disease Sister   . Cancer Sister        Skin  . Cancer Brother        prostate    Past medical history, surgical history, medications, allergies, family history and social history reviewed with patient today and changes made to appropriate areas of the chart.   Review of Systems  Constitutional: Positive for diaphoresis. Negative for chills, fever, malaise/fatigue and weight loss.  HENT: Negative.   Eyes: Negative.   Respiratory: Negative.   Cardiovascular: Positive for palpitations (resolves with deep breathing). Negative for chest pain, orthopnea, claudication, leg swelling and PND.  Gastrointestinal: Positive for abdominal pain, diarrhea and nausea. Negative for blood in stool, constipation, heartburn, melena and vomiting.  Genitourinary: Positive for flank pain. Negative for dysuria, frequency, hematuria and urgency.  Musculoskeletal: Positive for myalgias. Negative for back pain, falls, joint pain and neck pain.  Skin: Negative.   Neurological: Negative.   Endo/Heme/Allergies: Positive for polydipsia. Negative for environmental allergies. Does not bruise/bleed easily.  Psychiatric/Behavioral: Negative.     All other ROS negative except what is listed above and in the HPI.      Objective:    BP 129/75   Pulse 63   Temp 98.8 F (37.1 C) (Oral)   Ht 5' 6.7" (1.694 m)   Wt 144 lb (65.3 kg)   SpO2 96%   BMI 22.76 kg/m   Wt Readings from Last 3 Encounters:  08/05/20 144 lb (65.3 kg)  07/27/19 145 lb 2 oz (65.8 kg)  01/15/19 150 lb 11.2 oz (68.4 kg)    Physical Exam Vitals and nursing note reviewed.  Constitutional:      General: She is not in acute distress.    Appearance: Normal appearance. She is not  ill-appearing, toxic-appearing or diaphoretic.  HENT:     Head: Normocephalic and atraumatic.     Right Ear: Tympanic membrane, ear canal and external ear normal. There is no impacted cerumen.     Left Ear: Tympanic membrane, ear canal and external ear normal. There is no impacted cerumen.     Nose: Nose normal. No congestion or rhinorrhea.     Mouth/Throat:     Mouth: Mucous membranes are moist.     Pharynx: Oropharynx is clear. No oropharyngeal exudate or posterior oropharyngeal erythema.  Eyes:     General: No scleral icterus.       Right eye: No discharge.        Left eye: No discharge.     Extraocular Movements: Extraocular movements intact.     Conjunctiva/sclera: Conjunctivae normal.     Pupils: Pupils are equal, round, and reactive to light.  Neck:     Vascular: No carotid bruit.  Cardiovascular:     Rate and Rhythm: Normal rate and regular rhythm.     Pulses: Normal pulses.     Heart sounds: No murmur heard.  No friction rub. No gallop.   Pulmonary:     Effort: Pulmonary effort is normal. No respiratory distress.     Breath sounds: Normal breath sounds. No stridor. No wheezing, rhonchi or rales.  Chest:     Chest wall: No tenderness.  Abdominal:     General: Abdomen is flat. Bowel sounds are normal. There is no distension.     Palpations: Abdomen is soft. There is no mass.     Tenderness: There is no abdominal tenderness. There is no right CVA tenderness, left CVA tenderness, guarding or rebound.     Hernia: No  hernia is present.  Genitourinary:    Comments: Breast and pelvic exams deferred with shared decision making Musculoskeletal:        General: No swelling, tenderness, deformity or signs of injury.     Cervical back: Normal range of motion and neck supple. No rigidity. No muscular tenderness.     Right lower leg: No edema.     Left lower leg: No edema.  Lymphadenopathy:     Cervical: No cervical adenopathy.  Skin:    General: Skin is warm and dry.      Capillary Refill: Capillary refill takes less than 2 seconds.     Coloration: Skin is not jaundiced or pale.     Findings: No bruising, erythema, lesion or rash.  Neurological:     General: No focal deficit present.     Mental Status: She is alert and oriented to person, place, and time. Mental status is at baseline.     Cranial Nerves: No cranial nerve deficit.     Sensory: No sensory deficit.     Motor: No weakness.     Coordination: Coordination normal.     Gait: Gait normal.     Deep Tendon Reflexes: Reflexes normal.  Psychiatric:        Mood and Affect: Mood normal.        Behavior: Behavior normal.        Thought Content: Thought content normal.        Judgment: Judgment normal.     Results for orders placed or performed in visit on 08/05/20  Microscopic Examination   Urine  Result Value Ref Range   WBC, UA 0-5 0 - 5 /hpf   RBC 0-2 0 - 2 /hpf   Epithelial Cells (non renal) 0-10 0 - 10 /hpf   Bacteria, UA None seen None seen/Few  CBC with Differential/Platelet  Result Value Ref Range   WBC 10.1 3.4 - 10.8 x10E3/uL   RBC 4.26 3.77 - 5.28 x10E6/uL   Hemoglobin 13.2 11.1 - 15.9 g/dL   Hematocrit 39.3 34.0 - 46.6 %   MCV 92 79 - 97 fL   MCH 31.0 26.6 - 33.0 pg   MCHC 33.6 31 - 35 g/dL   RDW 12.6 11.7 - 15.4 %   Platelets 271 150 - 450 x10E3/uL   Neutrophils 72 Not Estab. %   Lymphs 20 Not Estab. %   Monocytes 5 Not Estab. %   Eos 2 Not Estab. %   Basos 1 Not Estab. %   Neutrophils Absolute 7.2 (H) 1 - 7 x10E3/uL   Lymphocytes Absolute 2.1 0 - 3 x10E3/uL   Monocytes Absolute 0.5 0 - 0 x10E3/uL   EOS (ABSOLUTE) 0.2 0.0 - 0.4 x10E3/uL   Basophils Absolute 0.1 0 - 0 x10E3/uL   Immature Granulocytes 0 Not Estab. %   Immature Grans (Abs) 0.0 0.0 - 0.1 x10E3/uL  Comprehensive metabolic panel  Result Value Ref Range   Glucose 101 (H) 65 - 99 mg/dL   BUN 10 8 - 27 mg/dL   Creatinine, Ser 0.74 0.57 - 1.00 mg/dL   GFR calc non Af Amer 80 >59 mL/min/1.73   GFR calc Af  Amer 92 >59 mL/min/1.73   BUN/Creatinine Ratio 14 12 - 28   Sodium 138 134 - 144 mmol/L   Potassium 4.2 3.5 - 5.2 mmol/L   Chloride 100 96 - 106 mmol/L   CO2 24 20 - 29 mmol/L   Calcium 9.6 8.7 - 10.3 mg/dL  Total Protein 7.5 6.0 - 8.5 g/dL   Albumin 4.7 3.7 - 4.7 g/dL   Globulin, Total 2.8 1.5 - 4.5 g/dL   Albumin/Globulin Ratio 1.7 1.2 - 2.2   Bilirubin Total 0.4 0.0 - 1.2 mg/dL   Alkaline Phosphatase 61 44 - 121 IU/L   AST 23 0 - 40 IU/L   ALT 11 0 - 32 IU/L  Lipid Panel w/o Chol/HDL Ratio  Result Value Ref Range   Cholesterol, Total 167 100 - 199 mg/dL   Triglycerides 136 0 - 149 mg/dL   HDL 77 >39 mg/dL   VLDL Cholesterol Cal 23 5 - 40 mg/dL   LDL Chol Calc (NIH) 67 0 - 99 mg/dL  TSH  Result Value Ref Range   TSH 2.340 0.450 - 4.500 uIU/mL  Urinalysis, Routine w reflex microscopic  Result Value Ref Range   Specific Gravity, UA <1.005 (L) 1.005 - 1.030   pH, UA 6.0 5.0 - 7.5   Color, UA Yellow Yellow   Appearance Ur Clear Clear   Leukocytes,UA Trace (A) Negative   Protein,UA Negative Negative/Trace   Glucose, UA Negative Negative   Ketones, UA Negative Negative   RBC, UA Trace (A) Negative   Bilirubin, UA Negative Negative   Urobilinogen, Ur 0.2 0.2 - 1.0 mg/dL   Nitrite, UA Negative Negative   Microscopic Examination See below:   Microalbumin, Urine Waived  Result Value Ref Range   Microalb, Ur Waived 10 0 - 19 mg/L   Creatinine, Urine Waived 10 10 - 300 mg/dL   Microalb/Creat Ratio <30 <30 mg/g      Assessment & Plan:   Problem List Items Addressed This Visit      Cardiovascular and Mediastinum   Hypertension    Under good control on current regimen. Continue current regimen. Continue to monitor. Call with any concerns. Refills given. Labs drawn today.        Relevant Medications   metoprolol succinate (TOPROL-XL) 25 MG 24 hr tablet   losartan (COZAAR) 50 MG tablet     Endocrine   Subclinical hypothyroidism    Labs rechecked today. Await results.  Treat as needed.       Relevant Medications   metoprolol succinate (TOPROL-XL) 25 MG 24 hr tablet   Other Relevant Orders   TSH (Completed)    Other Visit Diagnoses    Routine general medical examination at a health care facility    -  Primary   Vaccines up to date. Screening labs checked today. Colonoscopy up to date. Continue diet and exercise. Call with any concerns.   Screening for cholesterol level       Labs drawn today.    Relevant Orders   Lipid Panel w/o Chol/HDL Ratio (Completed)   Need for influenza vaccination       Flu shot given today.    Relevant Orders   Flu Vaccine QUAD High Dose(Fluad) (Completed)   Left flank pain       Likely muscular. Discussed stretches. Call with any concerns.    Relevant Orders   DG Lumbar Spine Complete       Follow up plan: Return in about 6 months (around 02/02/2021).   LABORATORY TESTING:  - Pap smear: not applicable  IMMUNIZATIONS:   - Tdap: Tetanus vaccination status reviewed: last tetanus booster within 10 years. - Influenza: Administered today - Pneumovax: Up to date - Prevnar: Up to date - COVID: Up to date  SCREENING: -Mammogram: Ordered today  - Colonoscopy: Up to  date  - Bone Density: Up to date   PATIENT COUNSELING:   Advised to take 1 mg of folate supplement per day if capable of pregnancy.   Sexuality: Discussed sexually transmitted diseases, partner selection, use of condoms, avoidance of unintended pregnancy  and contraceptive alternatives.   Advised to avoid cigarette smoking.  I discussed with the patient that most people either abstain from alcohol or drink within safe limits (<=14/week and <=4 drinks/occasion for males, <=7/weeks and <= 3 drinks/occasion for females) and that the risk for alcohol disorders and other health effects rises proportionally with the number of drinks per week and how often a drinker exceeds daily limits.  Discussed cessation/primary prevention of drug use and availability of  treatment for abuse.   Diet: Encouraged to adjust caloric intake to maintain  or achieve ideal body weight, to reduce intake of dietary saturated fat and total fat, to limit sodium intake by avoiding high sodium foods and not adding table salt, and to maintain adequate dietary potassium and calcium preferably from fresh fruits, vegetables, and low-fat dairy products.    stressed the importance of regular exercise  Injury prevention: Discussed safety belts, safety helmets, smoke detector, smoking near bedding or upholstery.   Dental health: Discussed importance of regular tooth brushing, flossing, and dental visits.    NEXT PREVENTATIVE PHYSICAL DUE IN 1 YEAR. Return in about 6 months (around 02/02/2021).

## 2020-08-06 LAB — COMPREHENSIVE METABOLIC PANEL
ALT: 11 IU/L (ref 0–32)
AST: 23 IU/L (ref 0–40)
Albumin/Globulin Ratio: 1.7 (ref 1.2–2.2)
Albumin: 4.7 g/dL (ref 3.7–4.7)
Alkaline Phosphatase: 61 IU/L (ref 44–121)
BUN/Creatinine Ratio: 14 (ref 12–28)
BUN: 10 mg/dL (ref 8–27)
Bilirubin Total: 0.4 mg/dL (ref 0.0–1.2)
CO2: 24 mmol/L (ref 20–29)
Calcium: 9.6 mg/dL (ref 8.7–10.3)
Chloride: 100 mmol/L (ref 96–106)
Creatinine, Ser: 0.74 mg/dL (ref 0.57–1.00)
GFR calc Af Amer: 92 mL/min/{1.73_m2} (ref >59)
GFR calc non Af Amer: 80 mL/min/{1.73_m2} (ref >59)
Globulin, Total: 2.8 g/dL (ref 1.5–4.5)
Glucose: 101 mg/dL — ABNORMAL HIGH (ref 65–99)
Potassium: 4.2 mmol/L (ref 3.5–5.2)
Sodium: 138 mmol/L (ref 134–144)
Total Protein: 7.5 g/dL (ref 6.0–8.5)

## 2020-08-06 LAB — CBC WITH DIFFERENTIAL/PLATELET
Basophils Absolute: 0.1 10*3/uL (ref 0.0–0.2)
Basos: 1 %
EOS (ABSOLUTE): 0.2 10*3/uL (ref 0.0–0.4)
Eos: 2 %
Hematocrit: 39.3 % (ref 34.0–46.6)
Hemoglobin: 13.2 g/dL (ref 11.1–15.9)
Immature Grans (Abs): 0 10*3/uL (ref 0.0–0.1)
Immature Granulocytes: 0 %
Lymphocytes Absolute: 2.1 10*3/uL (ref 0.7–3.1)
Lymphs: 20 %
MCH: 31 pg (ref 26.6–33.0)
MCHC: 33.6 g/dL (ref 31.5–35.7)
MCV: 92 fL (ref 79–97)
Monocytes Absolute: 0.5 10*3/uL (ref 0.1–0.9)
Monocytes: 5 %
Neutrophils Absolute: 7.2 10*3/uL — ABNORMAL HIGH (ref 1.4–7.0)
Neutrophils: 72 %
Platelets: 271 10*3/uL (ref 150–450)
RBC: 4.26 x10E6/uL (ref 3.77–5.28)
RDW: 12.6 % (ref 11.7–15.4)
WBC: 10.1 10*3/uL (ref 3.4–10.8)

## 2020-08-06 LAB — LIPID PANEL W/O CHOL/HDL RATIO
Cholesterol, Total: 167 mg/dL (ref 100–199)
HDL: 77 mg/dL (ref >39)
LDL Chol Calc (NIH): 67 mg/dL (ref 0–99)
Triglycerides: 136 mg/dL (ref 0–149)
VLDL Cholesterol Cal: 23 mg/dL (ref 5–40)

## 2020-08-06 LAB — TSH: TSH: 2.34 u[IU]/mL (ref 0.450–4.500)

## 2020-08-08 ENCOUNTER — Ambulatory Visit: Payer: Medicare Other

## 2020-08-08 NOTE — Assessment & Plan Note (Signed)
Labs rechecked today. Await results. Treat as needed.

## 2020-08-15 ENCOUNTER — Ambulatory Visit (INDEPENDENT_AMBULATORY_CARE_PROVIDER_SITE_OTHER): Payer: Medicare Other

## 2020-08-15 VITALS — Ht 67.0 in | Wt 145.0 lb

## 2020-08-15 DIAGNOSIS — Z0000 Encounter for general adult medical examination without abnormal findings: Secondary | ICD-10-CM

## 2020-08-15 NOTE — Progress Notes (Signed)
I connected with Barbara Tucker today by telephone and verified that I am speaking with the correct person using two identifiers. Location patient: home Location provider: work Persons participating in the virtual visit: Barbara Tucker, Glenna Durand LPN.   I discussed the limitations, risks, security and privacy concerns of performing an evaluation and management service by telephone and the availability of in person appointments. I also discussed with the patient that there may be a patient responsible charge related to this service. The patient expressed understanding and verbally consented to this telephonic visit.    Interactive audio and video telecommunications were attempted between this provider and patient, however failed, due to patient having technical difficulties OR patient did not have access to video capability.  We continued and completed visit with audio only.     Vital signs may be patient reported or missing.  Subjective:   Barbara Tucker is a 74 y.o. female who presents for Medicare Annual (Subsequent) preventive examination.  Review of Systems     Cardiac Risk Factors include: advanced age (>63mn, >>34women);hypertension     Objective:    Today's Vitals   08/15/20 1341  Weight: 145 lb (65.8 kg)  Height: '5\' 7"'  (1.702 m)   Body mass index is 22.71 kg/m.  Advanced Directives 08/15/2020 09/08/2016 02/03/2016  Does Patient Have a Medical Advance Directive? Yes Yes Yes  Type of AParamed[place]of [place] will Healthcare [place] of [place]? No - copy requested - No - copy requested    Current Medications (verified) Outpatient Encounter Medications as of 08/15/2020  Medication Sig  . ascorbic acid (VITAMIN C) 500 MG tablet Take 500 mg by mouth daily.  . calcium carbonate (OSCAL) 1500 (600 Ca) MG TABS tablet Take 600 mg of elemental calcium by mouth 2 (two)  times daily with a meal.  . Calcium Polycarbophil (FIBER-CAPS PO) Take by mouth.  . dicyclomine (BENTYL) 10 MG capsule Take 10 mg by mouth 4 (four) times daily.  .Marland Kitchenestradiol (ESTRACE) 1 MG tablet Take 1 tablet (1 mg total) by mouth daily.  .Marland Kitchenetodolac (LODINE) 400 MG tablet Take 400 mg by mouth 2 (two) times daily.  . fexofenadine (ALLEGRA) 180 MG tablet Take 180 mg by mouth daily.  . folic acid (FOLVITE) 1 MG tablet Take 1 mg by mouth daily.  .Marland KitchenguaiFENesin (MUCINEX) 600 MG 12 hr tablet Take by mouth 2 (two) times daily.  . hydroxychloroquine (PLAQUENIL) 200 MG tablet Take 200 mg by mouth daily.   .Marland Kitchenlosartan (COZAAR) 50 MG tablet Take 1 tablet (50 mg total) by mouth daily.  . methotrexate (RHEUMATREX) 2.5 MG tablet Take 15 mg by mouth once a week.   . metoprolol succinate (TOPROL-XL) 25 MG 24 hr tablet TAKE 1 TABLET BY MOUTH DAILY  . Multiple Vitamin (MULTIVITAMIN) tablet Take 1 tablet by mouth daily.  . Omega-3 Fatty Acids (OMEGA-3 EPA FISH OIL PO) Take 340-1,000 mg by mouth daily.  .Marland Kitchenomeprazole (PRILOSEC) 20 MG capsule Take 20 mg by mouth daily.   No facility-administered encounter medications on file as of 08/15/2020.    Allergies (verified) Codeine, Lisinopril, and Sulfa antibiotics   History: Past Medical History:  Diagnosis Date  . Allergic rhinitis   . Allergy   . Endometriosis   . Heart murmur 1975   minimal, seen on ultrasound long ago  . Hypertension   . Hypothyroidism   . IBS (irritable bowel  syndrome)   . Migraine   . Osteoarthritis   . Rheumatoid arthritis [facility])    Past Surgical History:  Procedure Laterality Date  . ABDOMINAL HYSTERECTOMY    . APPENDECTOMY    . BREAST EXCISIONAL BIOPSY Right 1975   excisional - negative  . BREAST SURGERY  1975?   benign cyst removed  . COLONOSCOPY WITH PROPOFOL N/A 02/03/2016   Procedure: COLONOSCOPY WITH PROPOFOL;  Surgeon: Lollie Sails, MD;  Location: [facility] ENDOSCOPY;  Service: Endoscopy;  Laterality: N/A;  .  OOPHORECTOMY Bilateral   . TONSILLECTOMY    . TUBAL LIGATION     Family History  Problem Relation Age of Onset  . Breast cancer Paternal Grandmother   . Cancer Paternal Grandmother        Breast  . Heart disease Paternal Grandmother   . Stroke Mother   . Arthritis Mother        Osteo and rheumatoid  . Hypertension Mother   . Angina Mother   . Vision loss Mother   . Cancer Father        Multiple Myeloma  . Gout Father   . Cancer Sister        Breast  . Heart disease Sister   . Cancer Sister        Skin  . Cancer Brother        prostate   Social History   Socioeconomic History  . Marital status: Married    Spouse name: Not on file  . Number of children: Not on file  . Years of education: Not on file  . Highest education level: Not on file  Occupational History  . Occupation: retired  Tobacco Use  . Smoking status: Never Smoker  . Smokeless tobacco: Never Used  . Tobacco comment: never smoked, around lots of second hand  Vaping Use  . Vaping Use: Never used  Substance and Sexual Activity  . Alcohol use: Not Currently  . Drug use: No  . Sexual activity: Not Currently  Other Topics Concern  . Not on file  Social History Narrative  . Not on file   Social Determinants of Health   Financial Resource Strain: Low Risk   . Difficulty of Paying Living Expenses: Not hard at all  Food Insecurity: No Food Insecurity  . Worried About Charity fundraiser in the Last Year: Never true  . Ran Out of Food in the Last Year: Never true  Transportation Needs: No Transportation Needs  . Lack of Transportation (Medical): No  . Lack of Transportation (Non-Medical): No  Physical Activity: Sufficiently Active  . Days of Exercise per Week: 5 days  . Minutes of Exercise per Session: 30 min  Stress: No Stress Concern Present  . Feeling of Stress : Not at all  Social Connections:   . Frequency of Communication with Friends and Family: Not on file  . Frequency of Social Gatherings  with Friends and Family: Not on file  . Attends Religious Services: Not on file  . Active Member of Clubs or Organizations: Not on file  . Attends Archivist Meetings: Not on file  . Marital Status: Not on file    Tobacco Counseling Counseling given: Not Answered Comment: never smoked, around lots of second hand   Clinical Intake:  Pre-visit preparation completed: Yes  Pain : No/denies pain     Nutritional Status: BMI of 19-24  Normal Nutritional Risks: None Diabetes: No  How often do you need to have  someone help you when you read instructions, pamphlets, or other written materials from your doctor or pharmacy?: 1 - Never What is the last grade level you completed in school?: master's degree  Diabetic? no  Interpreter Needed?: No  Information entered by :: NAllen LPN   Activities of Daily Living In your present state of health, do you have any difficulty performing the following activities: 08/15/2020 08/05/2020  Hearing? N N  Vision? N N  Difficulty concentrating or making decisions? N N  Walking or climbing stairs? N N  Dressing or bathing? N N  Doing errands, shopping? N N  Preparing Food and eating ? N -  Using the Toilet? N -  In the past six months, have you accidently leaked urine? N -  Do you have problems with loss of bowel control? N -  Managing your Medications? N -  Managing your Finances? N -  Housekeeping or managing your Housekeeping? N -  Some recent data might be hidden    Patient Care Team: Valerie Roys, DO as PCP - General (Family Medicine)  Indicate any recent Medical Services you may have received from other than [facility] providers in the past year (date may be approximate).     Assessment:   This is a routine wellness examination for [facility]  Hearing/Vision screen  Hearing Screening   '125Hz'  '250Hz'  '500Hz'  '1000Hz'  '2000Hz'  '3000Hz'  '4000Hz'  '6000Hz'  '8000Hz'   Right ear:           Left ear:           Vision Screening Comments: Regular eye  exams, [place]  Dietary issues and exercise activities discussed: Current Exercise Habits: Home exercise routine, Type of exercise: walking (pilates), Time (Minutes): 30, Frequency (Times/Week): 5, Weekly Exercise (Minutes/Week): 150  Goals    . Patient Stated     08/15/2020, stay healthy      Depression Screen PHQ 2/9 Scores 08/15/2020 08/05/2020 07/27/2019 07/25/2018 05/25/2018  PHQ - 2 Score 0 0 '1 2 1  ' PHQ- 9 Score - - 3 3 -    Fall Risk Fall Risk  08/15/2020 08/05/2020 07/27/2019 01/23/2019 07/25/2018  Falls in the past year? 0 0 0 0 No  Number falls in past yr: - 0 - - -  Injury with Fall? - 0 0 - -  Risk for fall due to : Medication side effect No Fall Risks - - -  Follow up Falls evaluation completed;Education provided;Falls prevention discussed Falls evaluation completed - Falls evaluation completed -    Any stairs in or around the home? Yes  If so, are there any without handrails? No  Home free of loose throw rugs in walkways, pet beds, electrical cords, etc? Yes  Adequate lighting in your home to reduce risk of falls? Yes   ASSISTIVE DEVICES UTILIZED TO PREVENT FALLS:  Life alert? No  Use of a cane, walker or w/c? No  Grab bars in the bathroom? Yes  Shower chair or bench in shower? Yes  Elevated toilet seat or a handicapped toilet? No   TIMED UP AND GO:  Was the test performed? No .     Cognitive Function:     6CIT Screen 08/15/2020 07/27/2019 07/25/2018  What Year? 0 points 0 points 0 points  What month? 0 points 0 points 0 points  What time? 0 points 0 points 0 points  Count back from 20 0 points 0 points 0 points  Months in reverse 0 points 2 points 0 points  Repeat  phrase 0 points 0 points 0 points  Total Score 0 2 0    Immunizations Immunization History  Administered Date(s) Administered  . Fluad Quad(high Dose 65+) 07/27/2019, 08/05/2020  . Influenza, High Dose Seasonal PF 08/08/2018  . Influenza-Unspecified 07/24/2015, 09/17/2016  . PFIZER  SARS-COV-2 Vaccination 01/04/2020, 01/29/2020  . Pneumococcal Conjugate-13 09/06/2012, 11/30/2016  . Pneumococcal Polysaccharide-23 07/25/2018  . Td 07/25/2018  . Zoster 09/03/2010    TDAP status: Up to date Flu Vaccine status: Up to date Pneumococcal vaccine status: Up to date Covid-19 vaccine status: Completed vaccines  Qualifies for Shingles Vaccine? Yes   Zostavax completed Yes   Shingrix Completed?: No.    Education has been provided regarding the importance of this vaccine. Patient has been advised to call insurance company to determine out of pocket expense if they have not yet received this vaccine. Advised may also receive vaccine at local pharmacy or Health Dept. Verbalized acceptance and understanding.  Screening Tests Health Maintenance  Topic Date Due  . MAMMOGRAM  08/21/2021  . DEXA SCAN  08/21/2022  . COLONOSCOPY  02/02/2026  . TETANUS/TDAP  07/25/2028  . INFLUENZA VACCINE  Completed  . COVID-19 Vaccine  Completed  . Hepatitis C Screening  Completed  . PNA vac Low Risk Adult  Completed    Health Maintenance  There are no preventive care reminders to display for this patient.  Colorectal cancer screening: Completed 02/03/2016. Repeat every 10 years Mammogram status: scheduled Bone Density status: Completed 08/22/2019  Lung Cancer Screening: (Low Dose CT Chest recommended if Age 74-80 years, 30 pack-year currently smoking OR have quit w/in 15years.) does not qualify.   Lung Cancer Screening Referral: no  Additional Screening:  Hepatitis C Screening: does qualify; Completed 05/26/2018  Vision Screening: Recommended annual ophthalmology exams for early detection of glaucoma and other disorders of the eye. Is the patient up to date with their annual eye exam?  Yes  Who is the provider or what is the name of the office in which the patient attends annual eye exams? [facility] If pt is not established with a provider, would they like to be referred to a  provider to establish care? No .   Dental Screening: Recommended annual dental exams for proper oral hygiene  Community Resource Referral / Chronic Care Management: CRR required this visit?  No   CCM required this visit?  No      Plan:     I have personally reviewed and noted the following in the patient's chart:   . Medical and social history . Use of alcohol, tobacco or illicit drugs  . Current medications and supplements . Functional ability and status . Nutritional status . Physical activity . Advanced directives . List of other physicians . Hospitalizations, surgeries, and ER visits in previous 12 months . Vitals . Screenings to include cognitive, depression, and falls . Referrals and appointments  In addition, I have reviewed and discussed with patient certain preventive protocols, quality metrics, and best practice recommendations. A written personalized care plan for preventive services as well as general preventive health recommendations were provided to patient.     Kellie Simmering, LPN   [id number]   Nurse Notes:

## 2020-08-15 NOTE — Patient Instructions (Signed)
Ms. Barbara Tucker , Thank you for taking time to come for your Medicare Wellness Visit. I appreciate your ongoing commitment to your health goals. Please review the following plan we discussed and let me know if I can assist you in the future.   Screening recommendations/referrals: Colonoscopy: completed 02/03/2016 Mammogram: completed 08/22/2019 Bone Density: completed 08/22/2019 Recommended yearly ophthalmology/optometry visit for glaucoma screening and checkup Recommended yearly dental visit for hygiene and checkup  Vaccinations: Influenza vaccine: completed 08/05/2020 Pneumococcal vaccine: completed 07/25/2018 Tdap vaccine: completed 07/25/2018 Shingles vaccine: discussed   Covid-19: 01/29/2020, 01/04/2020  Advanced directives: Please bring a copy of your POA (Power of Attorney) and/or Living Will to your next appointment.   Conditions/risks identified: none  Next appointment: Follow up in one year for your annual wellness visit    Preventive Care 65 Years and Older, Female Preventive care refers to lifestyle choices and visits with your health care provider that can promote health and wellness. What does preventive care include?  A yearly physical exam. This is also called an annual well check.  Dental exams once or twice a year.  Routine eye exams. Ask your health care provider how often you should have your eyes checked.  Personal lifestyle choices, including:  Daily care of your teeth and gums.  Regular physical activity.  Eating a healthy diet.  Avoiding tobacco and drug use.  Limiting alcohol use.  Practicing safe sex.  Taking low-dose aspirin every day.  Taking vitamin and mineral supplements as recommended by your health care provider. What happens during an annual well check? The services and screenings done by your health care provider during your annual well check will depend on your age, overall health, lifestyle risk factors, and family history of  disease. Counseling  Your health care provider may ask you questions about your:  Alcohol use.  Tobacco use.  Drug use.  Emotional well-being.  Home and relationship well-being.  Sexual activity.  Eating habits.  History of falls.  Memory and ability to understand (cognition).  Work and work Astronomer.  Reproductive health. Screening  You may have the following tests or measurements:  Height, weight, and BMI.  Blood pressure.  Lipid and cholesterol levels. These may be checked every 5 years, or more frequently if you are over 59 years old.  Skin check.  Lung cancer screening. You may have this screening every year starting at age 71 if you have a 30-pack-year history of smoking and currently smoke or have quit within the past 15 years.  Fecal occult blood test (FOBT) of the stool. You may have this test every year starting at age 55.  Flexible sigmoidoscopy or colonoscopy. You may have a sigmoidoscopy every 5 years or a colonoscopy every 10 years starting at age 4.  Hepatitis C blood test.  Hepatitis B blood test.  Sexually transmitted disease (STD) testing.  Diabetes screening. This is done by checking your blood sugar (glucose) after you have not eaten for a while (fasting). You may have this done every 1-3 years.  Bone density scan. This is done to screen for osteoporosis. You may have this done starting at age 74.  Mammogram. This may be done every 1-2 years. Talk to your health care provider about how often you should have regular mammograms. Talk with your health care provider about your test results, treatment options, and if necessary, the need for more tests. Vaccines  Your health care provider may recommend certain vaccines, such as:  Influenza vaccine. This is recommended every  year.  Tetanus, diphtheria, and acellular pertussis (Tdap, Td) vaccine. You may need a Td booster every 10 years.  Zoster vaccine. You may need this after age  42.  Pneumococcal 13-valent conjugate (PCV13) vaccine. One dose is recommended after age 54.  Pneumococcal polysaccharide (PPSV23) vaccine. One dose is recommended after age 89. Talk to your health care provider about which screenings and vaccines you need and how often you need them. This information is not intended to replace advice given to you by your health care provider. Make sure you discuss any questions you have with your health care provider. Document Released: 11/21/2015 Document Revised: 07/14/2016 Document Reviewed: 08/26/2015 Elsevier Interactive Patient Education  2017 [place] Prevention in the Home Falls can cause injuries. They can happen to people of all ages. There are many things you can do to make your home safe and to help prevent falls. What can I do on the outside of my home?  Regularly fix the edges of walkways and driveways and fix any cracks.  Remove anything that might make you trip as you walk through a door, such as a raised step or threshold.  Trim any bushes or trees on the path to your home.  Use bright outdoor lighting.  Clear any walking paths of anything that might make someone trip, such as rocks or tools.  Regularly check to see if handrails are loose or broken. Make sure that both sides of any steps have handrails.  Any raised decks and porches should have guardrails on the edges.  Have any leaves, snow, or ice cleared regularly.  Use sand or salt on walking paths during winter.  Clean up any spills in your garage right away. This includes oil or grease spills. What can I do in the bathroom?  Use night lights.  Install grab bars by the toilet and in the tub and shower. Do not use towel bars as grab bars.  Use non-skid mats or decals in the tub or shower.  If you need to sit down in the shower, use a plastic, non-slip stool.  Keep the floor dry. Clean up any water that spills on the floor as soon as it happens.  Remove  soap buildup in the tub or shower regularly.  Attach bath mats securely with double-sided non-slip rug tape.  Do not have throw rugs and other things on the floor that can make you trip. What can I do in the bedroom?  Use night lights.  Make sure that you have a light by your bed that is easy to reach.  Do not use any sheets or blankets that are too big for your bed. They should not hang down onto the floor.  Have a firm chair that has side arms. You can use this for support while you get dressed.  Do not have throw rugs and other things on the floor that can make you trip. What can I do in the kitchen?  Clean up any spills right away.  Avoid walking on wet floors.  Keep items that you use a lot in easy-to-reach places.  If you need to reach something above you, use a strong step stool that has a grab bar.  Keep electrical cords out of the way.  Do not use floor polish or wax that makes floors slippery. If you must use wax, use non-skid floor wax.  Do not have throw rugs and other things on the floor that can make you trip. What can  I do with my stairs?  Do not leave any items on the stairs.  Make sure that there are handrails on both sides of the stairs and use them. Fix handrails that are broken or loose. Make sure that handrails are as long as the stairways.  Check any carpeting to make sure that it is firmly attached to the stairs. Fix any carpet that is loose or worn.  Avoid having throw rugs at the top or bottom of the stairs. If you do have throw rugs, attach them to the floor with carpet tape.  Make sure that you have a light switch at the top of the stairs and the bottom of the stairs. If you do not have them, ask someone to add them for you. What else can I do to help prevent falls?  Wear shoes that:  Do not have high heels.  Have rubber bottoms.  Are comfortable and fit you well.  Are closed at the toe. Do not wear sandals.  If you use a  stepladder:  Make sure that it is fully opened. Do not climb a closed stepladder.  Make sure that both sides of the stepladder are locked into place.  Ask someone to hold it for you, if possible.  Clearly mark and make sure that you can see:  Any grab bars or handrails.  First and last steps.  Where the edge of each step is.  Use tools that help you move around (mobility aids) if they are needed. These include:  Canes.  Walkers.  Scooters.  Crutches.  Turn on the lights when you go into a dark area. Replace any light bulbs as soon as they burn out.  Set up your furniture so you have a clear path. Avoid moving your furniture around.  If any of your floors are uneven, fix them.  If there are any pets around you, be aware of where they are.  Review your medicines with your doctor. Some medicines can make you feel dizzy. This can increase your chance of falling. Ask your doctor what other things that you can do to help prevent falls. This information is not intended to replace advice given to you by your health care provider. Make sure you discuss any questions you have with your health care provider. Document Released: 08/21/2009 Document Revised: 04/01/2016 Document Reviewed: 11/29/2014 Elsevier Interactive Patient Education  2017 [employer].

## 2020-08-20 ENCOUNTER — Ambulatory Visit
Admission: RE | Admit: 2020-08-20 | Discharge: 2020-08-20 | Disposition: A | Discharge status: Home / Self Care | Payer: Medicare Other | Source: Ambulatory Visit | Attending: Family Medicine | Admitting: Family Medicine

## 2020-08-20 ENCOUNTER — Ambulatory Visit
Admission: RE | Admit: 2020-08-20 | Discharge: 2020-08-20 | Disposition: A | Discharge status: Home / Self Care | Payer: Medicare Other | Attending: Family Medicine | Admitting: Family Medicine

## 2020-08-20 ENCOUNTER — Other Ambulatory Visit: Payer: Self-pay

## 2020-08-20 DIAGNOSIS — R109 Unspecified abdominal pain: Secondary | ICD-10-CM | POA: Insufficient documentation

## 2020-08-22 ENCOUNTER — Other Ambulatory Visit: Payer: Self-pay

## 2020-08-22 ENCOUNTER — Ambulatory Visit
Admission: RE | Admit: 2020-08-22 | Discharge: 2020-08-22 | Disposition: A | Discharge status: Home / Self Care | Payer: Medicare Other | Source: Ambulatory Visit | Attending: Family Medicine | Admitting: Family Medicine

## 2020-08-22 DIAGNOSIS — Z1231 Encounter for screening mammogram for malignant neoplasm of breast: Secondary | ICD-10-CM | POA: Insufficient documentation

## 2020-08-25 ENCOUNTER — Encounter: Payer: Self-pay | Admitting: Family Medicine

## 2020-09-30 ENCOUNTER — Ambulatory Visit: Payer: Medicare Other

## 2021-02-03 ENCOUNTER — Ambulatory Visit (INDEPENDENT_AMBULATORY_CARE_PROVIDER_SITE_OTHER): Payer: Medicare Other | Admitting: Family Medicine

## 2021-02-03 ENCOUNTER — Other Ambulatory Visit: Payer: Self-pay

## 2021-02-03 ENCOUNTER — Encounter: Payer: Self-pay | Admitting: Family Medicine

## 2021-02-03 ENCOUNTER — Telehealth: Payer: Self-pay

## 2021-02-03 VITALS — BP 133/82 | HR 64 | Temp 98.2°F | Wt 144.2 lb

## 2021-02-03 DIAGNOSIS — M25511 Pain in right shoulder: Secondary | ICD-10-CM | POA: Diagnosis not present

## 2021-02-03 DIAGNOSIS — I10 Essential (primary) hypertension: Secondary | ICD-10-CM

## 2021-02-03 DIAGNOSIS — R202 Paresthesia of skin: Secondary | ICD-10-CM | POA: Diagnosis not present

## 2021-02-03 DIAGNOSIS — E038 Other specified hypothyroidism: Secondary | ICD-10-CM | POA: Diagnosis not present

## 2021-02-03 DIAGNOSIS — M059 Rheumatoid arthritis with rheumatoid factor, unspecified: Secondary | ICD-10-CM

## 2021-02-03 MED ORDER — LOSARTAN POTASSIUM 50 MG PO TABS: 50.0000 mg | ORAL_TABLET | Freq: Every day | ORAL | 1 refills | Status: DC

## 2021-02-03 MED ORDER — ESTRADIOL 1 MG PO TABS: 1.0000 mg | ORAL_TABLET | Freq: Every day | ORAL | 1 refills | Status: DC

## 2021-02-03 MED ORDER — METOPROLOL SUCCINATE ER 25 MG PO TB24
ORAL_TABLET | ORAL | 1 refills | Status: DC
Start: 2021-02-03 — End: 2021-06-24

## 2021-02-03 NOTE — Patient Instructions (Addendum)
Voltaren gel  Cervical Strain and Sprain Rehab Ask your health care provider which exercises are safe for you. Do exercises exactly as told by your health care provider and adjust them as directed. It is normal to feel mild stretching, pulling, tightness, or discomfort as you do these exercises. Stop right away if you feel sudden pain or your pain gets worse. Do not begin these exercises until told by your health care provider. Stretching and range-of-motion exercises Cervical side bending 1. Using good posture, sit on a stable chair or stand up. 2. Without moving your shoulders, slowly tilt your left / right ear to your shoulder until you feel a stretch in the opposite side neck muscles. You should be looking straight ahead. 3. Hold for __________ seconds. 4. Repeat with the other side of your neck. Repeat __________ times. Complete this exercise __________ times a day.   Cervical rotation 1. Using good posture, sit on a stable chair or stand up. 2. Slowly turn your head to the side as if you are looking over your left / right shoulder. ? Keep your eyes level with the ground. ? Stop when you feel a stretch along the side and the back of your neck. 3. Hold for __________ seconds. 4. Repeat this by turning to your other side. Repeat __________ times. Complete this exercise __________ times a day.   Thoracic extension and pectoral stretch 1. Roll a towel or a small blanket so it is about 4 inches (10 cm) in diameter. 2. Lie down on your back on a firm surface. 3. Put the towel lengthwise, under your spine in the middle of your back. It should not be under your shoulder blades. The towel should line up with your spine from your middle back to your lower back. 4. Put your hands behind your head and let your elbows fall out to your sides. 5. Hold for __________ seconds. Repeat __________ times. Complete this exercise __________ times a day. Strengthening exercises Isometric upper cervical  flexion 1. Lie on your back with a thin pillow behind your head and a small rolled-up towel under your neck. 2. Gently tuck your chin toward your chest and nod your head down to look toward your feet. Do not lift your head off the pillow. 3. Hold for __________ seconds. 4. Release the tension slowly. Relax your neck muscles completely before you repeat this exercise. Repeat __________ times. Complete this exercise __________ times a day. Isometric cervical extension 1. Stand about 6 inches (15 cm) away from a wall, with your back facing the wall. 2. Place a soft object, about 6-8 inches (15-20 cm) in diameter, between the back of your head and the wall. A soft object could be a small pillow, a ball, or a folded towel. 3. Gently tilt your head back and press into the soft object. Keep your jaw and forehead relaxed. 4. Hold for __________ seconds. 5. Release the tension slowly. Relax your neck muscles completely before you repeat this exercise. Repeat __________ times. Complete this exercise __________ times a day.   Posture and body mechanics Body mechanics refers to the movements and positions of your body while you do your daily activities. Posture is part of body mechanics. Good posture and healthy body mechanics can help to relieve stress in your body's tissues and joints. Good posture means that your spine is in its natural S-curve position (your spine is neutral), your shoulders are pulled back slightly, and your head is not tipped forward. The following  are general guidelines for applying improved posture and body mechanics to your everyday activities. Sitting 1. When sitting, keep your spine neutral and keep your feet flat on the floor. Use a footrest, if necessary, and keep your thighs parallel to the floor. Avoid rounding your shoulders, and avoid tilting your head forward. 2. When working at a desk or a computer, keep your desk at a height where your hands are slightly lower than your  elbows. Slide your chair under your desk so you are close enough to maintain good posture. 3. When working at a computer, place your monitor at a height where you are looking straight ahead and you do not have to tilt your head forward or downward to look at the screen.   Standing  When standing, keep your spine neutral and keep your feet about hip-width apart. Keep a slight bend in your knees. Your ears, shoulders, and hips should line up.  When you do a task in which you stand in one place for a long time, place one foot up on a stable object that is 2-4 inches (5-10 cm) high, such as a footstool. This helps keep your spine neutral.   Resting When lying down and resting, avoid positions that are most painful for you. Try to support your neck in a neutral position. You can use a contour pillow or a small rolled-up towel. Your pillow should support your neck but not push on it. This information is not intended to replace advice given to you by your health care provider. Make sure you discuss any questions you have with your health care provider. Document Revised: 02/14/2019 Document Reviewed: 07/26/2018 Elsevier Patient Education  2021 [employer].  Essentials of Physical Medicine and Rehabilitation (4th ed., pp. [phone]). [place], [place]: Elsevier.">  Thoracic Outlet Syndrome Rehab Ask your health care provider which exercises are safe for you. Do exercises exactly as told by your health care provider and adjust them as directed. It is normal to feel mild stretching, pulling, tightness, or discomfort as you do these exercises. Stop right away if you feel sudden pain or your pain gets worse. Do not begin these exercises until told by your health care provider. Stretching and range-of-motion exercises These exercises warm up your muscles and joints and improve the movement and flexibility of your shoulder. These exercises also help to relieve pain, numbness, and tingling. Gentle chest stretch  with belly breathing 5. On the floor, place a half foam roller or a bath towel that is rolled up lengthwise. 6. Lie on your back so your spine--all the way from your head to your tailbone--is on top of the foam roller or towel. Relax. You should feel a gentle stretch across your upper chest. ? Keep both feet or heels firmly on the floor. ? You may bend your knees for comfort. ? Let your arms fall naturally to your sides. 7. Breathe deeply and slowly from your belly. You should feel your belly rise each time you breathe in (inhale). Breathe out (exhale) completely before you inhale again. 8. Stay in this position and continue to inhale and exhale for __________ seconds. ? Over time, gradually increase the amount of time that you hold this stretch, as told by your health care provider. Repeat __________ times. Complete this exercise __________ times a day. Scalene stretches There are 3 types of scalene stretches. They vary depending on which direction you are looking while you do the exercise. Your health care provider will tell you which type  of scalene stretch to do. 5. Lie on your back. Place the hand from your left / right side under the hip of your left / right side. For example, if your right shoulder is injured, place your right hand under your right hip. 6. Gently and slowly tilt your head toward your healthy shoulder. Follow instructions from your health care provider about which direction you should turn your face while you tilt your head: ? Posterior scalene stretch: Turn your face toward your healthy shoulder, in the same direction that you tilt your head. ? Middle scalene stretch: Turn your face toward the ceiling while you tilt your head toward your healthy shoulder. ? Anterior scalene stretch: Turn your face toward your injured shoulder. This means that you tilt your head one direction (toward your healthy shoulder), but you turn your face in the opposite direction. 7. Hold each stretch  for __________ seconds. Repeat __________ times. Complete this exercise __________ times a day. Chin tuck This exercise is sometimes called axial extension. 6. Using good posture, sit on a stable surface or stand up. If you have trouble keeping good posture, rest your back and head against a stable wall during this exercise. 7. Look straight ahead and slowly move your chin back, toward your neck, until you feel a stretch in the back of your head. ? Your head should slide back. ? Your chin should be slightly lowered. 8. Hold for __________ seconds. 9. Return to the starting position. Repeat __________ times. Complete this exercise __________ times a day.   Shoulder rolls 5. Sit in a sturdy chair or stand up. Keep good posture during the exercise. 6. Shrug your shoulders up toward your ears and gently move your shoulders around in circles going forward. 7. Reverse the direction of your shoulder rolls so you are moving your shoulders around in circles going backward. Repeat __________ times. Complete this exercise __________ times a day.   Chest stretch This exercise is sometimes called external rotation and abduction. 6. Stand in a doorway with one of your feet slightly in front of the other. This is called a staggered stance. If you cannot reach your forearms to the door frame, do this exercise in a corner of a room. 7. Move your hands away from the center of your body (abduction) by choosing one of the following positions (external rotation): ? Place your hands and forearms on the door frame above your head. ? Place your hands and forearms on the door frame at the height of your head. ? Place your hands on the door frame at the height of your elbows. 8. Slowly move your weight onto your front foot until you feel a stretch across your chest and in the front of your shoulders. Keep your head and chest upright and keep your abdominal muscles tight. 9. Hold for __________ seconds. 10. To release  the stretch, shift your weight to your back foot. Repeat __________ times. Complete this exercise __________ times a day.   Strengthening exercise This exercise builds strength and endurance in your shoulder. Endurance is the ability to use your muscles for a long time, even after they get tired. Scapular retraction 4. Sit in a stable chair without armrests, or stand up. 5. Secure an exercise band to a stable object in front of you so the band is at shoulder height. 6. Hold one end of the band in each hand. Your palms should face down. 7. Straighten your elbows and lift your arms up to shoulder  height. 8. Step back, away from the secured end of the band, until the band has no slack. 9. Squeeze your shoulder blades (scapulae) together and pull your elbows back behind you (retraction). ? Your elbows should be at about chest or shoulder height. ? Keep your upper arms lifted, away from your sides. 10. Hold for __________ seconds. 11. Slowly return to the starting position. Repeat __________ times. Complete this exercise __________ times a day.   Posture and body mechanics Good posture and healthy body mechanics can help to relieve stress in your body's tissues and joints. Body mechanics refers to the movements and positions of your body while you do your daily activities. Posture is part of body mechanics. Good posture means:  Your spine is in its natural S-curve position (neutral).  Your shoulders are pulled back slightly.  Your head is not tipped forward. Follow these guidelines to improve your posture and body mechanics in your everyday activities. Standing  When standing, keep your spine neutral and your feet about hip width apart. Keep a slight bend in your knees. Your ears, shoulders, and hips should line up with each other.  When you do a task in which you stand in one place for a long time, place one foot up on a stable object that is 2-4 inches (5-10 cm) high, such as a footstool.  This helps keep your spine neutral.   Sitting  When sitting, keep your spine neutral and keep your feet flat on the floor. Use a footrest, if necessary, and keep your thighs parallel to the floor. Avoid rounding your shoulders, and avoid tilting your head forward.  When working at a desk or a computer, keep your desk at a height where your hands are slightly lower than your elbows. Slide your chair under your desk so you are close enough to maintain good posture.  When working at a computer, place your monitor at a height where you are looking straight ahead and you do not have to tilt your head forward or downward to look at the screen.   Resting  When lying down and resting, avoid positions that are most painful for you.  If you have pain with activities such as sitting, bending, stooping, or squatting (flexion-based activities), lie in a position in which your body does not bend very much. For example, avoid curling up on your side with your arms and knees near your chest (fetal position).  If you have pain with activities such as standing for a long time or reaching with your arms (extension-based activities), lie with your spine in a neutral position and bend your knees slightly. Try the following positions: ? Lying on your side with a pillow between your knees. ? Lying on your back with a pillow under your knees.   This information is not intended to replace advice given to you by your health care provider. Make sure you discuss any questions you have with your health care provider. Document Revised: 07/30/2019 Document Reviewed: 12/05/2018 Elsevier Patient Education  2021 [employer].

## 2021-02-03 NOTE — Telephone Encounter (Signed)
Copied from CRM [id number]. Topic: Quick Communication - Appointment Cancellation >> Feb 03, 2021  3:50 PM Aretta Nip wrote: Patient wants to cancel appointment scheduled for [facility] on Oct 10th. Please Have [facility] Nurse resch by calling pt at  [phone]  Route to department's PEC pool.

## 2021-02-03 NOTE — Progress Notes (Signed)
BP 133/82   Pulse 64   Temp 98.2 F (36.8 C)   Wt 144 lb 3.2 oz (65.4 kg)   SpO2 98%   BMI 22.58 kg/m    Subjective:    Patient ID: Barbara Tucker, female    DOB: 1946/10/01, 75 y.o.   MRN: [id number]  HPI: Barbara Tucker is a 75 y.o. female  Chief Complaint  Patient presents with  . Hypertension   She sleeps on her side and has recently been bothered by pain in her R shoulder when she is laying on it. She notes that it becomes sore and wakes her up at night. She notes that she will occasionally have some tingling her arm also associated with this.   NUMBNESS Duration: few weeks Onset: gradual Location: R arm and entire R hand Bilateral: no Symmetric: no Decreased sensation: no  Weakness: no Pain: no Quality:  tingling Severity: mild to moderate  Frequency: constant, waxing and waning Trauma: no Recent illness: no Diabetes: no Thyroid disease: yes  HIV: no  Alcoholism: no  Spinal cord injury: no Status: exacerbated Treatments attempted: none  HYPERTENSION Hypertension status: controlled  Satisfied with current treatment? yes Duration of hypertension: chronic BP monitoring frequency:  not checking BP medication side effects:  no Medication compliance: excellent compliance Previous BP meds: losartan, metoprolol Aspirin: no Recurrent headaches: no Visual changes: no Palpitations: no Dyspnea: no Chest pain: no Lower extremity edema: no Dizzy/lightheaded: no  HYPOTHYROIDISM Thyroid control status:controlled Satisfied with current treatment? yes Medication side effects: no Medication compliance: excellent compliance Recent dose adjustment:no Fatigue: no Cold intolerance: no Heat intolerance: no Weight gain: no Weight loss: no Constipation: no Diarrhea/loose stools: no Palpitations: no Lower extremity edema: no Anxiety/depressed mood: no  Relevant past medical, surgical, family and social history reviewed and updated as indicated.  Interim medical history since our last visit reviewed. Allergies and medications reviewed and updated.  Review of Systems  Constitutional: Negative.   Respiratory: Negative.   Cardiovascular: Negative.   Gastrointestinal: Negative.   Musculoskeletal: Negative.   Psychiatric/Behavioral: Negative.     Per HPI unless specifically indicated above     Objective:    BP 133/82   Pulse 64   Temp 98.2 F (36.8 C)   Wt 144 lb 3.2 oz (65.4 kg)   SpO2 98%   BMI 22.58 kg/m   Wt Readings from Last 3 Encounters:  02/03/21 144 lb 3.2 oz (65.4 kg)  08/15/20 145 lb (65.8 kg)  08/05/20 144 lb (65.3 kg)    Physical Exam Vitals and nursing note reviewed.  Constitutional:      General: She is not in acute distress.    Appearance: Normal appearance. She is not ill-appearing, toxic-appearing or diaphoretic.  HENT:     Head: Normocephalic and atraumatic.     Right Ear: External ear normal.     Left Ear: External ear normal.     Nose: Nose normal.     Mouth/Throat:     Mouth: Mucous membranes are moist.     Pharynx: Oropharynx is clear.  Eyes:     General: No scleral icterus.       Right eye: No discharge.        Left eye: No discharge.     Extraocular Movements: Extraocular movements intact.     Conjunctiva/sclera: Conjunctivae normal.     Pupils: Pupils are equal, round, and reactive to light.  Cardiovascular:     Rate and Rhythm: Normal rate and regular rhythm.  Pulses: Normal pulses.     Heart sounds: Normal heart sounds. No murmur heard. No friction rub. No gallop.   Pulmonary:     Effort: Pulmonary effort is normal. No respiratory distress.     Breath sounds: Normal breath sounds. No stridor. No wheezing, rhonchi or rales.  Chest:     Chest wall: No tenderness.  Musculoskeletal:        General: Normal range of motion.     Cervical back: Normal range of motion and neck supple.  Skin:    General: Skin is warm and dry.     Capillary Refill: Capillary refill takes less  than 2 seconds.     Coloration: Skin is not jaundiced or pale.     Findings: No bruising, erythema, lesion or rash.  Neurological:     General: No focal deficit present.     Mental Status: She is alert and oriented to person, place, and time. Mental status is at baseline.  Psychiatric:        Mood and Affect: Mood normal.        Behavior: Behavior normal.        Thought Content: Thought content normal.        Judgment: Judgment normal.     Results for orders placed or performed in visit on 51/76/16  Basic metabolic panel  Result Value Ref Range   Glucose 104 (H) 65 - 99 mg/dL   BUN 12 8 - 27 mg/dL   Creatinine, Ser 0.76 0.57 - 1.00 mg/dL   eGFR 82 >59 mL/min/1.73   BUN/Creatinine Ratio 16 12 - 28   Sodium 137 134 - 144 mmol/L   Potassium 4.3 3.5 - 5.2 mmol/L   Chloride 99 96 - 106 mmol/L   CO2 25 20 - 29 mmol/L   Calcium 9.9 8.7 - 10.3 mg/dL  TSH  Result Value Ref Range   TSH 2.610 0.450 - 4.500 uIU/mL      Assessment & Plan:   Problem List Items Addressed This Visit      Cardiovascular and Mediastinum   Hypertension    Under good control on current regimen. Continue current regimen. Continue to monitor. Call with any concerns. Refills given. Labs drawn today.        Relevant Medications   metoprolol succinate (TOPROL-XL) 25 MG 24 hr tablet   losartan (COZAAR) 50 MG tablet   Other Relevant Orders   Basic metabolic panel (Completed)     Endocrine   Subclinical hypothyroidism    Rechecking labs today. Await results. Call with any concerns.       Relevant Medications   metoprolol succinate (TOPROL-XL) 25 MG 24 hr tablet   Other Relevant Orders   TSH (Completed)     Musculoskeletal and Integument   Rheumatoid arthritis ([place])    Follows with Dr. Luane School. Continue to monitor. Call with any concerns.        Other Visit Diagnoses    Acute pain of right shoulder    -  Primary   Will start her with some stretches and voltaren. If not improving will get her an  x-ray and possible PT. Call with any concerns. Continue to monitor.   Arm paresthesia, right       Will start her with some stretches and voltaren. If not improving will get her an x-ray and possible PT. Call with any concerns. Continue to monitor.   Essential hypertension       Relevant Medications   metoprolol succinate (TOPROL-XL)  25 MG 24 hr tablet   losartan (COZAAR) 50 MG tablet       Follow up plan: Return in about 6 months (around 08/06/2021).

## 2021-02-04 LAB — BASIC METABOLIC PANEL
BUN/Creatinine Ratio: 16 (ref 12–28)
BUN: 12 mg/dL (ref 8–27)
CO2: 25 mmol/L (ref 20–29)
Calcium: 9.9 mg/dL (ref 8.7–10.3)
Chloride: 99 mmol/L (ref 96–106)
Creatinine, Ser: 0.76 mg/dL (ref 0.57–1.00)
Glucose: 104 mg/dL — ABNORMAL HIGH (ref 65–99)
Potassium: 4.3 mmol/L (ref 3.5–5.2)
Sodium: 137 mmol/L (ref 134–144)
eGFR: 82 mL/min/{1.73_m2} (ref >59)

## 2021-02-04 LAB — TSH: TSH: 2.61 u[IU]/mL (ref 0.450–4.500)

## 2021-02-04 NOTE — Assessment & Plan Note (Signed)
Follows with Dr. Baron Sane. Continue to monitor. Call with any concerns.

## 2021-02-04 NOTE — Assessment & Plan Note (Signed)
Rechecking labs today. Await results. Call with any concerns.  

## 2021-02-04 NOTE — Assessment & Plan Note (Signed)
Under good control on current regimen. Continue current regimen. Continue to monitor. Call with any concerns. Refills given. Labs drawn today.   

## 2021-03-13 ENCOUNTER — Other Ambulatory Visit: Payer: Self-pay

## 2021-03-13 ENCOUNTER — Ambulatory Visit: Payer: Medicare Other | Attending: Internal Medicine

## 2021-03-13 DIAGNOSIS — Z23 Encounter for immunization: Secondary | ICD-10-CM

## 2021-03-13 MED ORDER — PFIZER-BIONT COVID-19 VAC-TRIS 30 MCG/0.3ML IM SUSP
INTRAMUSCULAR | 0 refills | Status: DC
  Filled 2021-03-13: qty 0.3, 1d supply, fill #0

## 2021-03-13 NOTE — Progress Notes (Signed)
   Covid-19 Vaccination Clinic  Name:  Barbara Tucker    MRN: [id number] DOB: [birth year]  03/13/2021  [name] was observed post Covid-19 immunization for 15 minutes without incident. She was provided with Vaccine Information Sheet and instruction to access the V-Safe system.   [name] was instructed to call 911 with any severe reactions post vaccine: Marland Kitchen Difficulty breathing  . Swelling of face and throat  . A fast heartbeat  . A bad rash all over body  . Dizziness and weakness   Immunizations Administered    Name Date Dose VIS Date Route   PFIZER Comrnaty(Gray TOP) Covid-19 Vaccine 03/13/2021  1:27 PM 0.3 mL 10/16/2020 Intramuscular   Manufacturer: [employer], [employer]   Lot: [id number]   NDC: [phone]

## 2021-04-22 ENCOUNTER — Encounter: Payer: Self-pay | Admitting: Family Medicine

## 2021-05-01 ENCOUNTER — Encounter: Payer: Self-pay | Admitting: Family Medicine

## 2021-06-24 ENCOUNTER — Other Ambulatory Visit: Payer: Self-pay | Admitting: Family Medicine

## 2021-06-24 NOTE — Telephone Encounter (Signed)
Appointment 10/4- patient will run out before that appointment-refill granted

## 2021-07-09 ENCOUNTER — Encounter: Payer: Self-pay | Admitting: Emergency Medicine

## 2021-07-09 ENCOUNTER — Observation Stay
Admission: EM | Admit: 2021-07-09 | Discharge: 2021-07-10 | Disposition: A | Discharge status: Home / Self Care | Payer: Medicare Other | Attending: Internal Medicine | Admitting: Internal Medicine

## 2021-07-09 ENCOUNTER — Inpatient Hospital Stay: Payer: Medicare Other

## 2021-07-09 ENCOUNTER — Other Ambulatory Visit: Payer: Self-pay

## 2021-07-09 DIAGNOSIS — Z79899 Other long term (current) drug therapy: Secondary | ICD-10-CM | POA: Diagnosis not present

## 2021-07-09 DIAGNOSIS — E039 Hypothyroidism, unspecified: Secondary | ICD-10-CM | POA: Insufficient documentation

## 2021-07-09 DIAGNOSIS — I214 Non-ST elevation (NSTEMI) myocardial infarction: Principal | ICD-10-CM | POA: Diagnosis present

## 2021-07-09 DIAGNOSIS — I10 Essential (primary) hypertension: Secondary | ICD-10-CM | POA: Diagnosis not present

## 2021-07-09 DIAGNOSIS — M059 Rheumatoid arthritis with rheumatoid factor, unspecified: Secondary | ICD-10-CM

## 2021-07-09 DIAGNOSIS — Z20822 Contact with and (suspected) exposure to covid-19: Secondary | ICD-10-CM | POA: Diagnosis not present

## 2021-07-09 DIAGNOSIS — I5181 Takotsubo syndrome: Secondary | ICD-10-CM | POA: Diagnosis present

## 2021-07-09 DIAGNOSIS — R55 Syncope and collapse: Secondary | ICD-10-CM | POA: Diagnosis not present

## 2021-07-09 DIAGNOSIS — R0602 Shortness of breath: Secondary | ICD-10-CM | POA: Insufficient documentation

## 2021-07-09 LAB — PROTIME-INR
INR: 1.1 (ref 0.8–1.2)
Prothrombin Time: 13.7 seconds (ref 11.4–15.2)

## 2021-07-09 LAB — BASIC METABOLIC PANEL
Anion gap: 6 (ref 5–15)
BUN: 12 mg/dL (ref 8–23)
CO2: 28 mmol/L (ref 22–32)
Calcium: 9.1 mg/dL (ref 8.9–10.3)
Chloride: 101 mmol/L (ref 98–111)
Creatinine, Ser: 0.75 mg/dL (ref 0.44–1.00)
GFR, Estimated: >60 mL/min (ref >60)
Glucose, Bld: 121 mg/dL — ABNORMAL HIGH (ref 70–99)
Potassium: 4 mmol/L (ref 3.5–5.1)
Sodium: 135 mmol/L (ref 135–145)

## 2021-07-09 LAB — LIPID PANEL
Cholesterol: 155 mg/dL (ref 0–200)
HDL: 63 mg/dL (ref >40)
LDL Cholesterol: 72 mg/dL (ref 0–99)
Total CHOL/HDL Ratio: 2.5 RATIO
Triglycerides: 101 mg/dL (ref <150)
VLDL: 20 mg/dL (ref 0–40)

## 2021-07-09 LAB — CBC
HCT: 36.9 % (ref 36.0–46.0)
Hemoglobin: 12.8 g/dL (ref 12.0–15.0)
MCH: 32 pg (ref 26.0–34.0)
MCHC: 34.7 g/dL (ref 30.0–36.0)
MCV: 92.3 fL (ref 80.0–100.0)
Platelets: 244 10*3/uL (ref 150–400)
RBC: 4 MIL/uL (ref 3.87–5.11)
RDW: 13 % (ref 11.5–15.5)
WBC: 8.7 10*3/uL (ref 4.0–10.5)
nRBC: 0 % (ref 0.0–0.2)

## 2021-07-09 LAB — HEPARIN LEVEL (UNFRACTIONATED): Heparin Unfractionated: 0.35 IU/mL (ref 0.30–0.70)

## 2021-07-09 LAB — TROPONIN I (HIGH SENSITIVITY)
Troponin I (High Sensitivity): 323 ng/L (ref <18)
Troponin I (High Sensitivity): 2014 ng/L (ref <18)
Troponin I (High Sensitivity): 4423 ng/L (ref <18)
Troponin I (High Sensitivity): 4091 ng/L (ref <18)

## 2021-07-09 LAB — T4, FREE: Free T4: 0.89 ng/dL (ref 0.61–1.12)

## 2021-07-09 LAB — HEMOGLOBIN A1C
Hgb A1c MFr Bld: 5.6 % (ref 4.8–5.6)
Mean Plasma Glucose: 114.02 mg/dL

## 2021-07-09 LAB — APTT: aPTT: 24 seconds (ref 24–36)

## 2021-07-09 LAB — RESP PANEL BY RT-PCR (FLU A&B, COVID) ARPGX2
Influenza A by PCR: NEGATIVE
Influenza B by PCR: NEGATIVE
SARS Coronavirus 2 by RT PCR: NEGATIVE

## 2021-07-09 LAB — TSH: TSH: 5.032 u[IU]/mL — ABNORMAL HIGH (ref 0.350–4.500)

## 2021-07-09 MED ORDER — HEPARIN (PORCINE) 25000 UT/250ML-% IV SOLN
1000.0000 [IU]/h | INTRAVENOUS | Status: DC
  Administered 2021-07-09: 800 [IU]/h via INTRAVENOUS
  Filled 2021-07-09: qty 250

## 2021-07-09 MED ORDER — CALCIUM CARBONATE 1250 (500 CA) MG PO TABS
500.0000 mg | ORAL_TABLET | Freq: Two times a day (BID) | ORAL | Status: DC
  Filled 2021-07-09 (×3): qty 1

## 2021-07-09 MED ORDER — HYDROCODONE-ACETAMINOPHEN 5-325 MG PO TABS: 1.0000 | ORAL_TABLET | ORAL | Status: DC | PRN

## 2021-07-09 MED ORDER — ONDANSETRON HCL 4 MG PO TABS: 4.0000 mg | ORAL_TABLET | Freq: Four times a day (QID) | ORAL | Status: DC | PRN

## 2021-07-09 MED ORDER — MORPHINE SULFATE (PF) 2 MG/ML IV SOLN: 2.0000 mg | INTRAVENOUS | Status: DC | PRN

## 2021-07-09 MED ORDER — IOHEXOL 350 MG/ML SOLN
75.0000 mL | Freq: Once | INTRAVENOUS | Status: AC | PRN
  Administered 2021-07-09: 75 mL via INTRAVENOUS

## 2021-07-09 MED ORDER — CARVEDILOL 3.125 MG PO TABS
3.1250 mg | ORAL_TABLET | Freq: Two times a day (BID) | ORAL | Status: DC
  Administered 2021-07-09 – 2021-07-10 (×2): 3.125 mg via ORAL
  Filled 2021-07-09 (×2): qty 1

## 2021-07-09 MED ORDER — ACETAMINOPHEN 325 MG PO TABS
650.0000 mg | ORAL_TABLET | Freq: Four times a day (QID) | ORAL | Status: DC | PRN
  Administered 2021-07-09 – 2021-07-10 (×2): 650 mg via ORAL
  Filled 2021-07-09 (×2): qty 2

## 2021-07-09 MED ORDER — PANTOPRAZOLE SODIUM 40 MG PO TBEC
40.0000 mg | DELAYED_RELEASE_TABLET | Freq: Every day | ORAL | Status: DC
  Administered 2021-07-10: 40 mg via ORAL
  Filled 2021-07-09: qty 1

## 2021-07-09 MED ORDER — ASPIRIN 81 MG PO CHEW
81.0000 mg | CHEWABLE_TABLET | Freq: Every day | ORAL | Status: DC
  Administered 2021-07-10: 81 mg via ORAL
  Filled 2021-07-09: qty 1

## 2021-07-09 MED ORDER — ACETAMINOPHEN 650 MG RE SUPP: 650.0000 mg | Freq: Four times a day (QID) | RECTAL | Status: DC | PRN

## 2021-07-09 MED ORDER — LORATADINE 10 MG PO TABS
10.0000 mg | ORAL_TABLET | Freq: Every day | ORAL | Status: DC
  Administered 2021-07-10: 10 mg via ORAL
  Filled 2021-07-09: qty 1

## 2021-07-09 MED ORDER — FOLIC ACID 1 MG PO TABS
1.0000 mg | ORAL_TABLET | Freq: Every day | ORAL | Status: DC
  Administered 2021-07-10: 1 mg via ORAL
  Filled 2021-07-09: qty 1

## 2021-07-09 MED ORDER — ASPIRIN 81 MG PO CHEW
324.0000 mg | CHEWABLE_TABLET | Freq: Once | ORAL | Status: AC
  Administered 2021-07-09: 324 mg via ORAL
  Filled 2021-07-09: qty 4

## 2021-07-09 MED ORDER — ESTRADIOL 1 MG PO TABS
1.0000 mg | ORAL_TABLET | Freq: Every day | ORAL | Status: DC
  Filled 2021-07-09: qty 1

## 2021-07-09 MED ORDER — OMEGA-3 EPA FISH OIL 1205 MG PO CAPS: 340.0000 mg | ORAL_CAPSULE | Freq: Every day | ORAL | Status: DC

## 2021-07-09 MED ORDER — METHOTREXATE 2.5 MG PO TABS
15.0000 mg | ORAL_TABLET | ORAL | Status: DC
Start: 2021-07-10 — End: 2021-07-10
  Filled 2021-07-09: qty 6

## 2021-07-09 MED ORDER — HEPARIN BOLUS VIA INFUSION
4000.0000 [IU] | Freq: Once | INTRAVENOUS | Status: AC
  Administered 2021-07-09: 4000 [IU] via INTRAVENOUS
  Filled 2021-07-09: qty 4000

## 2021-07-09 MED ORDER — ASCORBIC ACID 500 MG PO TABS
500.0000 mg | ORAL_TABLET | Freq: Every day | ORAL | Status: DC
  Administered 2021-07-10: 500 mg via ORAL
  Filled 2021-07-09: qty 1

## 2021-07-09 MED ORDER — ADULT MULTIVITAMIN W/MINERALS CH
1.0000 | ORAL_TABLET | Freq: Every day | ORAL | Status: DC
  Administered 2021-07-10: 1 via ORAL
  Filled 2021-07-09: qty 1

## 2021-07-09 MED ORDER — ONDANSETRON HCL 4 MG/2ML IJ SOLN
4.0000 mg | Freq: Four times a day (QID) | INTRAMUSCULAR | Status: DC | PRN
  Administered 2021-07-09: 4 mg via INTRAVENOUS
  Filled 2021-07-09: qty 2

## 2021-07-09 MED ORDER — POLYETHYLENE GLYCOL 3350 17 G PO PACK: 17.0000 g | PACK | Freq: Every day | ORAL | Status: DC | PRN

## 2021-07-09 MED ORDER — METHOTREXATE 2.5 MG PO TABS
15.0000 mg | ORAL_TABLET | ORAL | Status: DC
Start: 2021-07-10 — End: 2021-07-09

## 2021-07-09 MED ORDER — LOSARTAN POTASSIUM 50 MG PO TABS: 50.0000 mg | ORAL_TABLET | Freq: Every day | ORAL | Status: DC

## 2021-07-09 MED ORDER — DEXTROSE IN LACTATED RINGERS 5 % IV SOLN: INTRAVENOUS | Status: DC

## 2021-07-09 NOTE — ED Provider Notes (Signed)
[facility] Emergency Department Provider Note  Time seen: 1:02 PM  I have reviewed the triage vital signs and the nursing notes.   HISTORY  Chief Complaint Near Syncope   HPI Barbara Tucker is a 75 y.o. female with a past medical history of hypertension, hypothyroidism, rheumatoid arthritis, presents to the emergency department after near syncopal event.  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  Patient does admit she was under a lot of stress regarding this lecture, while talking she began feeling somewhat short of breath sweaty and nauseated.  Patient states she tried to sit down felt like she was feeling somewhat better but then became nauseated and vomited several times.  Patient states she has passed out previously but has been many years per patient.  Patient denies any weakness or numbness at any point.  Denies any chest pain at any point.  Denies any recent illnesses fever cough congestion vomiting or diarrhea.  Patient states she is feeling back to normal currently.  Per EMS patient had blood pressure in the 80s over 40s upon their arrival received 500 cc of normal saline.  Currently blood pressure is 128/88.   Past Medical History:  Diagnosis Date   Allergic rhinitis    Allergy    Endometriosis    Heart murmur 1975   minimal, seen on ultrasound long ago   Hypertension    Hypothyroidism    IBS (irritable bowel syndrome)    Migraine    Osteoarthritis    Rheumatoid arthritis [facility])     Patient Active Problem List   Diagnosis Date Noted   Osteopenia 05/25/2018   Hypertension    Subclinical hypothyroidism    Rheumatoid arthritis ([place])     Past Surgical History:  Procedure Laterality Date   ABDOMINAL HYSTERECTOMY     APPENDECTOMY     BREAST EXCISIONAL BIOPSY Right 1975   excisional - negative   BREAST SURGERY  1975?   benign cyst removed   COLONOSCOPY WITH PROPOFOL N/A 02/03/2016   Procedure: COLONOSCOPY WITH  PROPOFOL;  Surgeon: Lollie Sails, MD;  Location: [facility] ENDOSCOPY;  Service: Endoscopy;  Laterality: N/A;   OOPHORECTOMY Bilateral    TONSILLECTOMY     TUBAL LIGATION      Prior to Admission medications   Medication Sig Start Date End Date Taking? Authorizing Provider  ascorbic acid (VITAMIN C) 500 MG tablet Take 500 mg by mouth daily.    [provider]  calcium carbonate (OSCAL) 1500 (600 Ca) MG TABS tablet Take 600 mg of elemental calcium by mouth 2 (two) times daily with a meal.    [provider]  Calcium Polycarbophil (FIBER-CAPS PO) Take by mouth.    [provider]  COVID-19 mRNA Vac-TriS, Pfizer, (PFIZER-BIONT COVID-19 VAC-TRIS) SUSP injection Inject into the muscle. 03/13/21   Carlyle Basques, MD  dicyclomine (BENTYL) 10 MG capsule Take 10 mg by mouth 4 (four) times daily.    [provider]  estradiol (ESTRACE) 1 MG tablet Take 1 tablet (1 mg total) by mouth daily. 02/03/21   Johnson, Megan P, DO  etodolac (LODINE) 400 MG tablet Take 400 mg by mouth 2 (two) times daily.    [provider]  fexofenadine (ALLEGRA) 180 MG tablet Take 180 mg by mouth daily.    [provider]  folic acid (FOLVITE) 1 MG tablet Take 1 mg by mouth daily.    [provider]  guaiFENesin (MUCINEX) 600 MG 12  hr tablet Take by mouth 2 (two) times daily.    [provider]  hydroxychloroquine (PLAQUENIL) 200 MG tablet Take 200 mg by mouth daily.     [provider]  losartan (COZAAR) 50 MG tablet Take 1 tablet (50 mg total) by mouth daily. 02/03/21   Johnson, Megan P, DO  methotrexate (RHEUMATREX) 2.5 MG tablet Take 15 mg by mouth once a week.  07/18/19   [provider]  metoprolol succinate (TOPROL-XL) 25 MG 24 hr tablet TAKE 1 TABLET BY MOUTH DAILY 06/24/21   Park Liter P, DO  Multiple Vitamin (MULTIVITAMIN) tablet Take 1 tablet by mouth daily.    [provider]  Omega-3 Fatty Acids (OMEGA-3 EPA FISH OIL PO)  Take 340-1,000 mg by mouth daily.    [provider]  omeprazole (PRILOSEC) 20 MG capsule Take 20 mg by mouth daily.    [provider]    Allergies  Allergen Reactions   Codeine    Lisinopril Other (See Comments)    Migraine   Sulfa Antibiotics     Family History  Problem Relation Age of Onset   Breast cancer Paternal Grandmother    Cancer Paternal Grandmother        Breast   Heart disease Paternal Grandmother    Stroke Mother    Arthritis Mother        Osteo and rheumatoid   Hypertension Mother    Angina Mother    Vision loss Mother    Cancer Father        Multiple Myeloma   Gout Father    Cancer Sister        Breast   Heart disease Sister    Cancer Sister        Skin   Cancer Brother        prostate    Social History Social History   Tobacco Use   Smoking status: Never   Smokeless tobacco: Never   Tobacco comments:    never smoked, around lots of second hand  Vaping Use   Vaping Use: Never used  Substance Use Topics   Alcohol use: Not Currently   Drug use: No    Review of Systems Constitutional: Negative for fever.  Lightheadedness, now resolved Cardiovascular: Negative for chest pain. Respiratory: Negative for shortness of breath. Gastrointestinal: Negative for abdominal pain.  Nausea vomiting now resolved Genitourinary: Negative for urinary compaints Musculoskeletal: Negative for musculoskeletal complaints Neurological: Negative for headache All other ROS negative  ____________________________________________   PHYSICAL EXAM:  VITAL SIGNS: ED Triage Vitals  Enc Vitals Group     BP 07/09/21 1216 128/88     Pulse Rate 07/09/21 1216 69     Resp 07/09/21 1216 16     Temp 07/09/21 1216 97.8 F (36.6 C)     Temp Source 07/09/21 1216 Oral     SpO2 07/09/21 1216 98 %     Weight 07/09/21 1218 144 lb 2.9 oz (65.4 kg)     Height 07/09/21 1218 '5\' 7"'  (1.702 m)     Head Circumference --      Peak Flow --      Pain Score 07/09/21  1218 0     Pain Loc --      Pain Edu? --      Excl. in [place]? --    Constitutional: Alert and oriented. Well appearing and in no distress. Eyes: Normal exam ENT      Head: Normocephalic and atraumatic.  Mouth/Throat: Mucous membranes are moist. Cardiovascular: Normal rate, regular rhythm.  Respiratory: Normal respiratory effort without tachypnea nor retractions. Breath sounds are clear  Gastrointestinal: Soft and nontender. No distention.   Musculoskeletal: Nontender with normal range of motion in all extremities.  Neurologic:  Normal speech and language. No gross focal neurologic deficits  Skin:  Skin is warm, dry and intact.  Psychiatric: Mood and affect are normal.   ____________________________________________    EKG  Reviewed and interpreted by myself shows a normal sinus rhythm at 61 bpm with a narrow QRS, normal axis, normal intervals, no concerning ST changes.  ____________________________________________   INITIAL IMPRESSION / ASSESSMENT AND PLAN / ED COURSE  Pertinent labs & imaging results that were available during my care of the patient were reviewed by me and considered in my medical decision making (see chart for details).   Patient presents emergency department after near syncopal episode while giving a lecture.  Patient states she is under tremendous amount of stress all given the lecture.  Symptoms are suggestive of possible vasovagal event.  EMS states initially patient had a low blood pressure but after 500 cc of fluid patient's blood pressure is normotensive.  We will continue to closely monitor on cardiac monitoring.  Basic labs are largely within normal limits EKG is reassuring.  I have added on a cardiac enzyme and we will repeat a cardiac enzyme 2 hours after initial blood work was drawn.  If patient's work-up is negative anticipate likely discharge home.  Patient agreeable to plan of care.  Denies any symptoms currently besides feeling hungry.  Labs have  resulted showing elevated troponin.  Repeat troponin is pending.  Given the patient's near syncopal event with elevated troponin we will obtain a CTA of the chest to rule out PE.  We will start on IV heparin given the likelihood of a cardiac event/NSTEMI.  Patient required mission to the hospital service for further work-up and treatment.  Barbara Tucker was evaluated in Emergency Department on 07/09/2021 for the symptoms described in the history of present illness. She was evaluated in the context of the global COVID-19 pandemic, which necessitated consideration that the patient might be at risk for infection with the SARS-CoV-2 virus that causes COVID-19. Institutional protocols and algorithms that pertain to the evaluation of patients at risk for COVID-19 are in a state of rapid change based on information released by regulatory bodies including the CDC and federal and state organizations. These policies and algorithms were followed during the patient's care in the ED.  CRITICAL CARE Performed by: Harvest Dark   Total critical care time: 30 minutes  Critical care time was exclusive of separately billable procedures and treating other patients.  Critical care was necessary to treat or prevent imminent or life-threatening deterioration.  Critical care was time spent personally by me on the following activities: development of treatment plan with patient and/or surrogate as well as nursing, discussions with consultants, evaluation of patient's response to treatment, examination of patient, obtaining history from patient or surrogate, ordering and performing treatments and interventions, ordering and review of laboratory studies, ordering and review of radiographic studies, pulse oximetry and re-evaluation of patient's condition.  ____________________________________________   FINAL CLINICAL IMPRESSION(S) / ED DIAGNOSES  Near syncopal event NSTEMI   Harvest Dark, MD 07/09/21  1425

## 2021-07-09 NOTE — H&P (Addendum)
History and Physical    Barbara Tucker XAJ:[id number] DOB: [birth year] DOA: 07/09/2021  PCP: Valerie Roys, DO  Chief Complaint: Near syncope  HPI: Barbara Tucker is a 75 y.o. female with a past medical history of hypertension, GERD, rheumatoid arthritis on chronic immunosuppressive therapy.  This patient presents to the emergency department due to a near syncopal episode while at work today.  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  She stated it was stressful.  Shortly thereafter she suddenly felt short of breath and nauseous. She vomited as well.  She was also diaphoretic.  She almost passed out but did not.  Upon EMS arrival blood pressure was 80/40.  Given a 500 cc bolus and blood pressure improved to 128/88.  Still remains normotensive upon my exam.  She has not endorsed any chest pain.  No known history of coronary artery disease.  No previous cardiac stress test or cardiac catheterization.  She does not follow with a cardiologist.  Denies any family history of early myocardial infarction.  She denies any recent prolonged travel.  No recent surgery.  No current cancer treatment.  She denies any calf pain. She denies any recent illness.  She reports that she saw her primary care physician for neck pain and underwent imaging showing osteoarthritis.  Was sent for physical therapy which helped.  She also states that when she flexes her neck towards her chest she feels similar symptomatology that she felt today.  She states this has been going on for a while.  ED Course: EKG showed normal sinus rhythm with no acute ischemic changes. Initial troponin elevated at 323.  Repeat troponin around 2000.  Given heparin bolus and started on heparin drip.  CT PE study has been ordered and pending.  Other labs are fairly unremarkable.  Review of Systems: 14 point review of systems is negative except for what is mentioned above in the HPI.   Past Medical History:  Diagnosis Date   Allergic  rhinitis    Allergy    Endometriosis    Heart murmur 1975   minimal, seen on ultrasound long ago   Hypertension    Hypothyroidism    IBS (irritable bowel syndrome)    Migraine    Osteoarthritis    Rheumatoid arthritis ([place])     Past Surgical History:  Procedure Laterality Date   ABDOMINAL HYSTERECTOMY     APPENDECTOMY     BREAST EXCISIONAL BIOPSY Right 1975   excisional - negative   BREAST SURGERY  1975?   benign cyst removed   COLONOSCOPY WITH PROPOFOL N/A 02/03/2016   Procedure: COLONOSCOPY WITH PROPOFOL;  Surgeon: Lollie Sails, MD;  Location: [facility] ENDOSCOPY;  Service: Endoscopy;  Laterality: N/A;   OOPHORECTOMY Bilateral    TONSILLECTOMY     TUBAL LIGATION      Social History   Socioeconomic History   Marital status: Married    Spouse name: Not on file   Number of children: Not on file   Years of education: Not on file   Highest education level: Not on file  Occupational History   Occupation: retired  Tobacco Use   Smoking status: Never   Smokeless tobacco: Never   Tobacco comments:    never smoked, around lots of second hand  Vaping Use   Vaping Use: Never used  Substance and Sexual Activity   Alcohol use: Not Currently   Drug use: No   Sexual activity: Not Currently  Other Topics Concern  Not on file  Social History Narrative   Not on file   Social Determinants of Health   Financial Resource Strain: Low Risk    Difficulty of Paying Living Expenses: Not hard at all  Food Insecurity: No Food Insecurity   Worried About Charity fundraiser in the Last Year: Never true   Arboriculturist in the Last Year: Never true  Transportation Needs: No Transportation Needs   Lack of Transportation (Medical): No   Lack of Transportation (Non-Medical): No  Physical Activity: Sufficiently Active   Days of Exercise per Week: 5 days   Minutes of Exercise per Session: 30 min  Stress: No Stress Concern Present   Feeling of Stress : Not at all  Social  Connections: Not on file  Intimate Partner Violence: Not on file    Allergies  Allergen Reactions   Codeine    Lisinopril Other (See Comments)    Migraine   Sulfa Antibiotics     Family History  Problem Relation Age of Onset   Breast cancer Paternal Grandmother    Cancer Paternal Grandmother        Breast   Heart disease Paternal Grandmother    Stroke Mother    Arthritis Mother        Osteo and rheumatoid   Hypertension Mother    Angina Mother    Vision loss Mother    Cancer Father        Multiple Myeloma   Gout Father    Cancer Sister        Breast   Heart disease Sister    Cancer Sister        Skin   Cancer Brother        prostate    Prior to Admission medications   Medication Sig Start Date End Date Taking? Authorizing Provider  ascorbic acid (VITAMIN C) 500 MG tablet Take 500 mg by mouth daily.    [provider]  calcium carbonate (OSCAL) 1500 (600 Ca) MG TABS tablet Take 600 mg of elemental calcium by mouth 2 (two) times daily with a meal.    [provider]  Calcium Polycarbophil (FIBER-CAPS PO) Take by mouth.    [provider]  COVID-19 mRNA Vac-TriS, Pfizer, (PFIZER-BIONT COVID-19 VAC-TRIS) SUSP injection Inject into the muscle. 03/13/21   Carlyle Basques, MD  dicyclomine (BENTYL) 10 MG capsule Take 10 mg by mouth 4 (four) times daily.    [provider]  estradiol (ESTRACE) 1 MG tablet Take 1 tablet (1 mg total) by mouth daily. 02/03/21   Johnson, Megan P, DO  etodolac (LODINE) 400 MG tablet Take 400 mg by mouth 2 (two) times daily.    [provider]  fexofenadine (ALLEGRA) 180 MG tablet Take 180 mg by mouth daily.    [provider]  folic acid (FOLVITE) 1 MG tablet Take 1 mg by mouth daily.    [provider]  guaiFENesin (MUCINEX) 600 MG 12 hr tablet Take by mouth 2 (two) times daily.    [provider]  hydroxychloroquine (PLAQUENIL) 200 MG tablet Take 200 mg by mouth daily.      [provider]  losartan (COZAAR) 50 MG tablet Take 1 tablet (50 mg total) by mouth daily. 02/03/21   Johnson, Megan P, DO  methotrexate (RHEUMATREX) 2.5 MG tablet Take 15 mg by mouth once a week.  07/18/19   [provider]  metoprolol succinate (TOPROL-XL) 25 MG 24 hr tablet TAKE 1 TABLET  BY MOUTH DAILY 06/24/21   Park Liter P, DO  Multiple Vitamin (MULTIVITAMIN) tablet Take 1 tablet by mouth daily.    [provider]  Omega-3 Fatty Acids (OMEGA-3 EPA FISH OIL PO) Take 340-1,000 mg by mouth daily.    [provider]  omeprazole (PRILOSEC) 20 MG capsule Take 20 mg by mouth daily.    [provider]    Physical Exam: Vitals:   07/09/21 1218 07/09/21 1245 07/09/21 1300 07/09/21 1330  BP:  140/69 138/73 122/80  Pulse:  63 73 69  Resp:    18  Temp:      TempSrc:      SpO2:  97% 97% 99%  Weight: 65.4 kg     Height: '5\' 7"'  (1.702 m)        General:  Appears calm and comfortable and is in NAD Cardiovascular:  RRR, no m/r/g.  Respiratory:   CTA bilaterally with no wheezes/rales/rhonchi.  Normal respiratory effort. Abdomen:  soft, NT, ND, NABS Skin:  no rash or induration seen on limited exam Musculoskeletal:  grossly normal tone BUE/BLE, good ROM, no bony abnormality Lower extremity:  No LE edema.  Limited foot exam with no ulcerations.  2+ distal pulses. Psychiatric:  grossly normal mood and affect, speech fluent and appropriate, AOx3 Neurologic:  CN 2-12 grossly intact, moves all extremities in coordinated fashion, sensation intact    Radiological Exams on Admission: Independently reviewed - see discussion in A/P where applicable  No results found.  EKG: Independently reviewed.  NSR with rate 61   Labs on Admission: I have personally reviewed the available labs and imaging studies at the time of the admission.  Pertinent labs: Troponin 323, repeat troponin 2,014, blood glucose 121     Assessment/Plan: Near syncope: Initially  thought to be secondary to vasovagal.  With troponins continuing to rise concern for pulmonary embolism with right heart strain versus NSTEMI is certainly now on the differential.  Given a heparin bolus and started on heparin drip in the emergency department.  Echocardiogram will be ordered.  Cardiology consulted and made aware.  EKG shows no acute ischemic changes.  Obtain A1c, TSH, lipid panel.  Start aspirin 81 mg daily.  Patient will remain on telemetry.  Ordered carotid duplex ultrasound.  Obtain orthostatic vital signs daily.CT PE study pending.  Hypertension: Continue home Cozaar. Cardiology has started the patient on Coreg. Will discontinue patients home Toprol-XL.  Rheumatoid arthritis: Hold Humira. Continue home methotrexate and folic acid  GERD: Continue home Prilosec   Level of Care: Progressive cardiac DVT prophylaxis: Heparin drip Code Status: Full code Consults: Cardiology Admission status: Inpatient   Leslee Home DO [facility]   How to contact the [facility] Attending or Consulting provider [place] or covering provider during after hours [place], for this patient?  Check the care team in [facility] and look for a) attending/consulting [facility] provider listed and b) the [facility] team listed Log into [email or web address] and use [place]'s universal password to access. If you do not have the password, please contact the hospital operator. Locate the [facility] provider you are looking for under [facility] and page to a number that you can be directly reached. If you still have difficulty reaching the provider, please page the [facility] (Director on Call) for the Hospitalists listed on amion for assistance.   07/09/2021, 2:42 PM

## 2021-07-09 NOTE — ED Notes (Signed)
Pt taken for CT 

## 2021-07-09 NOTE — H&P (View-Only) (Signed)
Cardiology Consultation:   Patient ID: Barbara Tucker MRN: [id number]; DOB: 1946/02/11  Admit date: 07/09/2021 Date of Consult: 07/09/2021  PCP:  Valerie Roys, DO   [place]  Cardiologist:  [facility], Dr. Rockey Situ Advanced Practice Provider:  No care team member to display Electrophysiologist:  None   Patient Profile:   Barbara Tucker is a 75 y.o. female with a hx of hypertension, hypothyroidism, rheumatoid arthritis, and who is being seen today for the evaluation of elevated troponin and near syncope at the request of Dr. Rowe Pavy.  History of Present Illness:   Barbara Tucker is a 75 year old female with PMH as above.  She has no previously known history of CAD or arrhythmia.  She has no known family history of early cardiac death.  Family history does include hypertension, stroke, and heart failure as below.  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  She does report a lot of stress surrounding giving the lecture itself.  She had not slept well the night before.  She does report that she was eating and drinking well.  During the lecture, she started to feel "not like herself."  She describes this feeling as difficult to describe but not quite weakness or dizziness.  She was seated at the time that this feeling began.  She then felt diaphoretic and nauseated.  She leaned forward on a table and noted the symptoms started to dissipate after a while.  However, they then returned, and she experienced several episodes of emesis.  She denies any chest pain or shortness of breath.  No syncope/ LOC.   Per EMS, BP 80s over 40s.  She received 500 cc of IVF.    She was taken to [facility] ED.  BP on arrival 128/88 with HR 69 bpm.  Labs showed stable renal function and potassium at goal.  COVID-19/flu negative.  HS Tn 323  2014.0.  EKG NSR, 61 bpm, poor R wave progression in III, V1-V3, nonspecific ST changes in V2 with  recommendation as below to trend and with repeat EKG pending.  H&H stable.  TSH elevated at 5.032.  LDL 72.  CT without evidence of pulmonary embolism and showed mild global cardiomegaly with aortic atherosclerosis.  Mild bibasilar subsegmental atelectasis also noted.  Carotids pending.   Past Medical History:  Diagnosis Date   Allergic rhinitis    Allergy    Endometriosis    Heart murmur 1975   minimal, seen on ultrasound long ago   Hypertension    Hypothyroidism    IBS (irritable bowel syndrome)    Migraine    Osteoarthritis    Rheumatoid arthritis ([place])     Past Surgical History:  Procedure Laterality Date   ABDOMINAL HYSTERECTOMY     APPENDECTOMY     BREAST EXCISIONAL BIOPSY Right 1975   excisional - negative   BREAST SURGERY  1975?   benign cyst removed   COLONOSCOPY WITH PROPOFOL N/A 02/03/2016   Procedure: COLONOSCOPY WITH PROPOFOL;  Surgeon: Lollie Sails, MD;  Location: [facility] ENDOSCOPY;  Service: Endoscopy;  Laterality: N/A;   OOPHORECTOMY Bilateral    TONSILLECTOMY     TUBAL LIGATION       Home Medications:  Prior to Admission medications   Medication Sig Start Date End Date Taking? Authorizing Provider  ascorbic acid (VITAMIN C) 500 MG tablet Take 500 mg by mouth daily.    [provider]  calcium carbonate (  OSCAL) 1500 (600 Ca) MG TABS tablet Take 600 mg of elemental calcium by mouth 2 (two) times daily with a meal.    [provider]  Calcium Polycarbophil (FIBER-CAPS PO) Take by mouth.    [provider]  COVID-19 mRNA Vac-TriS, Pfizer, (PFIZER-BIONT COVID-19 VAC-TRIS) SUSP injection Inject into the muscle. 03/13/21   Carlyle Basques, MD  dicyclomine (BENTYL) 10 MG capsule Take 10 mg by mouth 4 (four) times daily.    [provider]  estradiol (ESTRACE) 1 MG tablet Take 1 tablet (1 mg total) by mouth daily. 02/03/21   Johnson, Megan P, DO  etodolac (LODINE) 400 MG tablet Take 400 mg by mouth 2 (two) times daily.    [provider]  fexofenadine (ALLEGRA) 180 MG tablet Take 180 mg by mouth daily.    [provider]  folic acid (FOLVITE) 1 MG tablet Take 1 mg by mouth daily.    [provider]  guaiFENesin (MUCINEX) 600 MG 12 hr tablet Take by mouth 2 (two) times daily.    [provider]  hydroxychloroquine (PLAQUENIL) 200 MG tablet Take 200 mg by mouth daily.     [provider]  losartan (COZAAR) 50 MG tablet Take 1 tablet (50 mg total) by mouth daily. 02/03/21   Johnson, Megan P, DO  methotrexate (RHEUMATREX) 2.5 MG tablet Take 15 mg by mouth once a week.  07/18/19   [provider]  metoprolol succinate (TOPROL-XL) 25 MG 24 hr tablet TAKE 1 TABLET BY MOUTH DAILY 06/24/21   Park Liter P, DO  Multiple Vitamin (MULTIVITAMIN) tablet Take 1 tablet by mouth daily.    [provider]  Omega-3 Fatty Acids (OMEGA-3 EPA FISH OIL PO) Take 340-1,000 mg by mouth daily.    [provider]  omeprazole (PRILOSEC) 20 MG capsule Take 20 mg by mouth daily.    [provider]    Inpatient Medications: Scheduled Meds:  [START ON 07/10/2021] aspirin  81 mg Oral Daily   heparin  4,000 Units Intravenous Once   Continuous Infusions:  heparin     PRN Meds: acetaminophen **OR** acetaminophen, HYDROcodone-acetaminophen, morphine injection, ondansetron **OR** ondansetron (ZOFRAN) IV, polyethylene glycol  Allergies:    Allergies  Allergen Reactions   Codeine    Lisinopril Other (See Comments)    Migraine   Sulfa Antibiotics     Social History:   Social History   Socioeconomic History   Marital status: Married    Spouse name: Not on file   Number of children: Not on file   Years of education: Not on file   Highest education level: Not on file  Occupational History   Occupation: retired  Tobacco Use   Smoking status: Never   Smokeless tobacco: Never   Tobacco comments:    never smoked, around lots of second hand  Vaping Use   Vaping Use:  Never used  Substance and Sexual Activity   Alcohol use: Not Currently   Drug use: No   Sexual activity: Not Currently  Other Topics Concern   Not on file  Social History Narrative   Not on file   Social Determinants of Health   Financial Resource Strain: Low Risk    Difficulty of Paying Living Expenses: Not hard at all  Food Insecurity: No Food Insecurity   Worried About Charity fundraiser in the Last Year: Never true   [place] in the Last Year: Never true  Transportation Needs: No Transportation Needs  Lack of Transportation (Medical): No   Lack of Transportation (Non-Medical): No  Physical Activity: Sufficiently Active   Days of Exercise per Week: 5 days   Minutes of Exercise per Session: 30 min  Stress: No Stress Concern Present   Feeling of Stress : Not at all  Social Connections: Not on file  Intimate Partner Violence: Not on file    Family History:    Family History  Problem Relation Age of Onset   Breast cancer Paternal Grandmother    Cancer Paternal Grandmother        Breast   Heart disease Paternal Grandmother    Stroke Mother    Arthritis Mother        Osteo and rheumatoid   Hypertension Mother    Angina Mother    Vision loss Mother    Cancer Father        Multiple Myeloma   Gout Father    Cancer Sister        Breast   Heart disease Sister    Cancer Sister        Skin   Cancer Brother        prostate     ROS:  Please see the history of present illness.  Review of Systems  Constitutional:  Positive for diaphoresis.  Respiratory:  Negative for shortness of breath.   Cardiovascular:  Negative for chest pain and palpitations.  Gastrointestinal:  Positive for nausea and vomiting.  Musculoskeletal:  Negative for falls.  Neurological:  Negative for loss of consciousness.       Weakness rather than dizziness  Psychiatric/Behavioral:         Anxiety   All other ROS reviewed and negative.     Physical Exam/Data:   Vitals:   07/09/21  1218 07/09/21 1245 07/09/21 1300 07/09/21 1330  BP:  140/69 138/73 122/80  Pulse:  63 73 69  Resp:    18  Temp:      TempSrc:      SpO2:  97% 97% 99%  Weight: 65.4 kg     Height: 5' 7" (1.702 m)      No intake or output data in the 24 hours ending 07/09/21 1532 Last 3 Weights 07/09/2021 02/03/2021 08/15/2020  Weight (lbs) 144 lb 2.9 oz 144 lb 3.2 oz 145 lb  Weight (kg) 65.4 kg 65.409 kg 65.772 kg     Body mass index is 22.58 kg/m.  General:  Well nourished, well developed, in no acute distress.  Joined by her husband. HEENT: normal Lymph: no adenopathy Neck: no JVD Endocrine:  No thryomegaly Vascular: No carotid bruits; FA pulses 2+ bilaterally without bruits  Cardiac:  normal S1, S2; RRR; no murmur  Lungs: Bibasilar dry crackles. No wheezing, rhonchi or rales  Abd: soft, nontender, no hepatomegaly  Ext: no edema Musculoskeletal:  No deformities, BUE and BLE strength normal and equal Skin: warm and dry  Neuro:  CNs 2-12 intact, no focal abnormalities noted Psych:  Normal affect   EKG:  The EKG was personally reviewed and demonstrates:  NSR, 61 bpm, poor R wave progression in III, V1-V3, nonspecific ST changes V2 with repeat EKG pending.  Telemetry:  Telemetry was personally reviewed and demonstrates: Telemetry leads not connected given patient just returned from recent CT scan  Relevant CV Studies: Echo pending  Laboratory Data:  High Sensitivity Troponin:   Recent Labs  Lab 07/09/21 1217 07/09/21 1403  TROPONINIHS 323* 2,014*     Chemistry Recent Labs  Lab   07/09/21 1217  NA 135  K 4.0  CL 101  CO2 28  GLUCOSE 121*  BUN 12  CREATININE 0.75  CALCIUM 9.1  GFRNONAA >60  ANIONGAP 6    No results for input(s): PROT, ALBUMIN, AST, ALT, ALKPHOS, BILITOT in the last 168 hours. Hematology Recent Labs  Lab 07/09/21 1217  WBC 8.7  RBC 4.00  HGB 12.8  HCT 36.9  MCV 92.3  MCH 32.0  MCHC 34.7  RDW 13.0  PLT 244   BNPNo results for input(s): BNP, PROBNP in  the last 168 hours.  DDimer No results for input(s): DDIMER in the last 168 hours.   Radiology/Studies:  No results found.   Assessment and Plan:   NSTEMI -- No chest pain at any time leading up to admission.  Reports feeling a weakness while seated and associated with diaphoresis and nausea/emesis.  She does admit to stress surrounding giving her a lecture.  CT without evidence of pulmonary embolism and showing cardiomegaly.  HS Tn 323.0  2014.0 and still cycling.  EKG with nonspecific changes as above and pending repeat EKG.  Echo ordered, pending.  Carotids pending given weakness reported. Given her symptoms as detailed in HPI above and elevated troponin, as well as cardiomegaly on her imaging, further ischemic work-up recommended to rule out coronary insufficiency. Continue IV heparin. HS Tn ordered to continue to trend and until peaked, downtrending. Repeat EKG ordered - trend with serial EKGs. Plan for LHC with Dr. Arida on 07/10/21.  Shared Decision Making/Informed Consent The risks [stroke (1 in 1000), death (1 in 1000), kidney failure [usually temporary] (1 in 500), bleeding (1 in 200), allergic reaction [possibly serious] (1 in 200)], benefits (diagnostic support and management of coronary artery disease) and alternatives of a cardiac catheterization were discussed in detail with Barbara Tucker and she is willing to proceed.  N.p.o. status to begin after breakfast as cath lab is booked until afternoon 9/2. Echo ordered, pending. Continue ASA.  Started low-dose carvedilol.  Consider statin before discharge. Further recommendations pending work-up as indicated.  Abnormal TSH --TSH elevated at ordered free T4.  Further recommendations per IM.  For questions or updates, please contact [facility] Please consult [email or web address] for contact info under    Signed, Manna Gose D Keryn Nessler, PA-C  07/09/2021 3:32 PM   

## 2021-07-09 NOTE — ED Triage Notes (Signed)
Pt comes into the ED via EMS from work, had near syncope while at work. Denies any pain, having dizziness with N/V  86/50, 122/70 after 512m NS #[id number] HR60 CBG124 98%RA

## 2021-07-09 NOTE — ED Notes (Signed)
Report given to Maureen, RN.

## 2021-07-09 NOTE — ED Notes (Signed)
US at bedside

## 2021-07-09 NOTE — Consult Note (Signed)
Cardiology Consultation:   Patient ID: Barbara Tucker MRN: [id number]; DOB: 1946/02/11  Admit date: 07/09/2021 Date of Consult: 07/09/2021  PCP:  Valerie Roys, DO   [place]  Cardiologist:  [facility], Dr. Rockey Situ Advanced Practice Provider:  No care team member to display Electrophysiologist:  None   Patient Profile:   Barbara Tucker is a 75 y.o. female with a hx of hypertension, hypothyroidism, rheumatoid arthritis, and who is being seen today for the evaluation of elevated troponin and near syncope at the request of Dr. Rowe Pavy.  History of Present Illness:   Barbara Tucker is a 75 year old female with PMH as above.  She has no previously known history of CAD or arrhythmia.  She has no known family history of early cardiac death.  Family history does include hypertension, stroke, and heart failure as below.  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  She does report a lot of stress surrounding giving the lecture itself.  She had not slept well the night before.  She does report that she was eating and drinking well.  During the lecture, she started to feel "not like herself."  She describes this feeling as difficult to describe but not quite weakness or dizziness.  She was seated at the time that this feeling began.  She then felt diaphoretic and nauseated.  She leaned forward on a table and noted the symptoms started to dissipate after a while.  However, they then returned, and she experienced several episodes of emesis.  She denies any chest pain or shortness of breath.  No syncope/ LOC.   Per EMS, BP 80s over 40s.  She received 500 cc of IVF.    She was taken to [facility] ED.  BP on arrival 128/88 with HR 69 bpm.  Labs showed stable renal function and potassium at goal.  COVID-19/flu negative.  HS Tn 323  2014.0.  EKG NSR, 61 bpm, poor R wave progression in III, V1-V3, nonspecific ST changes in V2 with  recommendation as below to trend and with repeat EKG pending.  H&H stable.  TSH elevated at 5.032.  LDL 72.  CT without evidence of pulmonary embolism and showed mild global cardiomegaly with aortic atherosclerosis.  Mild bibasilar subsegmental atelectasis also noted.  Carotids pending.   Past Medical History:  Diagnosis Date   Allergic rhinitis    Allergy    Endometriosis    Heart murmur 1975   minimal, seen on ultrasound long ago   Hypertension    Hypothyroidism    IBS (irritable bowel syndrome)    Migraine    Osteoarthritis    Rheumatoid arthritis ([place])     Past Surgical History:  Procedure Laterality Date   ABDOMINAL HYSTERECTOMY     APPENDECTOMY     BREAST EXCISIONAL BIOPSY Right 1975   excisional - negative   BREAST SURGERY  1975?   benign cyst removed   COLONOSCOPY WITH PROPOFOL N/A 02/03/2016   Procedure: COLONOSCOPY WITH PROPOFOL;  Surgeon: Lollie Sails, MD;  Location: [facility] ENDOSCOPY;  Service: Endoscopy;  Laterality: N/A;   OOPHORECTOMY Bilateral    TONSILLECTOMY     TUBAL LIGATION       Home Medications:  Prior to Admission medications   Medication Sig Start Date End Date Taking? Authorizing Provider  ascorbic acid (VITAMIN C) 500 MG tablet Take 500 mg by mouth daily.    [provider]  calcium carbonate (  OSCAL) 1500 (600 Ca) MG TABS tablet Take 600 mg of elemental calcium by mouth 2 (two) times daily with a meal.    [provider]  Calcium Polycarbophil (FIBER-CAPS PO) Take by mouth.    [provider]  COVID-19 mRNA Vac-TriS, Pfizer, (PFIZER-BIONT COVID-19 VAC-TRIS) SUSP injection Inject into the muscle. 03/13/21   Carlyle Basques, MD  dicyclomine (BENTYL) 10 MG capsule Take 10 mg by mouth 4 (four) times daily.    [provider]  estradiol (ESTRACE) 1 MG tablet Take 1 tablet (1 mg total) by mouth daily. 02/03/21   Johnson, Megan P, DO  etodolac (LODINE) 400 MG tablet Take 400 mg by mouth 2 (two) times daily.    [provider]  fexofenadine (ALLEGRA) 180 MG tablet Take 180 mg by mouth daily.    [provider]  folic acid (FOLVITE) 1 MG tablet Take 1 mg by mouth daily.    [provider]  guaiFENesin (MUCINEX) 600 MG 12 hr tablet Take by mouth 2 (two) times daily.    [provider]  hydroxychloroquine (PLAQUENIL) 200 MG tablet Take 200 mg by mouth daily.     [provider]  losartan (COZAAR) 50 MG tablet Take 1 tablet (50 mg total) by mouth daily. 02/03/21   Johnson, Megan P, DO  methotrexate (RHEUMATREX) 2.5 MG tablet Take 15 mg by mouth once a week.  07/18/19   [provider]  metoprolol succinate (TOPROL-XL) 25 MG 24 hr tablet TAKE 1 TABLET BY MOUTH DAILY 06/24/21   Park Liter P, DO  Multiple Vitamin (MULTIVITAMIN) tablet Take 1 tablet by mouth daily.    [provider]  Omega-3 Fatty Acids (OMEGA-3 EPA FISH OIL PO) Take 340-1,000 mg by mouth daily.    [provider]  omeprazole (PRILOSEC) 20 MG capsule Take 20 mg by mouth daily.    [provider]    Inpatient Medications: Scheduled Meds:  [START ON 07/10/2021] aspirin  81 mg Oral Daily   heparin  4,000 Units Intravenous Once   Continuous Infusions:  heparin     PRN Meds: acetaminophen **OR** acetaminophen, HYDROcodone-acetaminophen, morphine injection, ondansetron **OR** ondansetron (ZOFRAN) IV, polyethylene glycol  Allergies:    Allergies  Allergen Reactions   Codeine    Lisinopril Other (See Comments)    Migraine   Sulfa Antibiotics     Social History:   Social History   Socioeconomic History   Marital status: Married    Spouse name: Not on file   Number of children: Not on file   Years of education: Not on file   Highest education level: Not on file  Occupational History   Occupation: retired  Tobacco Use   Smoking status: Never   Smokeless tobacco: Never   Tobacco comments:    never smoked, around lots of second hand  Vaping Use   Vaping Use:  Never used  Substance and Sexual Activity   Alcohol use: Not Currently   Drug use: No   Sexual activity: Not Currently  Other Topics Concern   Not on file  Social History Narrative   Not on file   Social Determinants of Health   Financial Resource Strain: Low Risk    Difficulty of Paying Living Expenses: Not hard at all  Food Insecurity: No Food Insecurity   Worried About Charity fundraiser in the Last Year: Never true   [place] in the Last Year: Never true  Transportation Needs: No Transportation Needs  Lack of Transportation (Medical): No   Lack of Transportation (Non-Medical): No  Physical Activity: Sufficiently Active   Days of Exercise per Week: 5 days   Minutes of Exercise per Session: 30 min  Stress: No Stress Concern Present   Feeling of Stress : Not at all  Social Connections: Not on file  Intimate Partner Violence: Not on file    Family History:    Family History  Problem Relation Age of Onset   Breast cancer Paternal Grandmother    Cancer Paternal Grandmother        Breast   Heart disease Paternal Grandmother    Stroke Mother    Arthritis Mother        Osteo and rheumatoid   Hypertension Mother    Angina Mother    Vision loss Mother    Cancer Father        Multiple Myeloma   Gout Father    Cancer Sister        Breast   Heart disease Sister    Cancer Sister        Skin   Cancer Brother        prostate     ROS:  Please see the history of present illness.  Review of Systems  Constitutional:  Positive for diaphoresis.  Respiratory:  Negative for shortness of breath.   Cardiovascular:  Negative for chest pain and palpitations.  Gastrointestinal:  Positive for nausea and vomiting.  Musculoskeletal:  Negative for falls.  Neurological:  Negative for loss of consciousness.       Weakness rather than dizziness  Psychiatric/Behavioral:         Anxiety   All other ROS reviewed and negative.     Physical Exam/Data:   Vitals:   07/09/21  1218 07/09/21 1245 07/09/21 1300 07/09/21 1330  BP:  140/69 138/73 122/80  Pulse:  63 73 69  Resp:    18  Temp:      TempSrc:      SpO2:  97% 97% 99%  Weight: 65.4 kg     Height: 5' 7" (1.702 m)      No intake or output data in the 24 hours ending 07/09/21 1532 Last 3 Weights 07/09/2021 02/03/2021 08/15/2020  Weight (lbs) 144 lb 2.9 oz 144 lb 3.2 oz 145 lb  Weight (kg) 65.4 kg 65.409 kg 65.772 kg     Body mass index is 22.58 kg/m.  General:  Well nourished, well developed, in no acute distress.  Joined by her husband. HEENT: normal Lymph: no adenopathy Neck: no JVD Endocrine:  No thryomegaly Vascular: No carotid bruits; FA pulses 2+ bilaterally without bruits  Cardiac:  normal S1, S2; RRR; no murmur  Lungs: Bibasilar dry crackles. No wheezing, rhonchi or rales  Abd: soft, nontender, no hepatomegaly  Ext: no edema Musculoskeletal:  No deformities, BUE and BLE strength normal and equal Skin: warm and dry  Neuro:  CNs 2-12 intact, no focal abnormalities noted Psych:  Normal affect   EKG:  The EKG was personally reviewed and demonstrates:  NSR, 61 bpm, poor R wave progression in III, V1-V3, nonspecific ST changes V2 with repeat EKG pending.  Telemetry:  Telemetry was personally reviewed and demonstrates: Telemetry leads not connected given patient just returned from recent CT scan  Relevant CV Studies: Echo pending  Laboratory Data:  High Sensitivity Troponin:   Recent Labs  Lab 07/09/21 1217 07/09/21 1403  TROPONINIHS 323* 2,014*     Chemistry Recent Labs  Lab  07/09/21 1217  NA 135  K 4.0  CL 101  CO2 28  GLUCOSE 121*  BUN 12  CREATININE 0.75  CALCIUM 9.1  GFRNONAA >60  ANIONGAP 6    No results for input(s): PROT, ALBUMIN, AST, ALT, ALKPHOS, BILITOT in the last 168 hours. Hematology Recent Labs  Lab 07/09/21 1217  WBC 8.7  RBC 4.00  HGB 12.8  HCT 36.9  MCV 92.3  MCH 32.0  MCHC 34.7  RDW 13.0  PLT 244   BNPNo results for input(s): BNP, PROBNP in  the last 168 hours.  DDimer No results for input(s): DDIMER in the last 168 hours.   Radiology/Studies:  No results found.   Assessment and Plan:   NSTEMI -- No chest pain at any time leading up to admission.  Reports feeling a weakness while seated and associated with diaphoresis and nausea/emesis.  She does admit to stress surrounding giving her a lecture.  CT without evidence of pulmonary embolism and showing cardiomegaly.  HS Tn 323.0  2014.0 and still cycling.  EKG with nonspecific changes as above and pending repeat EKG.  Echo ordered, pending.  Carotids pending given weakness reported. Given her symptoms as detailed in HPI above and elevated troponin, as well as cardiomegaly on her imaging, further ischemic work-up recommended to rule out coronary insufficiency. Continue IV heparin. HS Tn ordered to continue to trend and until peaked, downtrending. Repeat EKG ordered - trend with serial EKGs. Plan for LHC with Dr. Fletcher Anon on 07/10/21.  Shared Decision Making/Informed Consent The risks [stroke (1 in 1000), death (1 in 1000), kidney failure [usually temporary] (1 in 500), bleeding (1 in 200), allergic reaction [possibly serious] (1 in 200)], benefits (diagnostic support and management of coronary artery disease) and alternatives of a cardiac catheterization were discussed in detail with Ms. Cariker and she is willing to proceed.  N.p.o. status to begin after breakfast as cath lab is booked until afternoon 9/2. Echo ordered, pending. Continue ASA.  Started low-dose carvedilol.  Consider statin before discharge. Further recommendations pending work-up as indicated.  Abnormal TSH --TSH elevated at ordered free T4.  Further recommendations per IM.  For questions or updates, please contact [place] Please consult [email or web address] for contact info under    Signed, Arvil Chaco, PA-C  07/09/2021 3:32 PM

## 2021-07-09 NOTE — ED Triage Notes (Signed)
Pt comes into the ED via [facility] from [facility] c/o near syncopal episode.  Pt states she was sitting and talking to a class.  Pt does state that she ate breakfast this morning.  Pt states that she did have some SHOB and nausea that started before the "near syncopal episode".  Pt in NAD at this time with even and unlabored respirations.  Pt neurologically intact,.

## 2021-07-09 NOTE — Consult Note (Signed)
ANTICOAGULATION CONSULT NOTE - Initial Consult  Pharmacy Consult for Heparin Indication: chest pain/ACS  Allergies  Allergen Reactions   Codeine    Lisinopril Other (See Comments)    Migraine   Sulfa Antibiotics     Patient Measurements: Height: 5\' 7"  (170.2 cm) Weight: 65.4 kg (144 lb 2.9 oz) IBW/kg (Calculated) : 61.6 Heparin Dosing Weight: 65.4 kg  Vital Signs: Temp: 97.8 F (36.6 C) (09/01 1216) Temp Source: Oral (09/01 1216) BP: 122/80 (09/01 1330) Pulse Rate: 69 (09/01 1330)  Labs: Recent Labs    07/09/21 1217  HGB 12.8  HCT 36.9  PLT 244  CREATININE 0.75  TROPONINIHS 323*    Estimated Creatinine Clearance: 59.1 mL/min (by C-G formula based on SCr of 0.75 mg/dL).   Medical History: Past Medical History:  Diagnosis Date   Allergic rhinitis    Allergy    Endometriosis    Heart murmur 1975   minimal, seen on ultrasound long ago   Hypertension    Hypothyroidism    IBS (irritable bowel syndrome)    Migraine    Osteoarthritis    Rheumatoid arthritis (HCC)     Medications:  (Not in a hospital admission)  Scheduled:  Infusions:  PRN:  Anti-infectives (From admission, onward)    None       Assessment: Pharmacy consulted to start heparin for ACS. No DOAC PTA. Trop elevated 323. CBC stable.   Goal of Therapy:  Heparin level 0.3-0.7 units/ml Monitor platelets by anticoagulation protocol: Yes   Plan:  Give 4000 units bolus x 1 Start heparin infusion at 800 units/hr Check anti-Xa level in 8 hours and daily while on heparin Continue to monitor H&H and platelets  09/08/21, PharmD, BCPS 07/09/2021,2:20 PM

## 2021-07-09 NOTE — ED Notes (Signed)
Dr. Paduchowski at bedside.  

## 2021-07-09 NOTE — ED Notes (Signed)
Pt with c/o nausea, pt given zofran per orders.

## 2021-07-10 ENCOUNTER — Encounter
Admission: EM | Disposition: A | Discharge status: Home / Self Care | Payer: Self-pay | Source: Home / Self Care | Attending: Emergency Medicine

## 2021-07-10 ENCOUNTER — Inpatient Hospital Stay (HOSPITAL_COMMUNITY)
Admit: 2021-07-10 | Discharge: 2021-07-10 | Disposition: A | Discharge status: Home / Self Care | Payer: Medicare Other | Attending: Physician Assistant | Admitting: Physician Assistant

## 2021-07-10 DIAGNOSIS — I214 Non-ST elevation (NSTEMI) myocardial infarction: Secondary | ICD-10-CM

## 2021-07-10 DIAGNOSIS — I5181 Takotsubo syndrome: Secondary | ICD-10-CM | POA: Diagnosis not present

## 2021-07-10 DIAGNOSIS — R55 Syncope and collapse: Secondary | ICD-10-CM | POA: Diagnosis not present

## 2021-07-10 HISTORY — PX: LEFT HEART CATH AND CORONARY ANGIOGRAPHY: CATH118249

## 2021-07-10 LAB — CBC
HCT: 32.1 % — ABNORMAL LOW (ref 36.0–46.0)
Hemoglobin: 11.5 g/dL — ABNORMAL LOW (ref 12.0–15.0)
MCH: 32.7 pg (ref 26.0–34.0)
MCHC: 35.8 g/dL (ref 30.0–36.0)
MCV: 91.2 fL (ref 80.0–100.0)
Platelets: 225 10*3/uL (ref 150–400)
RBC: 3.52 MIL/uL — ABNORMAL LOW (ref 3.87–5.11)
RDW: 13.2 % (ref 11.5–15.5)
WBC: 7.5 10*3/uL (ref 4.0–10.5)
nRBC: 0 % (ref 0.0–0.2)

## 2021-07-10 LAB — BASIC METABOLIC PANEL
Anion gap: 8 (ref 5–15)
BUN: 9 mg/dL (ref 8–23)
CO2: 24 mmol/L (ref 22–32)
Calcium: 8.8 mg/dL — ABNORMAL LOW (ref 8.9–10.3)
Chloride: 102 mmol/L (ref 98–111)
Creatinine, Ser: 0.67 mg/dL (ref 0.44–1.00)
GFR, Estimated: >60 mL/min (ref >60)
Glucose, Bld: 130 mg/dL — ABNORMAL HIGH (ref 70–99)
Potassium: 3.4 mmol/L — ABNORMAL LOW (ref 3.5–5.1)
Sodium: 134 mmol/L — ABNORMAL LOW (ref 135–145)

## 2021-07-10 LAB — ECHOCARDIOGRAM COMPLETE
Height: 67 in
S' Lateral: 3.6 cm
Weight: 2304 oz

## 2021-07-10 LAB — CARDIAC CATHETERIZATION: Cath EF Quantitative: 40 %

## 2021-07-10 LAB — HEPARIN LEVEL (UNFRACTIONATED): Heparin Unfractionated: 0.13 IU/mL — ABNORMAL LOW (ref 0.30–0.70)

## 2021-07-10 SURGERY — LEFT HEART CATH AND CORONARY ANGIOGRAPHY
Anesthesia: Moderate Sedation

## 2021-07-10 MED ORDER — HEPARIN BOLUS VIA INFUSION
2000.0000 [IU] | Freq: Once | INTRAVENOUS | Status: DC
  Filled 2021-07-10: qty 2000

## 2021-07-10 MED ORDER — MIDAZOLAM HCL 2 MG/2ML IJ SOLN
INTRAMUSCULAR | Status: AC
  Filled 2021-07-10: qty 2

## 2021-07-10 MED ORDER — SODIUM CHLORIDE 0.9% FLUSH: 3.0000 mL | Freq: Two times a day (BID) | INTRAVENOUS | Status: DC

## 2021-07-10 MED ORDER — HEPARIN (PORCINE) IN NACL 2000-0.9 UNIT/L-% IV SOLN
INTRAVENOUS | Status: DC | PRN
Start: 2021-07-10 — End: 2021-07-10
  Administered 2021-07-10: 1000 mL

## 2021-07-10 MED ORDER — SODIUM CHLORIDE 0.9 % IV SOLN: INTRAVENOUS | Status: DC

## 2021-07-10 MED ORDER — SODIUM CHLORIDE 0.9 % IV SOLN: 250.0000 mL | INTRAVENOUS | Status: DC | PRN

## 2021-07-10 MED ORDER — HEPARIN (PORCINE) IN NACL 1000-0.9 UT/500ML-% IV SOLN
INTRAVENOUS | Status: AC
  Filled 2021-07-10: qty 1000

## 2021-07-10 MED ORDER — ASPIRIN 81 MG PO CHEW: 81.0000 mg | CHEWABLE_TABLET | ORAL | Status: DC

## 2021-07-10 MED ORDER — LIDOCAINE HCL 1 % IJ SOLN
INTRAMUSCULAR | Status: AC
  Filled 2021-07-10: qty 20

## 2021-07-10 MED ORDER — ETODOLAC 400 MG PO TABS: 400.0000 mg | ORAL_TABLET | Freq: Two times a day (BID) | ORAL | 3 refills | Status: AC | PRN

## 2021-07-10 MED ORDER — VERAPAMIL HCL 2.5 MG/ML IV SOLN
INTRAVENOUS | Status: DC | PRN
  Administered 2021-07-10: 2.5 mg via INTRA_ARTERIAL

## 2021-07-10 MED ORDER — FENTANYL CITRATE PF 50 MCG/ML IJ SOSY
PREFILLED_SYRINGE | INTRAMUSCULAR | Status: AC
  Filled 2021-07-10: qty 1

## 2021-07-10 MED ORDER — HEPARIN SODIUM (PORCINE) 1000 UNIT/ML IJ SOLN
INTRAMUSCULAR | Status: DC | PRN
  Administered 2021-07-10: 3500 [IU] via INTRAVENOUS

## 2021-07-10 MED ORDER — HEPARIN SODIUM (PORCINE) 1000 UNIT/ML IJ SOLN
INTRAMUSCULAR | Status: AC
  Filled 2021-07-10: qty 1

## 2021-07-10 MED ORDER — METHOTREXATE 2.5 MG PO TABS
20.0000 mg | ORAL_TABLET | ORAL | Status: DC
  Filled 2021-07-10: qty 8

## 2021-07-10 MED ORDER — SODIUM CHLORIDE 0.9% FLUSH
3.0000 mL | Freq: Two times a day (BID) | INTRAVENOUS | Status: DC
  Administered 2021-07-10: 3 mL via INTRAVENOUS

## 2021-07-10 MED ORDER — IOHEXOL 350 MG/ML SOLN
INTRAVENOUS | Status: DC | PRN
  Administered 2021-07-10: 35 mL

## 2021-07-10 MED ORDER — MIDAZOLAM HCL 2 MG/2ML IJ SOLN
INTRAMUSCULAR | Status: DC | PRN
  Administered 2021-07-10: 1 mg via INTRAVENOUS

## 2021-07-10 MED ORDER — DICYCLOMINE HCL 10 MG PO CAPS: 10.0000 mg | ORAL_CAPSULE | Freq: Four times a day (QID) | ORAL | Status: DC | PRN

## 2021-07-10 MED ORDER — VERAPAMIL HCL 2.5 MG/ML IV SOLN
INTRAVENOUS | Status: AC
  Filled 2021-07-10: qty 2

## 2021-07-10 MED ORDER — SODIUM CHLORIDE 0.9% FLUSH: 3.0000 mL | INTRAVENOUS | Status: DC | PRN

## 2021-07-10 MED ORDER — LIDOCAINE HCL (PF) 1 % IJ SOLN
INTRAMUSCULAR | Status: DC | PRN
  Administered 2021-07-10: 2 mL

## 2021-07-10 MED ORDER — FENTANYL CITRATE (PF) 100 MCG/2ML IJ SOLN
INTRAMUSCULAR | Status: DC | PRN
  Administered 2021-07-10: 25 ug via INTRAVENOUS

## 2021-07-10 SURGICAL SUPPLY — 11 items
CATH INFINITI 5FR JK (CATHETERS) ×2 IMPLANT
DEVICE RAD TR BAND REGULAR (VASCULAR PRODUCTS) ×2 IMPLANT
DRAPE BRACHIAL (DRAPES) ×2 IMPLANT
GLIDESHEATH SLEND SS 6F .021 (SHEATH) ×2 IMPLANT
GUIDEWIRE INQWIRE 1.5J.035X260 (WIRE) ×1 IMPLANT
INQWIRE 1.5J .035X260CM (WIRE) ×2
PACK CARDIAC CATH (CUSTOM PROCEDURE TRAY) ×2 IMPLANT
PROTECTION STATION PRESSURIZED (MISCELLANEOUS) ×2
SET ATX SIMPLICITY (MISCELLANEOUS) ×2 IMPLANT
STATION PROTECTION PRESSURIZED (MISCELLANEOUS) ×1 IMPLANT
WIRE HITORQ VERSACORE ST 145CM (WIRE) ×2 IMPLANT

## 2021-07-10 NOTE — Discharge Summary (Signed)
DISCHARGE SUMMARY  Barbara Tucker  MR#: [id number]  DOB:1945/12/20  Date of Admission: 07/09/2021 Date of Discharge: 07/10/2021  Attending Physician:Matalynn Graff Silvestre Gunner, MD  Patient's XNA:[id number], Oralia Rud, DO  Consults: [facility] Cardiology   Disposition: D/C home   Follow-up Appts:  Follow-up Information     Olevia Perches P, DO Follow up in 1 week(s).   Specialty: Family Medicine Contact information: [address] [place] [address] [phone]                 Discharge Diagnoses: Stress-induced Takotsubo cardiomyopathy NSTEMI HTN GERD RA  Initial presentation: 75 year old with a history of HTN, GERD, and RA on chronic immunosuppressive therapy who presented to the ER with a near syncopal spell while at work.  She was giving a Buyer, retail at [facility] and shortly thereafter began to feel short of breath and nauseous.  She vomited.  She became diaphoretic.  She felt that she was about to lose consciousness but did not.  EMS was summoned and found her blood pressure to be 80/40.  This quickly improved with a 500 cc bolus.  Hospital Course: The patient was admitted to the acute units and cardiology was consulted.  CT of the chest revealed no pulmonary embolism but did suggest aortic atherosclerosis.  Carotid ultrasound noted no significant disease.  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  Clinically the patient stabilized quickly after her admission.  Given her markedly elevated cardiac enzymes she was taken to the cardiac Cath Lab 07/10/2021.  Fortunately only normal coronary arteries were noted on cardiac catheterization, and wall motion abnormalities were in a pattern suggestive of Takotsubo stress-induced cardiomyopathy.  The patient recovered quickly and without incident from her cardiac cath.  Her other medical issues remained perfectly stable.  She was cleared for  discharge home by cardiology to continue her beta-blocker and ARB.  Allergies as of 07/10/2021       Reactions   Codeine    Lisinopril Other (See Comments)   Migraine   Sulfa Antibiotics         Medication List     TAKE these medications    amoxicillin 500 MG capsule Commonly known as: AMOXIL Take 2,000 mg by mouth as needed.   ascorbic acid 500 MG tablet Commonly known as: VITAMIN C Take 500 mg by mouth daily.   calcium carbonate 1500 (600 Ca) MG Tabs tablet Commonly known as: OSCAL Take 600 mg of elemental calcium by mouth 2 (two) times daily with a meal.   dicyclomine 10 MG capsule Commonly known as: BENTYL Take 1 capsule (10 mg total) by mouth 4 (four) times daily as needed for spasms. What changed:  when to take this reasons to take this   estradiol 1 MG tablet Commonly known as: ESTRACE Take 1 tablet (1 mg total) by mouth daily.   etodolac 400 MG tablet Commonly known as: LODINE Take 1 tablet (400 mg total) by mouth 2 (two) times daily as needed. What changed:  when to take this reasons to take this   fexofenadine 180 MG tablet Commonly known as: ALLEGRA Take 180 mg by mouth daily.   FIBER-CAPS PO Take by mouth.   folic acid 1 MG tablet Commonly known as: FOLVITE Take 1 mg by mouth daily.   guaiFENesin 600 MG 12 hr tablet Commonly known as: MUCINEX Take by mouth 2 (two) times daily.  Humira Pen 40 MG/0.4ML Pnkt Generic drug: Adalimumab Inject 40 mg into the muscle every 14 (fourteen) days.   losartan 50 MG tablet Commonly known as: COZAAR Take 1 tablet (50 mg total) by mouth daily.   methotrexate 2.5 MG tablet Commonly known as: RHEUMATREX Take 15 mg by mouth once a week.   metoprolol succinate 25 MG 24 hr tablet Commonly known as: TOPROL-XL TAKE 1 TABLET BY MOUTH DAILY   multivitamin tablet Take 1 tablet by mouth daily.   OMEGA-3 EPA FISH OIL PO Take 340-1,000 mg by mouth daily.   omeprazole 20 MG capsule Commonly known as:  PRILOSEC Take 20 mg by mouth daily.   Pfizer-BioNT COVID-19 Vac-TriS Susp injection Generic drug: COVID-19 mRNA Vac-TriS (Pfizer) Inject into the muscle.        Day of Discharge BP (!) 102/47   Pulse 60   Temp 98.2 F (36.8 C) (Oral)   Resp 13   Ht 5\' 7"  (1.702 m)   Wt 65.3 kg   SpO2 96%   BMI 22.55 kg/m   Physical Exam: All direct care on the day of the patient's discharge was provided by the Cardiology service.  Basic Metabolic Panel: Recent Labs  Lab 07/09/21 1217 07/10/21 0827  NA 135 134*  K 4.0 3.4*  CL 101 102  CO2 28 24  GLUCOSE 121* 130*  BUN 12 9  CREATININE 0.75 0.67  CALCIUM 9.1 8.8*     Coags: Recent Labs  Lab 07/09/21 1217  INR 1.1     CBC: Recent Labs  Lab 07/09/21 1217 07/10/21 0827  WBC 8.7 7.5  HGB 12.8 11.5*  HCT 36.9 32.1*  MCV 92.3 91.2  PLT 244 225    Recent Results (from the past 240 hour(s))  Resp Panel by RT-PCR (Flu A&B, Covid) Nasopharyngeal Swab     Status: None   Collection Time: 07/09/21  2:41 PM   Specimen: Nasopharyngeal Swab; Nasopharyngeal(NP) swabs in vial transport medium  Result Value Ref Range Status   SARS Coronavirus 2 by RT PCR NEGATIVE NEGATIVE Final    Comment: (NOTE) SARS-CoV-2 target nucleic acids are NOT DETECTED.  The SARS-CoV-2 RNA is generally detectable in upper respiratory specimens during the acute phase of infection. The lowest concentration of SARS-CoV-2 viral copies this assay can detect is 138 copies/mL. A negative result does not preclude SARS-Cov-2 infection and should not be used as the sole basis for treatment or other patient management decisions. A negative result may occur with  improper specimen collection/handling, submission of specimen other than nasopharyngeal swab, presence of viral mutation(s) within the areas targeted by this assay, and inadequate number of viral copies(<138 copies/mL). A negative result must be combined with clinical observations, patient history,  and epidemiological information. The expected result is Negative.  Fact Sheet for Patients:  BloggerCourse.com  Fact Sheet for Healthcare Providers:  SeriousBroker.it  This test is no t yet approved or cleared by the [place] FDA and  has been authorized for detection and/or diagnosis of SARS-CoV-2 by FDA under an Emergency Use Authorization (EUA). This EUA will remain  in effect (meaning this test can be used) for the duration of the COVID-19 declaration under Section 564(b)(1) of the Act, 21 U.S.C.section 360bbb-3(b)(1), unless the authorization is terminated  or revoked sooner.       Influenza A by PCR NEGATIVE NEGATIVE Final   Influenza B by PCR NEGATIVE NEGATIVE Final    Comment: (NOTE) The Xpert Xpress SARS-CoV-2/FLU/RSV plus assay is intended as an  aid in the diagnosis of influenza from Nasopharyngeal swab specimens and should not be used as a sole basis for treatment. Nasal washings and aspirates are unacceptable for Xpert Xpress SARS-CoV-2/FLU/RSV testing.  Fact Sheet for Patients: BloggerCourse.com  Fact Sheet for Healthcare Providers: SeriousBroker.it  This test is not yet approved or cleared by the [place] FDA and has been authorized for detection and/or diagnosis of SARS-CoV-2 by FDA under an Emergency Use Authorization (EUA). This EUA will remain in effect (meaning this test can be used) for the duration of the COVID-19 declaration under Section 564(b)(1) of the Act, 21 U.S.C. section 360bbb-3(b)(1), unless the authorization is terminated or revoked.  Performed at [facility], [address] [place], [place] [address]       Time spent in discharge (includes decision making & examination of pt): 10 minutes - pt not seen by [facility] MD at time of d/c as MD was occupied with acutely ill patients at time patient became ready for D/C home - pt  was under expert care of Cardiology service who performed cath and physical exam on day of d/c, and subsequently cleared her for d/c home   07/10/2021, 6:18 PM   Lonia Blood, MD [facility] Office  [phone]

## 2021-07-10 NOTE — Interval H&P Note (Signed)
History and Physical Interval Note:  07/10/2021 1:58 PM  Barbara Tucker  has presented today for surgery, with the diagnosis of Non ST Elevation Myocardial Infarction.  The various methods of treatment have been discussed with the patient and family. After consideration of risks, benefits and other options for treatment, the patient has consented to  Procedure(s): LEFT HEART CATH AND CORONARY ANGIOGRAPHY (N/A) as a surgical intervention.  The patient's history has been reviewed, patient examined, no change in status, stable for surgery.  I have reviewed the patient's chart and labs.  Questions were answered to the patient's satisfaction.     Lorine Bears

## 2021-07-10 NOTE — Consult Note (Signed)
ANTICOAGULATION CONSULT NOTE - Initial Consult  Pharmacy Consult for Heparin Indication: chest pain/ACS  Allergies  Allergen Reactions   Codeine    Lisinopril Other (See Comments)    Migraine   Sulfa Antibiotics     Patient Measurements: Height: 5\' 7"  (170.2 cm) Weight: 65.3 kg (144 lb) IBW/kg (Calculated) : 61.6 Heparin Dosing Weight: 65.4 kg  Vital Signs: Temp: 98.3 F (36.8 C) (09/02 0027) Temp Source: Oral (09/02 0027) BP: 100/54 (09/02 0027) Pulse Rate: 64 (09/02 0027)  Labs: Recent Labs    07/09/21 1217 07/09/21 1403 07/09/21 1711 07/09/21 1952 07/09/21 2243  HGB 12.8  --   --   --   --   HCT 36.9  --   --   --   --   PLT 244  --   --   --   --   APTT 24  --   --   --   --   LABPROT 13.7  --   --   --   --   INR 1.1  --   --   --   --   HEPARINUNFRC  --   --   --   --  0.35  CREATININE 0.75  --   --   --   --   TROPONINIHS 323* 2,014* 4,423* 4,091*  --      Estimated Creatinine Clearance: 59.1 mL/min (by C-G formula based on SCr of 0.75 mg/dL).   Medical History: Past Medical History:  Diagnosis Date   Allergic rhinitis    Allergy    Endometriosis    Heart murmur 1975   minimal, seen on ultrasound long ago   Hypertension    Hypothyroidism    IBS (irritable bowel syndrome)    Migraine    Osteoarthritis    Rheumatoid arthritis (HCC)     Medications:  Medications Prior to Admission  Medication Sig Dispense Refill Last Dose   ascorbic acid (VITAMIN C) 500 MG tablet Take 500 mg by mouth daily.   07/09/2021   calcium carbonate (OSCAL) 1500 (600 Ca) MG TABS tablet Take 600 mg of elemental calcium by mouth 2 (two) times daily with a meal.   07/09/2021   estradiol (ESTRACE) 1 MG tablet Take 1 tablet (1 mg total) by mouth daily. 90 tablet 1 07/09/2021 at 0730   fexofenadine (ALLEGRA) 180 MG tablet Take 180 mg by mouth daily.   07/09/2021   folic acid (FOLVITE) 1 MG tablet Take 1 mg by mouth daily.   07/09/2021   losartan (COZAAR) 50 MG tablet Take 1 tablet  (50 mg total) by mouth daily. 90 tablet 1 07/09/2021 at 0730   metoprolol succinate (TOPROL-XL) 25 MG 24 hr tablet TAKE 1 TABLET BY MOUTH DAILY 90 tablet 0 07/09/2021 at 0730   Multiple Vitamin (MULTIVITAMIN) tablet Take 1 tablet by mouth daily.   07/09/2021 at 0730   Omega-3 Fatty Acids (OMEGA-3 EPA FISH OIL PO) Take 340-1,000 mg by mouth daily.   07/09/2021 at 0730   omeprazole (PRILOSEC) 20 MG capsule Take 20 mg by mouth daily.   07/09/2021 at 0730   amoxicillin (AMOXIL) 500 MG capsule Take 2,000 mg by mouth as needed.   prn at prn   Calcium Polycarbophil (FIBER-CAPS PO) Take by mouth.   unknown at prn   COVID-19 mRNA Vac-TriS, Pfizer, (PFIZER-BIONT COVID-19 VAC-TRIS) SUSP injection Inject into the muscle. 0.3 mL 0    dicyclomine (BENTYL) 10 MG capsule Take 10 mg by mouth 4 (four)  times daily.   unknown at prn   etodolac (LODINE) 400 MG tablet Take 400 mg by mouth 2 (two) times daily.   unknown at prn   guaiFENesin (MUCINEX) 600 MG 12 hr tablet Take by mouth 2 (two) times daily.   unknown at prn   HUMIRA PEN 40 MG/0.4ML PNKT Inject 40 mg into the muscle every 14 (fourteen) days.   06/25/2021   methotrexate (RHEUMATREX) 2.5 MG tablet Take 15 mg by mouth once a week.    07/03/2021   Scheduled:  Infusions:  PRN:  Anti-infectives (From admission, onward)    None       Assessment: Pharmacy consulted to start heparin for ACS. No DOAC PTA. Trop elevated 323. CBC stable.   Goal of Therapy:  Heparin level 0.3-0.7 units/ml Monitor platelets by anticoagulation protocol: Yes   Plan:  9/1:  HL @ 2243 = 0.35 Will continue pt on current rate and draw confirmation level on 9/2 @ 0700.   Modesty Rudy D, PharmD 07/10/2021,12:33 AM

## 2021-07-10 NOTE — Consult Note (Signed)
ANTICOAGULATION CONSULT NOTE - Initial Consult  Pharmacy Consult for Heparin Indication: chest pain/ACS  Allergies  Allergen Reactions   Codeine    Lisinopril Other (See Comments)    Migraine   Sulfa Antibiotics     Patient Measurements: Height: 5\' 7"  (170.2 cm) Weight: 65.3 kg (144 lb) IBW/kg (Calculated) : 61.6 Heparin Dosing Weight: 65.4 kg  Vital Signs: Temp: 98.4 F (36.9 C) (09/02 0700) Temp Source: Oral (09/02 0700) BP: 105/52 (09/02 0700) Pulse Rate: 57 (09/02 0700)  Labs: Recent Labs    07/09/21 1217 07/09/21 1403 07/09/21 1711 07/09/21 1952 07/09/21 2243 07/10/21 0827  HGB 12.8  --   --   --   --  11.5*  HCT 36.9  --   --   --   --  32.1*  PLT 244  --   --   --   --  225  APTT 24  --   --   --   --   --   LABPROT 13.7  --   --   --   --   --   INR 1.1  --   --   --   --   --   HEPARINUNFRC  --   --   --   --  0.35 0.13*  CREATININE 0.75  --   --   --   --  0.67  TROPONINIHS 323* 2,014* 4,423* 4,091*  --   --      Estimated Creatinine Clearance: 59.1 mL/min (by C-G formula based on SCr of 0.67 mg/dL).   Medical History: Past Medical History:  Diagnosis Date   Allergic rhinitis    Allergy    Endometriosis    Heart murmur 1975   minimal, seen on ultrasound long ago   Hypertension    Hypothyroidism    IBS (irritable bowel syndrome)    Migraine    Osteoarthritis    Rheumatoid arthritis (HCC)     Medications:  Medications Prior to Admission  Medication Sig Dispense Refill Last Dose   ascorbic acid (VITAMIN C) 500 MG tablet Take 500 mg by mouth daily.   07/09/2021   calcium carbonate (OSCAL) 1500 (600 Ca) MG TABS tablet Take 600 mg of elemental calcium by mouth 2 (two) times daily with a meal.   07/09/2021   estradiol (ESTRACE) 1 MG tablet Take 1 tablet (1 mg total) by mouth daily. 90 tablet 1 07/09/2021 at 0730   fexofenadine (ALLEGRA) 180 MG tablet Take 180 mg by mouth daily.   07/09/2021   folic acid (FOLVITE) 1 MG tablet Take 1 mg by mouth  daily.   07/09/2021   losartan (COZAAR) 50 MG tablet Take 1 tablet (50 mg total) by mouth daily. 90 tablet 1 07/09/2021 at 0730   metoprolol succinate (TOPROL-XL) 25 MG 24 hr tablet TAKE 1 TABLET BY MOUTH DAILY 90 tablet 0 07/09/2021 at 0730   Multiple Vitamin (MULTIVITAMIN) tablet Take 1 tablet by mouth daily.   07/09/2021 at 0730   Omega-3 Fatty Acids (OMEGA-3 EPA FISH OIL PO) Take 340-1,000 mg by mouth daily.   07/09/2021 at 0730   omeprazole (PRILOSEC) 20 MG capsule Take 20 mg by mouth daily.   07/09/2021 at 0730   amoxicillin (AMOXIL) 500 MG capsule Take 2,000 mg by mouth as needed.   prn at prn   Calcium Polycarbophil (FIBER-CAPS PO) Take by mouth.   unknown at prn   COVID-19 mRNA Vac-TriS, Pfizer, (PFIZER-BIONT COVID-19 VAC-TRIS) SUSP injection Inject into the  muscle. 0.3 mL 0    dicyclomine (BENTYL) 10 MG capsule Take 10 mg by mouth 4 (four) times daily.   unknown at prn   etodolac (LODINE) 400 MG tablet Take 400 mg by mouth 2 (two) times daily.   unknown at prn   guaiFENesin (MUCINEX) 600 MG 12 hr tablet Take by mouth 2 (two) times daily.   unknown at prn   HUMIRA PEN 40 MG/0.4ML PNKT Inject 40 mg into the muscle every 14 (fourteen) days.   06/25/2021   methotrexate (RHEUMATREX) 2.5 MG tablet Take 15 mg by mouth once a week.    07/03/2021   Scheduled:  Infusions:  PRN:  Anti-infectives (From admission, onward)    None       Assessment: Pharmacy consulted to start heparin for ACS. No DOAC PTA. Trop elevated 323. CBC stable.   9/1:  HL @ 2243 = 0.35 9/2:  HL @ 0827 = 0.13 @ 800 units/hr  Goal of Therapy:  Heparin level 0.3-0.7 units/ml Monitor platelets by anticoagulation protocol: Yes   Plan:  Will bolus 2000 units and increase heparin drip to 1000 units/hr  Will check heparin level 8 hours after rate change CBC's daily on hepairn  Albina Billet, PharmD, BCPS Clinical Pharmacist 07/10/2021 12:35 PM

## 2021-07-10 NOTE — Progress Notes (Signed)
*  PRELIMINARY RESULTS* Echocardiogram 2D Echocardiogram has been performed.  Cristela Blue 07/10/2021, 8:01 AM

## 2021-07-10 NOTE — Progress Notes (Signed)
Progress Note  Patient Name: Barbara Tucker Date of Encounter: 07/10/2021  Primary Cardiologist: Dr. Mariah Milling, new [facility]  Subjective   No CP now or before admission.  No current SOB - recalls some SOB before admission.  Nausea overnight.  Emesis last night.  No emesis this AM. Did not keep down her dinner.  Back pain - attributes to bed and pinched nerve. Constant back pain, sharp, under her right shoulder, alleviated with changes in position.  NPO after some AM sips of water, pack of graham crackers (ate at 8AM).  LHC scheduled for 12:30PM. Advised to let RN staff know if nausea/emesis persists around the time of catheterization.  Inpatient Medications    Scheduled Meds:  ascorbic acid  500 mg Oral Daily   aspirin  81 mg Oral Daily   [START ON 07/11/2021] aspirin  81 mg Oral Pre-Cath   calcium carbonate  500 mg of elemental calcium Oral BID WC   carvedilol  3.125 mg Oral BID WC   estradiol  1 mg Oral Daily   folic acid  1 mg Oral Daily   loratadine  10 mg Oral Daily   losartan  50 mg Oral Daily   methotrexate  20 mg Oral Q Fri   multivitamin with minerals  1 tablet Oral Daily   pantoprazole  40 mg Oral Daily   sodium chloride flush  3 mL Intravenous Q12H   Continuous Infusions:  sodium chloride     sodium chloride 75 mL/hr at 07/10/21 0030   heparin 800 Units/hr (07/09/21 1553)   PRN Meds: sodium chloride, acetaminophen **OR** acetaminophen, HYDROcodone-acetaminophen, morphine injection, ondansetron **OR** ondansetron (ZOFRAN) IV, polyethylene glycol, sodium chloride flush   Vital Signs    Vitals:   07/09/21 2123 07/10/21 0027 07/10/21 0443 07/10/21 0700  BP: (!) 141/78 (!) 100/54 (!) 115/59 (!) 105/52  Pulse: 79 64 66 (!) 57  Resp: 20 19 19 18   Temp: 98.5 F (36.9 C) 98.3 F (36.8 C) 98.1 F (36.7 C) 98.4 F (36.9 C)  TempSrc: Oral Oral Oral Oral  SpO2: 96% 99% 98% 98%  Weight:      Height:        Intake/Output Summary (Last 24 hours) at  07/10/2021 0803 Last data filed at 07/10/2021 0600 Gross per 24 hour  Intake 619.57 ml  Output 150 ml  Net 469.57 ml   Last 3 Weights 07/09/2021 07/09/2021 02/03/2021  Weight (lbs) 144 lb 144 lb 2.9 oz 144 lb 3.2 oz  Weight (kg) 65.318 kg 65.4 kg 65.409 kg      Telemetry    NSR-SB - Personally Reviewed  ECG    NSR, 70bpm, IVCD, prolonged Qtc, suspected previous anterior infarct / poor R wave progression anterior leads VI-III- Personally Reviewed  Physical Exam   GEN: No acute distress.  Echo being performed Neck: No JVD Cardiac: RRR, no murmurs, rubs, or gallops.  Respiratory: Clear to auscultation bilaterally. GI: Soft, nontender, non-distended  MS: No edema; No deformity. Neuro:  Nonfocal  Psych: Normal affect   Labs    High Sensitivity Troponin:   Recent Labs  Lab 07/09/21 1217 07/09/21 1403 07/09/21 1711 07/09/21 1952  TROPONINIHS 323* 2,014* 4,423* 4,091*      Chemistry Recent Labs  Lab 07/09/21 1217  NA 135  K 4.0  CL 101  CO2 28  GLUCOSE 121*  BUN 12  CREATININE 0.75  CALCIUM 9.1  GFRNONAA >60  ANIONGAP 6     Hematology Recent Labs  Lab 07/09/21  1217  WBC 8.7  RBC 4.00  HGB 12.8  HCT 36.9  MCV 92.3  MCH 32.0  MCHC 34.7  RDW 13.0  PLT 244    BNPNo results for input(s): BNP, PROBNP in the last 168 hours.   DDimer No results for input(s): DDIMER in the last 168 hours.   Radiology    CT Angio Chest Pulmonary Embolism (PE) W or WO Contrast  Result Date: 07/09/2021 CLINICAL DATA:  75 year old female presenting with syncope. EXAM: CT ANGIOGRAPHY CHEST WITH CONTRAST TECHNIQUE: Multidetector CT imaging of the chest was performed using the standard protocol during bolus administration of intravenous contrast. Multiplanar CT image reconstructions and MIPs were obtained to evaluate the vascular anatomy. CONTRAST:  Seventy-five mL Omnipaque 350, intravenous COMPARISON:  None. FINDINGS: Cardiovascular: Satisfactory opacification of the pulmonary  arteries to the segmental level. No evidence of pulmonary embolism. Mild global cardiomegaly. No pericardial effusion. Mild scattered atherosclerotic calcifications of the thoraco abdominal aorta. Mediastinum/Nodes: No enlarged mediastinal, hilar, or axillary lymph nodes. Thyroid gland, trachea, and esophagus demonstrate no significant findings. Lungs/Pleura: Minimal bibasilar subsegmental atelectasis. Mild dependent mosaic attenuation, likely secondary to expiratory phase image acquisition. No suspicious pulmonary nodules. No pneumothorax or pleural effusion. No focal consolidations. Upper Abdomen: The visualized upper abdomen is within normal limits. Musculoskeletal: No chest wall abnormality. No acute or significant osseous findings. Review of the MIP images confirms the above findings. IMPRESSION: Vascular: 1. No evidence of pulmonary embolism. 2. Mild global cardiomegaly. 3.  Aortic Atherosclerosis (ICD10-I70.0). Non-Vascular: Mild bibasilar subsegmental atelectasis. Marliss Coots, MD [facility] Radiology Electronically Signed   By: Marliss Coots M.D.   On: 07/09/2021 15:56   US Carotid Bilateral  Result Date: 07/09/2021 CLINICAL DATA:  Near syncopal episode hypertension EXAM: BILATERAL CAROTID DUPLEX ULTRASOUND TECHNIQUE: Wallace Cullens scale imaging, color Doppler and duplex ultrasound were performed of bilateral carotid and vertebral arteries in the neck. COMPARISON:  None. FINDINGS: Criteria: Quantification of carotid stenosis is based on velocity parameters that correlate the residual internal carotid diameter with NASCET-based stenosis levels, using the diameter of the distal internal carotid lumen as the denominator for stenosis measurement. The following velocity measurements were obtained: RIGHT ICA: 62 cm/sec CCA: 76 cm/sec SYSTOLIC ICA/CCA RATIO:  0.8 ECA: 74 cm/sec LEFT ICA: 66 cm/sec CCA: 69 cm/sec SYSTOLIC ICA/CCA RATIO:  1.0 ECA: 52 cm/sec RIGHT CAROTID  ARTERY: Minimal plaque at the right carotid bulb RIGHT VERTEBRAL ARTERY: Antegrade flow in the right vertebral artery LEFT CAROTID ARTERY:  Minimal plaque at the ICA. LEFT VERTEBRAL ARTERY:  Antegrade flow in the left vertebral artery IMPRESSION: No hemodynamically significant stenosis identified Electronically Signed   By: Jasmine Pang M.D.   On: 07/09/2021 17:47    Cardiac Studies   Echo performed and pending official results  Patient Profile     75 y.o. female  hypertension, hypothyroidism, rheumatoid arthritis, and who is being seen today for the evaluation of NSTEMI.   Assessment & Plan    NSTEMI -- No current chest pain.  CT without evidence of pulmonary embolism and showing cardiomegaly.  Echo still pending official reading, performed this AM. HS Tn 323.0  2014.0  4423.0. and down-trending now.  EKG as above with poor R wave progression in VI-III, baseline wander.  Further ischemic workup recommended with LHC 9/2 at 12:30PM as cannot rule out HS Tn elevation 2/2 coronary insufficiency at this time. Continue IV heparin. LHC with Dr. Kirke Corin on 07/10/21.  Shared Decision Making/Informed Consent The risks [stroke (1  in 1000), death (1 in 1000), kidney failure [usually temporary] (1 in 500), bleeding (1 in 200), allergic reaction [possibly serious] (1 in 200)], benefits (diagnostic support and management of coronary artery disease) and alternatives of a cardiac catheterization were discussed in detail with Ms. Heindel and she is willing to proceed.  N.p.o. status started this AM. Pt will let RN know if nausea or emesis this AM, given upcoming cath and need to be still on table.  Echo performed, pending read. Continue ASA, BB. LDL checked- pending cath, consider statin before discharge with goal LDL below 70. Further medication and tx recommendations pending above work-up as indicated.    For questions or updates, please contact [facility] Please consult [email or web address] for contact info under         Signed, Lennon Alstrom, PA-C  07/10/2021, 8:03 AM

## 2021-07-10 NOTE — Care Management (Signed)
Medicare Outpatient Observation Notice   Patient name:  Camerin Ewan Patient number:  [id number]                                                                                                                                                                       You're a hospital outpatient receiving observation services. You are not an inpatient because:    You are in observation for workup for Chest Pain   Patient status is changed from inpatient to observation (outpatient)status as indicated under the Medicare Condition Code-44 Regulations, Chapter 100-04 of the Medicare Claims Processing Manual 50.3. Date of Status Change 07/10/2021 6:08 PM                                                                                                                                                                        Being an outpatient may affect what you pay in a hospital:   When you're a hospital outpatient, your observation stay is covered under Medicare Part B.   For Part B services, you generally pay:   A copayment for each outpatient hospital service you get. Part B copayments may vary by type of service.   20% of the Medicare-approved amount for most doctor services, after the Part B deductible.   Observation services may affect coverage and payment of your care after you leave the hospital:     If you need skilled nursing facility (SNF) care after you leave the hospital, Medicare Part A will only cover SNF care if you've had a 3-day minimum, medically necessary, inpatient hospital stay for a related illness or injury. An inpatient hospital stay begins the day the hospital admits you as an inpatient based on a doctor's order and doesn't include the day you're discharged.   If you have Medicaid, a Medicare Advantage plan or other health plan, Medicaid or the plan may have different rules for SNF coverage after you leave the hospital. Check with Medicaid or  your plan.   NOTE:  Medicare Part A generally doesn't cover outpatient hospital services, like an observation stay. However, Part A will generally cover medically necessary inpatient services if the hospital admits you as an inpatient based on a doctor's order. In most cases, you'll pay a one-time deductible for all of your inpatient hospital services for the first 60 days you're in a hospital.                                                                                                                                                                      If you have any questions about your observation services, ask the hospital staff member giving you this notice or the doctor providing your hospital care. You can also ask to speak with someone from the hospital's utilization or discharge planning department.   You can also call 1-800-MEDICARE [phone])625-0955).  [employer] users should call [phone].   Form CMS 10611-MOON   Expiration 11/07/2021 OMB APPROVAL [id number]          Your costs for medications:     Generally, prescription and over-the-counter drugs, including "self-administered drugs," you get in a hospital outpatient setting (like an emergency department) aren't covered by Part B. "Self- administered drugs" are drugs you'd normally take on your own. For safety reasons, many hospitals don't allow you to take medications brought from home. If you have a Medicare prescription drug plan (Part D), your plan may help you pay for these drugs. You'll likely need to pay out-of- pocket for these drugs and submit a claim to your drug plan for a refund. Contact your drug plan for more information.                                                                                                                                                                           If you're enrolled in a Medicare Advantage plan (like an HMO or PPO) or other Medicare health plan (Part  C), your costs and coverage may be different.  Check with your plan to find out about coverage for outpatient observation services.       If you're a Qualified Medicare Beneficiary through your state Medicaid program, you can't be billed for Part A or Part B deductibles, coinsurance, and copayments.                                                                                                                                                                      Additional Information (Optional):   Beecher Mcardle RNCM have reviewed form with Domenic Schwab and she is agreeable to verbal signature. A copy ofthis form has been provided to the patient                                                                                                                                                                             Please sign below to show you received and understand this notice.                                         Date: 07/10/21 / Time:6:05 PM   CMS does not discriminate in its programs and activities. To request this publication in alternative format, please call: 1-800-MEDICARE or email:[email or web address].   Form CMS 10611-MOON   Expiration 11/07/2021 OMB APPROVAL [id number]

## 2021-07-10 NOTE — Care Management Obs Status (Signed)
MEDICARE OBSERVATION STATUS NOTIFICATION   Patient Details  Name: Barbara Tucker MRN: [id number] Date of Birth: [birth year]   Medicare Observation Status Notification Given:  Waylan Boga, RN 07/10/2021, 6:21 PM

## 2021-07-10 NOTE — Care Management CC44 (Signed)
Condition Code 44 Documentation Completed  Patient Details  Name: Barbara Tucker MRN: [id number] Date of Birth: [birth year]   Condition Code 44 given:  Yes Patient signature on Condition Code 44 notice:  Yes Documentation of 2 MD's agreement:  Yes Code 44 added to claim:  Yes    Michel Bickers, RN 07/10/2021, 6:21 PM

## 2021-07-11 ENCOUNTER — Other Ambulatory Visit: Payer: Self-pay | Admitting: Physician Assistant

## 2021-07-12 ENCOUNTER — Telehealth: Payer: Self-pay | Admitting: Home Health

## 2021-07-12 NOTE — Telephone Encounter (Signed)
Patient called after hours line reporting blister around her wrist. Called back [phone], spoke directly to the patient. Patient states she had cardiac cath by Dr. Kary Kos 07/10/2021, continue to have gauze dressing in place of her wrist.  She states the needle site does not appear issues, the skin surrounding the needle site/where tape application locates developed a blister, there is mild erythema, minimal tenderness.  Suspect skin irritation from tape application . Advised patient to remove tape as well as dressing, okay to clean skin with soap and water, keep the area dry, avoid heavy weightlifting or bath/swimming, the blister might likely rupture later or self absorbed, if blister ruptures, may apply dressing over the site to allow wound healing.  Patient is agreeable with above plan.

## 2021-07-14 ENCOUNTER — Encounter: Payer: Self-pay | Admitting: Family Medicine

## 2021-07-14 ENCOUNTER — Ambulatory Visit: Payer: Self-pay

## 2021-07-14 ENCOUNTER — Encounter: Payer: Self-pay | Admitting: Cardiovascular Disease

## 2021-07-14 ENCOUNTER — Telehealth: Payer: Self-pay | Admitting: Cardiovascular Disease

## 2021-07-14 NOTE — Telephone Encounter (Signed)
Please see Patient advise request- please get scheduled with husband this week. OK to use same day.

## 2021-07-14 NOTE — Telephone Encounter (Signed)
TCM....   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     They need to be seen within 2wks   Pt not added wait list    Please

## 2021-07-14 NOTE — Telephone Encounter (Signed)
Routing to Dr. Johnson

## 2021-07-14 NOTE — Telephone Encounter (Signed)
Can we please see about getting them both in this week

## 2021-07-14 NOTE — Telephone Encounter (Signed)
Patient contacted regarding discharge from [facility] 07/10/21.  Patient understands to follow up with Marisue Ivan, PA on 07/17/21 at 11:30 AM at the [facility] office.   Patient understands discharge instructions? Yes Patient understands medications and regiment? Yes Patient understands to bring all medications to this visit? Yes   Pt has no further questions or concerns at this time.

## 2021-07-14 NOTE — Telephone Encounter (Signed)
Patient called and she says that she was trying to schedule an appointment, but the first available is outside of the 7 day window that the cardiologist wants her to be seen by Dr. Laural Benes. I advised at times the schedules don't always coincide with what is requested. I advised her first available is on Thursday 07/30/21, she asked for an afternoon appointment, scheduled for 07/30/21 at 1340 with Dr. Laural Benes. She says that she was exposed to someone who tested positive on 07/08/21, she tested on 07/13/21 and it was negative. I advised per CDC guidelines if tested negative day 3-5 of known exposure and no symptoms, no quarantine is needed, wear a mask around others for 5 days, she verbalized understanding. She asked me to send this to Dr. Laural Benes so she will be aware of what the cardiologist requests, I advised I will send this note to her.    Summary: Exposed COVID   Patient was advised by her cardiologist to schedule appointment with PCP in 7 days to discuss treatment for cardiomyopathy. Patient was exposed to COVID on 07/09/2021 and her COVID test came back negative. Patient is asymptomatic. Unsure okay to schedule appointment with PCP due to it being exposed and testing to early. Patient seeking clinical advice      Reason for Disposition . Requesting regular office appointment  Answer Assessment - Initial Assessment Questions 1. REASON FOR CALL or QUESTION: "What is your reason for calling today?" or "How can I best help you?" or "What question do you have that I can help answer?"     Calling to schedule an appointment  Protocols used: Information Only Call - No Triage-A-AH

## 2021-07-14 NOTE — Telephone Encounter (Signed)
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  She was discharged 07/10/21.  Can you please call and arrange / schedule for follow-up TCM appointment with Dr. Mariah Milling or APP within the next two weeks?  Thank you! Signed, Lennon Alstrom, PA-C 07/11/2021, 3:50 PM Pager [phone]

## 2021-07-14 NOTE — Telephone Encounter (Signed)
TCM....  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    They need to be seen within 2wks  Pt not added wait list   Please call

## 2021-07-14 NOTE — Telephone Encounter (Signed)
Called and scheduled patient for 07/17/21 at 10:40 with Dr. Laural Benes. Tried calling patient to advise her, LVM asking for her to please return my call.

## 2021-07-15 ENCOUNTER — Telehealth: Payer: Self-pay | Admitting: Cardiovascular Disease

## 2021-07-15 NOTE — Telephone Encounter (Signed)
Spoke with the patient. Advised her that it would be ok for her to self administer her eye drops.  Tomorrow will make a week post cath and that after tomorrow there are no restrictions related to the procedure. Patient verbalized understanding and voiced appreciation for the call back.

## 2021-07-15 NOTE — Telephone Encounter (Signed)
Patient states she had a cath on 9/1 and has a question regarding the use of her right arm. She is asking if it is okay that she squeeze her eye drop bottle. Please call to advise.

## 2021-07-17 ENCOUNTER — Ambulatory Visit: Payer: Medicare Other | Admitting: Physician Assistant

## 2021-07-17 ENCOUNTER — Ambulatory Visit: Payer: Medicare Other | Admitting: Family Medicine

## 2021-07-17 ENCOUNTER — Other Ambulatory Visit: Payer: Self-pay

## 2021-07-17 ENCOUNTER — Encounter: Payer: Self-pay | Admitting: Physician Assistant

## 2021-07-17 VITALS — BP 128/70 | HR 72 | Ht 67.0 in | Wt 144.5 lb

## 2021-07-17 DIAGNOSIS — I428 Other cardiomyopathies: Secondary | ICD-10-CM | POA: Diagnosis not present

## 2021-07-17 DIAGNOSIS — M059 Rheumatoid arthritis with rheumatoid factor, unspecified: Secondary | ICD-10-CM

## 2021-07-17 DIAGNOSIS — I10 Essential (primary) hypertension: Secondary | ICD-10-CM

## 2021-07-17 DIAGNOSIS — I5022 Chronic systolic (congestive) heart failure: Secondary | ICD-10-CM

## 2021-07-17 DIAGNOSIS — Z79899 Other long term (current) drug therapy: Secondary | ICD-10-CM

## 2021-07-17 DIAGNOSIS — I5181 Takotsubo syndrome: Secondary | ICD-10-CM | POA: Diagnosis not present

## 2021-07-17 DIAGNOSIS — I5020 Unspecified systolic (congestive) heart failure: Secondary | ICD-10-CM

## 2021-07-17 DIAGNOSIS — E038 Other specified hypothyroidism: Secondary | ICD-10-CM

## 2021-07-17 NOTE — Progress Notes (Signed)
Office Visit    Patient Name: Leyla Soliz Date of Encounter: 07/17/2021  PCP:  Dorcas Carrow DO   [facility]  Cardiologist:  Dr. Mariah Milling Advanced Practice Provider:  No care team member to display Electrophysiologist:  None [id number]}   Chief Complaint    Chief Complaint  Patient presents with   Other    F/u s/p cardiac cath no complaints today. Meds reviewed verbally with pt.    75 y.o. female  with a hx of hypertension, hypothyroidism, rheumatoid arthritis, and who is being seen today for hospital follow-up.  Past Medical History    Past Medical History:  Diagnosis Date   Allergic rhinitis    Allergy    Endometriosis    Heart murmur 1975   minimal, seen on ultrasound long ago   Hypertension    Hypothyroidism    IBS (irritable bowel syndrome)    Migraine    Osteoarthritis    Rheumatoid arthritis (HCC)    Past Surgical History:  Procedure Laterality Date   ABDOMINAL HYSTERECTOMY     APPENDECTOMY     BREAST EXCISIONAL BIOPSY Right 1975   excisional - negative   BREAST SURGERY  1975?   benign cyst removed   COLONOSCOPY WITH PROPOFOL N/A 02/03/2016   Procedure: COLONOSCOPY WITH PROPOFOL;  Surgeon: Christena Deem, MD;  Location: [facility] ENDOSCOPY;  Service: Endoscopy;  Laterality: N/A;   LEFT HEART CATH AND CORONARY ANGIOGRAPHY N/A 07/10/2021   Procedure: LEFT HEART CATH AND CORONARY ANGIOGRAPHY;  Surgeon: Iran Ouch, MD;  Location: [facility] INVASIVE CV LAB;  Service: Cardiovascular;  Laterality: N/A;   OOPHORECTOMY Bilateral    TONSILLECTOMY     TUBAL LIGATION      Allergies  Allergies  Allergen Reactions   Codeine    Lisinopril Other (See Comments)    Migraine   Sulfa Antibiotics     History of Present Illness    Jenin Birdsall is a 75 y.o. female with PMH as above. Ms. Coye is a 75 year old female with PMH as above.  She has no previously known history of CAD or arrhythmia.  She has no known family history  of early cardiac death.  Family history does include hypertension, stroke, and heart failure as below.   She recently presented to [facility] and underwent catheterization. 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 She had not slept well. During the lecture, she felt something "not quite like" weakness or dizziness.  She was seated at the time that this feeling began.  She then felt diaphoretic and nauseated.  She leaned forward on a table and noted the symptoms started to dissipate after a while.  However, they then returned, and she experienced several episodes of emesis.  When EMS arrived, SBP low, and she received 500 cc of IVF.   In the [facility] ED, HS Tn 323  2014.0.  EKG NSR, 61 bpm, poor R wave progression in III, V1-V3, nonspecific changes.  TSH elevated at 5.032.  LDL 72.  CT without evidence of PE.  Carotids without hemodynamically significant stenosis.  Echo EF 45-50%. LHC showed nl cors, mildly to moderately reduced LVSF with EF 40% and WMA thought most consistent with Takotsubo CM.   Today, 07/17/21, she returns to clinic and has overall been doing well since her LHC.  Her right radial arteriotomy site has been doing well without any pain, tenderness, or signs of infection.  No chest pain.  She reports some SOB/dyspnea, especially with mask.  She notes feeling like she has to drink extra about each breath.  No reported tachypalpitations.  No presyncope or syncope.  She does report that she is easily fatigued, which also concerns her husband.  He reports that she does not feel like walk lately after breakfast.  She does not quite feel like herself.  While she is low energy, she does feel that her energy has improved slightly since discharge from hospital.  She notes that she will walk into the kitchen, and when she starts to get tired, she usually sits down.    Home Medications   Current Outpatient Medications  Medication Instructions    amoxicillin (AMOXIL) 2,000 mg, Oral, As needed   ascorbic acid (VITAMIN C) 500 mg, Oral, Daily   calcium carbonate (OSCAL) 1500 (600 Ca) MG TABS tablet 600 mg of elemental calcium, Oral, 2 times daily with meals   Calcium Polycarbophil (FIBER-CAPS PO) Oral   COVID-19 mRNA Vac-TriS, Pfizer, (PFIZER-BIONT COVID-19 VAC-TRIS) SUSP injection Intramuscular   dicyclomine (BENTYL) 10 mg, Oral, 4 times daily PRN   estradiol (ESTRACE) 1 mg, Oral, Daily   etodolac (LODINE) 400 mg, Oral, 2 times daily PRN   fexofenadine (ALLEGRA) 180 mg, Oral, Daily   folic acid (FOLVITE) 1 mg, Oral, Daily   Humira Pen 40 mg, Every 14 days   losartan (COZAAR) 50 mg, Oral, Daily   methotrexate (RHEUMATREX) 15 mg, Oral, Weekly   metoprolol succinate (TOPROL-XL) 25 MG 24 hr tablet TAKE 1 TABLET BY MOUTH DAILY   Multiple Vitamin (MULTIVITAMIN) tablet 1 tablet, Oral, Daily   Omega-3 Fatty Acids (OMEGA-3 EPA FISH OIL PO) 340-1,000 mg, Oral, Daily   omeprazole (PRILOSEC) 20 mg, Oral, Daily     Review of Systems    She denies chest pain, palpitations, pnd, orthopnea, n, v, dizziness, syncope, edema, weight gain, or early satiety.  She reports fatigue and not feeling back to herself, though her energy has improved slightly since discharge.  She reports shortness of breath and dyspnea.  She feels as if she has to think about breathing.  All other systems reviewed and are otherwise negative except as noted above.  Physical Exam    VS:  BP 128/70 (BP Location: Left Arm, Patient Position: Sitting, Cuff Size: Normal)   Pulse 72   Ht 5\' 7"  (1.702 m)   Wt 144 lb 8 oz (65.5 kg)   SpO2 98%   BMI 22.63 kg/m  , BMI Body mass index is 22.63 kg/m. GEN: Well nourished, well developed, in no acute distress.  Joined by her husband. HEENT: normal. Neck: Supple, no JVD, carotid bruits, or masses. Cardiac: RRR, no murmurs, rubs, or gallops. No clubbing, cyanosis, edema.  Radial arteriotomy site without any evidence of hematoma or  infection, no tenderness to palpation, no bruit.  Radial pulses 2+ bilaterally.  Respiratory:  Respirations regular and unlabored, clear to auscultation bilaterally. GI: Soft, nontender, nondistended, BS + x 4. MS: no deformity or atrophy. Skin: warm and dry, no rash. Neuro:  Strength and sensation are intact. Psych: Normal affect.  Accessory Clinical Findings    ECG personally reviewed by me today -NSR, 72 bpm, PR interval 140 ms, QTC 411 ms poor R wave progression 3, aVF, V1 through V3, nonspecific changes T wave inversion leads II, 3, aVF, V1 through V6- no acute changes.  VITALS Reviewed today   Temp Readings from Last 3 Encounters:  07/10/21 98.2 F (36.8 C) (  Oral)  02/03/21 98.2 F (36.8 C)  08/05/20 98.8 F (37.1 C) (Oral)   BP Readings from Last 3 Encounters:  07/17/21 128/70  07/10/21 (!) 102/47  02/03/21 133/82   Pulse Readings from Last 3 Encounters:  07/17/21 72  07/10/21 60  02/03/21 64    Wt Readings from Last 3 Encounters:  07/17/21 144 lb 8 oz (65.5 kg)  07/10/21 144 lb (65.3 kg)  02/03/21 144 lb 3.2 oz (65.4 kg)     LABS  reviewed today   Lab Results  Component Value Date   WBC 7.5 07/10/2021   HGB 11.5 (L) 07/10/2021   HCT 32.1 (L) 07/10/2021   MCV 91.2 07/10/2021   PLT 225 07/10/2021   Lab Results  Component Value Date   CREATININE 0.67 07/10/2021   BUN 9 07/10/2021   NA 134 (L) 07/10/2021   K 3.4 (L) 07/10/2021   CL 102 07/10/2021   CO2 24 07/10/2021   Lab Results  Component Value Date   ALT 11 08/05/2020   AST 23 08/05/2020   ALKPHOS 61 08/05/2020   BILITOT 0.4 08/05/2020   Lab Results  Component Value Date   CHOL 155 07/09/2021   HDL 63 07/09/2021   LDLCALC 72 07/09/2021   TRIG 101 07/09/2021   CHOLHDL 2.5 07/09/2021    Lab Results  Component Value Date   HGBA1C 5.6 07/09/2021   Lab Results  Component Value Date   TSH 5.032 (H) 07/09/2021     STUDIES/PROCEDURES reviewed today   LHC 07/10/21   There is mild to  moderate left ventricular systolic dysfunction.   LV end diastolic pressure is mildly elevated. 1.  Normal coronary arteries. 2.  Mildly to moderately reduced LV systolic function with an EF of 40% with wall motion abnormality consistent with Takotsubo cardiomyopathy. Recommendations: Recommend medical therapy with a beta-blocker and an ACE inhibitor or ARB.  Anticipate improvement in ejection fraction in the near future.   Echocardiogram 07/10/2021 1. Left ventricular ejection fraction, by estimation, is 45 to 50%. The  left ventricle has mildly decreased function. Left ventricular endocardial  border not optimally defined to evaluate regional wall motion. There is  mild left ventricular hypertrophy.   Left ventricular diastolic function could not be evaluated.   2. Right ventricular systolic function is normal. The right ventricular  size is normal. Tricuspid regurgitation signal is inadequate for assessing  PA pressure.   3. The mitral valve was not well visualized. No evidence of mitral valve  regurgitation. No evidence of mitral stenosis.   4. The aortic valve was not well visualized. Aortic valve regurgitation  is not visualized. No aortic stenosis is present.   5. very limited study. Only suboptimal parasternal images were available.   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  Echo as above with EF 45 to 50%. LHC as above showed nl Cors, mildly reduced LVSF, EF 40%, WMA consistent with Takotsubo cardiomyopathy.  Right radial arteriotomy site without any evidence of hematoma or bruit.  Euvolemic on exam.  No indication for a standing diuretic at this time.  Continue medical therapy with Toprol and  losartan.  Increase actiivty as tolerated. Anticipate improvement in LVEF in the near future with repeat limited echo to occur in approximately 3 months to reassess EF.  Essential Hypertension --Continue current BB and ARB.   Abnormal TSH /subclinical hypothyroidism -- 9/1 TSH 5.032 with free T4 0.89.  Further work-up or monitoring as recommended per PCP.    Rheumatoid arthritis / medication question --Requests recommendations regarding her arthritis medication from a cardiac standpoint.  Advised that she can continue her current medications and will reach out to pharmacy as well to confirm this recommendation.  Disposition: RTC 3 mo or after repeat limited echo  *Please be aware that the above documentation was completed voice recognition software and may contain dictation errors.      Lennon Alstrom, PA-C 07/17/2021

## 2021-07-17 NOTE — Patient Instructions (Signed)
Medication Instructions:  Please continue your current medications  *If you need a refill on your cardiac medications before your next appointment, please call your pharmacy*   Lab Work: None  Testing/Procedures: ECHO (in 3 months)  Your physician has requested that you have an echocardiogram. Echocardiography is a painless test that uses sound waves to create images of your heart. It provides your doctor with information about the size and shape of your heart and how well your heart's chambers and valves are working. This procedure takes approximately one hour. There are no restrictions for this procedure.  There is a possibility that an IV may need to be started during your test to inject an image enhancing agent. This is done to obtain more optimal pictures of your heart. Therefore we ask that you do at least drink some water prior to coming in to hydrate your veins.    Follow-Up: At [facility], you and your health needs are our priority.  As part of our continuing mission to provide you with exceptional heart care, we have created designated Provider Care Teams.  These Care Teams include your primary Cardiologist (physician) and Advanced Practice Providers (APPs -  Physician Assistants and Nurse Practitioners) who all work together to provide you with the care you need, when you need it.  Your next appointment:   3 month(s)  The format for your next appointment:   In Person  Provider:   You may see Nobie Putnam or one of the following Advanced Practice Providers on your designated Care Team:   Nicolasa Ducking, NP Eula Listen, PA-C Marisue Ivan, PA-C Cadence Elnora, [place]

## 2021-07-19 ENCOUNTER — Encounter: Payer: Self-pay | Admitting: Physician Assistant

## 2021-07-20 ENCOUNTER — Telehealth: Payer: Self-pay | Admitting: Pharmacist Clinician (PhC)/ Clinical Pharmacy Specialist

## 2021-07-20 NOTE — Telephone Encounter (Signed)
Reviewed medications, only interaction was Humira with methotrexate.  She has been on both for some time, rheumatology prescribed both.  Assured her that Humira was fine with her other medications.

## 2021-07-20 NOTE — Telephone Encounter (Signed)
-----   Message from Lennon Alstrom, PA-C sent at 07/19/2021  7:59 PM EDT ----- Regarding: Patient Request Hi there, This pt wanted me to double check with a pharmacist that her newest RA medication Humira was OK to take with her other medications, since she wasn't on it before. She said she would be less anxious about all her medications after getting the A-OK with pharmacy. Just passing this along - thank you!  JV

## 2021-07-22 NOTE — Telephone Encounter (Signed)
Contacted patient to schedule appointment. Patient states she was able to see cardiologist and is okay waiting to see Dr. Laural Benes as scheduled on 07/30/21.

## 2021-07-24 ENCOUNTER — Other Ambulatory Visit: Payer: Self-pay | Admitting: Family Medicine

## 2021-07-24 DIAGNOSIS — Z1231 Encounter for screening mammogram for malignant neoplasm of breast: Secondary | ICD-10-CM

## 2021-07-30 ENCOUNTER — Encounter: Payer: Self-pay | Admitting: Family Medicine

## 2021-07-30 ENCOUNTER — Other Ambulatory Visit: Payer: Self-pay

## 2021-07-30 ENCOUNTER — Ambulatory Visit (INDEPENDENT_AMBULATORY_CARE_PROVIDER_SITE_OTHER): Payer: Medicare Other | Admitting: Family Medicine

## 2021-07-30 VITALS — BP 138/80 | HR 72 | Temp 98.7°F | Resp 16 | Ht 67.0 in | Wt 144.6 lb

## 2021-07-30 DIAGNOSIS — I10 Essential (primary) hypertension: Secondary | ICD-10-CM

## 2021-07-30 DIAGNOSIS — I214 Non-ST elevation (NSTEMI) myocardial infarction: Secondary | ICD-10-CM

## 2021-07-30 DIAGNOSIS — I5181 Takotsubo syndrome: Secondary | ICD-10-CM

## 2021-07-30 NOTE — Progress Notes (Signed)
BP 138/80 (BP Location: Left Arm, Patient Position: Sitting, Cuff Size: Normal)   Pulse 72   Temp 98.7 F (37.1 C) (Oral)   Resp 16   Ht 5\' 7"  (1.702 m)   Wt 144 lb 9.6 oz (65.6 kg)   BMI 22.65 kg/m    Subjective:    Patient ID: , female    DOB: Dec 03, 1945, 75 y.o.   MRN: [id number]  HPI: Barbara Tucker is a 75 y.o. female  Chief Complaint  Patient presents with   Hospitalization Follow-up   Hospital FOLLOW UP Time since discharge: 3 weeks Hospital/facility: [facility] Diagnosis: Stress-induced Takotsubo cardiomyopathy NSTEMI HTN GERD RA Procedures/tests: LHC 07/10/21   There is mild to moderate left ventricular systolic dysfunction.   LV end diastolic pressure is mildly elevated. 1.  Normal coronary arteries. 2.  Mildly to moderately reduced LV systolic function with an EF of 40% with wall motion abnormality consistent with Takotsubo cardiomyopathy. Recommendations: Recommend medical therapy with a beta-blocker and an ACE inhibitor or ARB.  Anticipate improvement in ejection fraction in the near future.   Echocardiogram 07/10/2021 1. Left ventricular ejection fraction, by estimation, is 45 to 50%. The  left ventricle has mildly decreased function. Left ventricular endocardial  border not optimally defined to evaluate regional wall motion. There is  mild left ventricular hypertrophy.   Left ventricular diastolic function could not be evaluated.   2. Right ventricular systolic function is normal. The right ventricular  size is normal. Tricuspid regurgitation signal is inadequate for assessing  PA pressure.   3. The mitral valve was not well visualized. No evidence of mitral valve  regurgitation. No evidence of mitral stenosis.   4. The aortic valve was not well visualized. Aortic valve regurgitation  is not visualized. No aortic stenosis is present.   5. very limited study. Only suboptimal parasternal images were available.    09/09/2021 Carotid  Bilateral 07/09/2021 IMPRESSION: No hemodynamically significant stenosis identified Consultants: Cardiology New medications: None. Continue B-blocker and ARB Discharge instructions: follow up here and with cardiology   Status: better Per Hospitalist: "Initial presentation: 75 year old with a history of HTN, GERD, and RA on chronic immunosuppressive therapy who presented to the ER with a near syncopal spell while at work.  She was giving a 61 at  [facility] and shortly thereafter began to feel short of breath and nauseous.  She vomited.  She became diaphoretic.  She felt that she was about to lose consciousness but did not.  EMS was summoned and found her blood pressure to be 80/40.  This quickly improved with a 500 cc bolus.   Hospital Course: The patient was admitted to the acute units and cardiology was consulted.  CT of the chest revealed no pulmonary embolism but did suggest aortic atherosclerosis.  Carotid ultrasound noted no significant disease.  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  Clinically the patient stabilized quickly after her admission.  Given her markedly elevated cardiac enzymes she was taken to the cardiac Cath Lab 07/10/2021.  Fortunately only normal coronary arteries were noted on cardiac catheterization, and wall motion abnormalities were in a pattern suggestive of Takotsubo stress-induced cardiomyopathy.  The patient recovered quickly and without incident from her cardiac cath.  Her other medical issues remained perfectly stable.  She was cleared for discharge home by cardiology to continue her beta-blocker and ARB."  Since getting  out of the hospital she has been feeling well. She has been over to see her cardiologist and has been doing pretty well. She finds herself to be short of breath a lot. She notes that she can't really get a deep breath- has to feel like she's  doing a lot of catch up. She is feeling mostly better. Still tiring easily and having to sit more. She finds this to be stressful.   BP has been variable. It's been from the 100s/60s- 130s/70s. She has been tolerating her medicine OK. No other concerns or complaints at this time.   Relevant past medical, surgical, family and social history reviewed and updated as indicated. Interim medical history since our last visit reviewed. Allergies and medications reviewed and updated.  Review of Systems  Constitutional:  Positive for fatigue. Negative for activity change, appetite change, chills, diaphoresis, fever and unexpected weight change.  Respiratory:  Positive for shortness of breath. Negative for apnea, cough, choking, chest tightness, wheezing and stridor.   Cardiovascular: Negative.   Gastrointestinal: Negative.   Genitourinary: Negative.   Musculoskeletal: Negative.   Psychiatric/Behavioral: Negative.     Per HPI unless specifically indicated above     Objective:    BP 138/80 (BP Location: Left Arm, Patient Position: Sitting, Cuff Size: Normal)   Pulse 72   Temp 98.7 F (37.1 C) (Oral)   Resp 16   Ht 5\' 7"  (1.702 m)   Wt 144 lb 9.6 oz (65.6 kg)   BMI 22.65 kg/m   Wt Readings from Last 3 Encounters:  07/30/21 144 lb 9.6 oz (65.6 kg)  07/17/21 144 lb 8 oz (65.5 kg)  07/10/21 144 lb (65.3 kg)    Physical Exam Vitals and nursing note reviewed.  Constitutional:      General: She is not in acute distress.    Appearance: Normal appearance. She is not ill-appearing, toxic-appearing or diaphoretic.  HENT:     Head: Normocephalic and atraumatic.     Right Ear: External ear normal.     Left Ear: External ear normal.     Nose: Nose normal.     Mouth/Throat:     Mouth: Mucous membranes are moist.     Pharynx: Oropharynx is clear.  Eyes:     General: No scleral icterus.       Right eye: No discharge.        Left eye: No discharge.     Extraocular Movements: Extraocular  movements intact.     Conjunctiva/sclera: Conjunctivae normal.     Pupils: Pupils are equal, round, and reactive to light.  Cardiovascular:     Rate and Rhythm: Normal rate and regular rhythm.     Pulses: Normal pulses.     Heart sounds: Normal heart sounds. No murmur heard.   No friction rub. No gallop.  Pulmonary:     Effort: Pulmonary effort is normal. No respiratory distress.     Breath sounds: Normal breath sounds. No stridor. No wheezing, rhonchi or rales.  Chest:     Chest wall: No tenderness.  Musculoskeletal:        General: Normal range of motion.     Cervical back: Normal range of motion and neck supple.  Skin:    General: Skin is warm and dry.     Capillary Refill: Capillary refill takes less than 2 seconds.     Coloration: Skin is not jaundiced or pale.     Findings: No bruising, erythema, lesion or rash.  Neurological:  General: No focal deficit present.     Mental Status: She is alert and oriented to person, place, and time. Mental status is at baseline.  Psychiatric:        Mood and Affect: Mood normal.        Behavior: Behavior normal.        Thought Content: Thought content normal.        Judgment: Judgment normal.    Results for orders placed or performed during the hospital encounter of 07/09/21  Resp Panel by RT-PCR (Flu A&B, Covid) Nasopharyngeal Swab   Specimen: Nasopharyngeal Swab; Nasopharyngeal(NP) swabs in vial transport medium  Result Value Ref Range   SARS Coronavirus 2 by RT PCR NEGATIVE NEGATIVE   Influenza A by PCR NEGATIVE NEGATIVE   Influenza B by PCR NEGATIVE NEGATIVE  Basic metabolic panel  Result Value Ref Range   Sodium 135 135 - 145 mmol/L   Potassium 4.0 3.5 - 5.1 mmol/L   Chloride 101 98 - 111 mmol/L   CO2 28 22 - 32 mmol/L   Glucose, Bld 121 (H) 70 - 99 mg/dL   BUN 12 8 - 23 mg/dL   Creatinine, Ser 0.16 0.44 - 1.00 mg/dL   Calcium 9.1 8.9 - 01.0 mg/dL   GFR, Estimated >93 >23 mL/min   Anion gap 6 5 - 15  CBC  Result Value  Ref Range   WBC 8.7 4.0 - 10.5 K/uL   RBC 4.00 3.87 - 5.11 MIL/uL   Hemoglobin 12.8 12.0 - 15.0 g/dL   HCT 55.7 32.2 - 02.5 %   MCV 92.3 80.0 - 100.0 fL   MCH 32.0 26.0 - 34.0 pg   MCHC 34.7 30.0 - 36.0 g/dL   RDW 42.7 06.2 - 37.6 %   Platelets 244 150 - 400 K/uL   nRBC 0.0 0.0 - 0.2 %  Heparin level (unfractionated)  Result Value Ref Range   Heparin Unfractionated 0.35 0.30 - 0.70 IU/mL  APTT  Result Value Ref Range   aPTT 24 24 - 36 seconds  Protime-INR  Result Value Ref Range   Prothrombin Time 13.7 11.4 - 15.2 seconds   INR 1.1 0.8 - 1.2  Hemoglobin A1c  Result Value Ref Range   Hgb A1c MFr Bld 5.6 4.8 - 5.6 %   Mean Plasma Glucose 114.02 mg/dL  TSH  Result Value Ref Range   TSH 5.032 (H) 0.350 - 4.500 uIU/mL  Lipid panel  Result Value Ref Range   Cholesterol 155 0 - 200 mg/dL   Triglycerides 283 <151 mg/dL   HDL 63 >76 mg/dL   Total CHOL/HDL Ratio 2.5 RATIO   VLDL 20 0 - 40 mg/dL   LDL Cholesterol 72 0 - 99 mg/dL  T4, free  Result Value Ref Range   Free T4 0.89 0.61 - 1.12 ng/dL  CBC  Result Value Ref Range   WBC 7.5 4.0 - 10.5 K/uL   RBC 3.52 (L) 3.87 - 5.11 MIL/uL   Hemoglobin 11.5 (L) 12.0 - 15.0 g/dL   HCT 16.0 (L) 73.7 - 10.6 %   MCV 91.2 80.0 - 100.0 fL   MCH 32.7 26.0 - 34.0 pg   MCHC 35.8 30.0 - 36.0 g/dL   RDW 26.9 48.5 - 46.2 %   Platelets 225 150 - 400 K/uL   nRBC 0.0 0.0 - 0.2 %  Heparin level (unfractionated)  Result Value Ref Range   Heparin Unfractionated 0.13 (L) 0.30 - 0.70 IU/mL  Basic metabolic panel  Result  Value Ref Range   Sodium 134 (L) 135 - 145 mmol/L   Potassium 3.4 (L) 3.5 - 5.1 mmol/L   Chloride 102 98 - 111 mmol/L   CO2 24 22 - 32 mmol/L   Glucose, Bld 130 (H) 70 - 99 mg/dL   BUN 9 8 - 23 mg/dL   Creatinine, Ser 6.65 0.44 - 1.00 mg/dL   Calcium 8.8 (L) 8.9 - 10.3 mg/dL   GFR, Estimated >99 >35 mL/min   Anion gap 8 5 - 15  ECHOCARDIOGRAM COMPLETE  Result Value Ref Range   Weight 2,304 oz   Height 67 in   BP 115/59  mmHg   S' Lateral 3.60 cm  CARDIAC CATHETERIZATION  Result Value Ref Range   Cath EF Quantitative 40 %  Troponin I (High Sensitivity)  Result Value Ref Range   Troponin I (High Sensitivity) 323 (HH) <18 ng/L  Troponin I (High Sensitivity)  Result Value Ref Range   Troponin I (High Sensitivity) 2,014 (HH) <18 ng/L  Troponin I (High Sensitivity)  Result Value Ref Range   Troponin I (High Sensitivity) 4,423 (HH) <18 ng/L  Troponin I (High Sensitivity)  Result Value Ref Range   Troponin I (High Sensitivity) 4,091 (HH) <18 ng/L      Assessment & Plan:   Problem List Items Addressed This Visit       Cardiovascular and Mediastinum   Hypertension    Under good control on current regimen. Continue current regimen. Continue to monitor. Call with any concerns. Refills given.        NSTEMI (non-ST elevated myocardial infarction) (HCC)    Stable. Will continue to keep BP and cholesterol under good control. Continue to follow with cardiology. Call with any concerns.       Takotsubo cardiomyopathy - Primary    Stable. Will continue to keep BP and cholesterol under good control. Continue to follow with cardiology. Call with any concerns.         Follow up plan: Return As scheduled.   >30 minutes spent with patient today

## 2021-08-03 NOTE — Assessment & Plan Note (Signed)
Under good control on current regimen. Continue current regimen. Continue to monitor. Call with any concerns. Refills given.   

## 2021-08-03 NOTE — Assessment & Plan Note (Signed)
Stable. Will continue to keep BP and cholesterol under good control. Continue to follow with cardiology. Call with any concerns.  

## 2021-08-03 NOTE — Assessment & Plan Note (Signed)
Stable. Will continue to keep BP and cholesterol under good control. Continue to follow with cardiology. Call with any concerns.

## 2021-08-11 ENCOUNTER — Encounter: Payer: Self-pay | Admitting: Family Medicine

## 2021-08-11 ENCOUNTER — Other Ambulatory Visit: Payer: Self-pay

## 2021-08-11 ENCOUNTER — Ambulatory Visit (INDEPENDENT_AMBULATORY_CARE_PROVIDER_SITE_OTHER): Payer: Medicare Other | Admitting: Family Medicine

## 2021-08-11 VITALS — BP 115/75 | HR 66 | Ht 67.0 in | Wt 144.0 lb

## 2021-08-11 DIAGNOSIS — Z23 Encounter for immunization: Secondary | ICD-10-CM

## 2021-08-11 DIAGNOSIS — M059 Rheumatoid arthritis with rheumatoid factor, unspecified: Secondary | ICD-10-CM

## 2021-08-11 DIAGNOSIS — I10 Essential (primary) hypertension: Secondary | ICD-10-CM | POA: Diagnosis not present

## 2021-08-11 DIAGNOSIS — Z136 Encounter for screening for cardiovascular disorders: Secondary | ICD-10-CM

## 2021-08-11 DIAGNOSIS — M8580 Other specified disorders of bone density and structure, unspecified site: Secondary | ICD-10-CM | POA: Diagnosis not present

## 2021-08-11 DIAGNOSIS — Z0000 Encounter for general adult medical examination without abnormal findings: Secondary | ICD-10-CM

## 2021-08-11 DIAGNOSIS — E038 Other specified hypothyroidism: Secondary | ICD-10-CM

## 2021-08-11 LAB — URINALYSIS, ROUTINE W REFLEX MICROSCOPIC
Bilirubin, UA: NEGATIVE
Glucose, UA: NEGATIVE
Ketones, UA: NEGATIVE
Leukocytes,UA: NEGATIVE
Nitrite, UA: NEGATIVE
Protein,UA: NEGATIVE
RBC, UA: NEGATIVE
Specific Gravity, UA: 1.015 (ref 1.005–1.030)
Urobilinogen, Ur: 0.2 mg/dL (ref 0.2–1.0)
pH, UA: 7.5 (ref 5.0–7.5)

## 2021-08-11 LAB — MICROALBUMIN, URINE WAIVED
Creatinine, Urine Waived: 50 mg/dL (ref 10–300)
Microalb, Ur Waived: 10 mg/L (ref 0–19)
Microalb/Creat Ratio: <30 mg/g (ref <30)

## 2021-08-11 MED ORDER — LOSARTAN POTASSIUM 50 MG PO TABS: 50.0000 mg | ORAL_TABLET | Freq: Every day | ORAL | 1 refills | Status: DC

## 2021-08-11 MED ORDER — ESTRADIOL 1 MG PO TABS: 1.0000 mg | ORAL_TABLET | Freq: Every day | ORAL | 1 refills | Status: DC

## 2021-08-11 MED ORDER — METOPROLOL SUCCINATE ER 25 MG PO TB24: 25.0000 mg | ORAL_TABLET | Freq: Every day | ORAL | 1 refills | Status: DC

## 2021-08-11 NOTE — Progress Notes (Signed)
BP 115/75   Pulse 66   Ht '5\' 7"'  (1.702 m)   Wt 144 lb (65.3 kg)   BMI 22.55 kg/m    Subjective:    Patient ID: Barbara Tucker, female    DOB: 06-13-1946, 75 y.o.   MRN: [id number]  HPI: Barbara Tucker is a 76 y.o. female presenting on 08/11/2021 for comprehensive medical examination. Current medical complaints include:  HYPERTENSION Hypertension status: controlled  Satisfied with current treatment? yes Duration of hypertension: chronic BP monitoring frequency:  not checking BP medication side effects:  no Medication compliance: excellent compliance Previous BP meds:metoprolol and losartan Aspirin: no Recurrent headaches: no Visual changes: no Palpitations: no Dyspnea: no Chest pain: no Lower extremity edema: no Dizzy/lightheaded: no  SUBCLINICAL HYPOTHYROIDISM Thyroid control status:stable Satisfied with current treatment? yes Medication side effects: not on anything Fatigue: no Cold intolerance: no Heat intolerance: no Weight gain: no Weight loss: no Constipation: no Diarrhea/loose stools: no Palpitations: no Lower extremity edema: no Anxiety/depressed mood: no  She currently lives with: husband Menopausal Symptoms: no  Depression Screen done today and results listed below:  Depression screen [facility] 2/9 08/15/2020 08/05/2020 07/27/2019 07/25/2018 05/25/2018  Decreased Interest 0 0 0 1 1  Down, Depressed, Hopeless 0 0 1 1 0  PHQ - 2 Score 0 0 '1 2 1  ' Altered sleeping - - 0 0 -  Tired, decreased energy - - 1 1 -  Change in appetite - - 0 0 -  Feeling bad or failure about yourself  - - 0 0 -  Trouble concentrating - - 1 0 -  Moving slowly or fidgety/restless - - 0 0 -  Suicidal thoughts - - 0 0 -  PHQ-9 Score - - 3 3 -  Difficult doing work/chores - - Not difficult at all Not difficult at all -    Past Medical History:  Past Medical History:  Diagnosis Date   Allergic rhinitis    Allergy    Endometriosis    Heart murmur 1975   minimal, seen  on ultrasound long ago   Hypertension    Hypothyroidism    IBS (irritable bowel syndrome)    Migraine    Osteoarthritis    Rheumatoid arthritis ([place])     Surgical History:  Past Surgical History:  Procedure Laterality Date   ABDOMINAL HYSTERECTOMY     APPENDECTOMY     BREAST EXCISIONAL BIOPSY Right 1975   excisional - negative   BREAST SURGERY  1975?   benign cyst removed   COLONOSCOPY WITH PROPOFOL N/A 02/03/2016   Procedure: COLONOSCOPY WITH PROPOFOL;  Surgeon: Lollie Sails, MD;  Location: [facility] ENDOSCOPY;  Service: Endoscopy;  Laterality: N/A;   LEFT HEART CATH AND CORONARY ANGIOGRAPHY N/A 07/10/2021   Procedure: LEFT HEART CATH AND CORONARY ANGIOGRAPHY;  Surgeon: Wellington Hampshire, MD;  Location: [facility];  Service: Cardiovascular;  Laterality: N/A;   OOPHORECTOMY Bilateral    TONSILLECTOMY     TUBAL LIGATION      Medications:  Current Outpatient Medications on File Prior to Visit  Medication Sig   ascorbic acid (VITAMIN C) 500 MG tablet Take 500 mg by mouth daily.   calcium carbonate (OSCAL) 1500 (600 Ca) MG TABS tablet Take 600 mg of elemental calcium by mouth 2 (two) times daily with a meal.   Calcium Polycarbophil (FIBER-CAPS PO) Take by mouth.   dicyclomine (BENTYL) 10 MG capsule Take 1 capsule (10 mg total) by mouth 4 (four) times daily as  needed for spasms.   etodolac (LODINE) 400 MG tablet Take 1 tablet (400 mg total) by mouth 2 (two) times daily as needed.   fexofenadine (ALLEGRA) 180 MG tablet Take 180 mg by mouth daily.   folic acid (FOLVITE) 1 MG tablet Take 1 mg by mouth daily.   HUMIRA PEN 40 MG/0.4ML PNKT Inject 40 mg into the muscle every 14 (fourteen) days.   methotrexate 2.5 MG tablet Take 20 mg by mouth once a week. Caution:Chemotherapy. Protect from light.   Multiple Vitamin (MULTIVITAMIN) tablet Take 1 tablet by mouth daily.   Omega-3 Fatty Acids (OMEGA-3 EPA FISH OIL PO) Take 340-1,000 mg by mouth daily.   omeprazole (PRILOSEC) 20 MG  capsule Take 20 mg by mouth daily.   No current facility-administered medications on file prior to visit.    Allergies:  Allergies  Allergen Reactions   Codeine    Lisinopril Other (See Comments)    Migraine   Sulfa Antibiotics     Social History:  Social History   Socioeconomic History   Marital status: Married    Spouse name: Not on file   Number of children: Not on file   Years of education: Not on file   Highest education level: Not on file  Occupational History   Occupation: retired  Tobacco Use   Smoking status: Never   Smokeless tobacco: Never   Tobacco comments:    never smoked, around lots of second hand  Vaping Use   Vaping Use: Never used  Substance and Sexual Activity   Alcohol use: Not Currently   Drug use: No   Sexual activity: Not Currently  Other Topics Concern   Not on file  Social History Narrative   Not on file   Social Determinants of Health   Financial Resource Strain: Low Risk    Difficulty of Paying Living Expenses: Not hard at all  Food Insecurity: No Food Insecurity   Worried About Charity fundraiser in the Last Year: Never true   [place] in the Last Year: Never true  Transportation Needs: No Transportation Needs   Lack of Transportation (Medical): No   Lack of Transportation (Non-Medical): No  Physical Activity: Sufficiently Active   Days of Exercise per Week: 5 days   Minutes of Exercise per Session: 30 min  Stress: No Stress Concern Present   Feeling of Stress : Not at all  Social Connections: Not on file  Intimate Partner Violence: Not on file   Social History   Tobacco Use  Smoking Status Never  Smokeless Tobacco Never  Tobacco Comments   never smoked, around lots of second hand   Social History   Substance and Sexual Activity  Alcohol Use Not Currently    Family History:  Family History  Problem Relation Age of Onset   Breast cancer Paternal Grandmother    Cancer Paternal Grandmother        Breast    Heart disease Paternal Grandmother    Stroke Mother    Arthritis Mother        Osteo and rheumatoid   Hypertension Mother    Angina Mother    Vision loss Mother    Cancer Father        Multiple Myeloma   Gout Father    Cancer Sister        Breast   Heart disease Sister    Cancer Sister        Skin   Cancer Brother  prostate    Past medical history, surgical history, medications, allergies, family history and social history reviewed with patient today and changes made to appropriate areas of the chart.   Review of Systems  Constitutional: Negative.   HENT: Negative.    Eyes: Negative.   Respiratory:  Positive for shortness of breath (Occasional SOB- improving). Negative for cough, hemoptysis, sputum production and wheezing.   Cardiovascular:  Positive for palpitations (very occasionally). Negative for chest pain, orthopnea, claudication, leg swelling and PND.  Gastrointestinal: Negative.   Genitourinary: Negative.   Musculoskeletal:  Positive for joint pain, myalgias and neck pain. Negative for back pain and falls.  Skin: Negative.   Neurological: Negative.   Endo/Heme/Allergies: Negative.   Psychiatric/Behavioral: Negative.    All other ROS negative except what is listed above and in the HPI.      Objective:    BP 115/75   Pulse 66   Ht '5\' 7"'  (1.702 m)   Wt 144 lb (65.3 kg)   BMI 22.55 kg/m   Wt Readings from Last 3 Encounters:  08/11/21 144 lb (65.3 kg)  07/30/21 144 lb 9.6 oz (65.6 kg)  07/17/21 144 lb 8 oz (65.5 kg)    Physical Exam Vitals and nursing note reviewed.  Constitutional:      General: She is not in acute distress.    Appearance: Normal appearance. She is not ill-appearing, toxic-appearing or diaphoretic.  HENT:     Head: Normocephalic and atraumatic.     Right Ear: Tympanic membrane, ear canal and external ear normal. There is no impacted cerumen.     Left Ear: Tympanic membrane, ear canal and external ear normal. There is no impacted  cerumen.     Nose: Nose normal. No congestion or rhinorrhea.     Mouth/Throat:     Mouth: Mucous membranes are moist.     Pharynx: Oropharynx is clear. No oropharyngeal exudate or posterior oropharyngeal erythema.  Eyes:     General: No scleral icterus.       Right eye: No discharge.        Left eye: No discharge.     Extraocular Movements: Extraocular movements intact.     Conjunctiva/sclera: Conjunctivae normal.     Pupils: Pupils are equal, round, and reactive to light.  Neck:     Vascular: No carotid bruit.  Cardiovascular:     Rate and Rhythm: Normal rate and regular rhythm.     Pulses: Normal pulses.     Heart sounds: No murmur heard.   No friction rub. No gallop.  Pulmonary:     Effort: Pulmonary effort is normal. No respiratory distress.     Breath sounds: Normal breath sounds. No stridor. No wheezing, rhonchi or rales.  Chest:     Chest wall: No tenderness.  Abdominal:     General: Abdomen is flat. Bowel sounds are normal. There is no distension.     Palpations: Abdomen is soft. There is no mass.     Tenderness: There is no abdominal tenderness. There is no right CVA tenderness, left CVA tenderness, guarding or rebound.     Hernia: No hernia is present.  Genitourinary:    Comments: Breast and pelvic exams deferred with shared decision making Musculoskeletal:        General: No swelling, tenderness, deformity or signs of injury.     Cervical back: Normal range of motion and neck supple. No rigidity. No muscular tenderness.     Right lower leg: No edema.  Left lower leg: No edema.  Lymphadenopathy:     Cervical: No cervical adenopathy.  Skin:    General: Skin is warm and dry.     Capillary Refill: Capillary refill takes less than 2 seconds.     Coloration: Skin is not jaundiced or pale.     Findings: No bruising, erythema, lesion or rash.  Neurological:     General: No focal deficit present.     Mental Status: She is alert and oriented to person, place, and  time. Mental status is at baseline.     Cranial Nerves: No cranial nerve deficit.     Sensory: No sensory deficit.     Motor: No weakness.     Coordination: Coordination normal.     Gait: Gait normal.     Deep Tendon Reflexes: Reflexes normal.  Psychiatric:        Mood and Affect: Mood normal.        Behavior: Behavior normal.        Thought Content: Thought content normal.        Judgment: Judgment normal.    Results for orders placed or performed during the hospital encounter of 07/09/21  Resp Panel by RT-PCR (Flu A&B, Covid) Nasopharyngeal Swab   Specimen: Nasopharyngeal Swab; Nasopharyngeal(NP) swabs in vial transport medium  Result Value Ref Range   SARS Coronavirus 2 by RT PCR NEGATIVE NEGATIVE   Influenza A by PCR NEGATIVE NEGATIVE   Influenza B by PCR NEGATIVE NEGATIVE  Basic metabolic panel  Result Value Ref Range   Sodium 135 135 - 145 mmol/L   Potassium 4.0 3.5 - 5.1 mmol/L   Chloride 101 98 - 111 mmol/L   CO2 28 22 - 32 mmol/L   Glucose, Bld 121 (H) 70 - 99 mg/dL   BUN 12 8 - 23 mg/dL   Creatinine, Ser 0.75 0.44 - 1.00 mg/dL   Calcium 9.1 8.9 - 10.3 mg/dL   GFR, Estimated >60 >60 mL/min   Anion gap 6 5 - 15  CBC  Result Value Ref Range   WBC 8.7 4.0 - 10.5 K/uL   RBC 4.00 3.87 - 5.11 MIL/uL   Hemoglobin 12.8 12.0 - 15.0 g/dL   HCT 36.9 36.0 - 46.0 %   MCV 92.3 80.0 - 100.0 fL   MCH 32.0 26.0 - 34.0 pg   MCHC 34.7 30.0 - 36.0 g/dL   RDW 13.0 11.5 - 15.5 %   Platelets 244 150 - 400 K/uL   nRBC 0.0 0.0 - 0.2 %  Heparin level (unfractionated)  Result Value Ref Range   Heparin Unfractionated 0.35 0.30 - 0.70 IU/mL  APTT  Result Value Ref Range   aPTT 24 24 - 36 seconds  Protime-INR  Result Value Ref Range   Prothrombin Time 13.7 11.4 - 15.2 seconds   INR 1.1 0.8 - 1.2  Hemoglobin A1c  Result Value Ref Range   Hgb A1c MFr Bld 5.6 4.8 - 5.6 %   Mean Plasma Glucose 114.02 mg/dL  TSH  Result Value Ref Range   TSH 5.032 (H) 0.350 - 4.500 uIU/mL  Lipid  panel  Result Value Ref Range   Cholesterol 155 0 - 200 mg/dL   Triglycerides 101 <150 mg/dL   HDL 63 >40 mg/dL   Total CHOL/HDL Ratio 2.5 RATIO   VLDL 20 0 - 40 mg/dL   LDL Cholesterol 72 0 - 99 mg/dL  T4, free  Result Value Ref Range   Free T4 0.89 0.61 - 1.12  ng/dL  CBC  Result Value Ref Range   WBC 7.5 4.0 - 10.5 K/uL   RBC 3.52 (L) 3.87 - 5.11 MIL/uL   Hemoglobin 11.5 (L) 12.0 - 15.0 g/dL   HCT 32.1 (L) 36.0 - 46.0 %   MCV 91.2 80.0 - 100.0 fL   MCH 32.7 26.0 - 34.0 pg   MCHC 35.8 30.0 - 36.0 g/dL   RDW 13.2 11.5 - 15.5 %   Platelets 225 150 - 400 K/uL   nRBC 0.0 0.0 - 0.2 %  Heparin level (unfractionated)  Result Value Ref Range   Heparin Unfractionated 0.13 (L) 0.30 - 0.70 IU/mL  Basic metabolic panel  Result Value Ref Range   Sodium 134 (L) 135 - 145 mmol/L   Potassium 3.4 (L) 3.5 - 5.1 mmol/L   Chloride 102 98 - 111 mmol/L   CO2 24 22 - 32 mmol/L   Glucose, Bld 130 (H) 70 - 99 mg/dL   BUN 9 8 - 23 mg/dL   Creatinine, Ser 0.67 0.44 - 1.00 mg/dL   Calcium 8.8 (L) 8.9 - 10.3 mg/dL   GFR, Estimated >60 >60 mL/min   Anion gap 8 5 - 15  ECHOCARDIOGRAM COMPLETE  Result Value Ref Range   Weight 2,304 oz   Height 67 in   BP 115/59 mmHg   S' Lateral 3.60 cm  CARDIAC CATHETERIZATION  Result Value Ref Range   Cath EF Quantitative 40 %  Troponin I (High Sensitivity)  Result Value Ref Range   Troponin I (High Sensitivity) 323 (HH) <18 ng/L  Troponin I (High Sensitivity)  Result Value Ref Range   Troponin I (High Sensitivity) 2,014 (HH) <18 ng/L  Troponin I (High Sensitivity)  Result Value Ref Range   Troponin I (High Sensitivity) 4,423 (HH) <18 ng/L  Troponin I (High Sensitivity)  Result Value Ref Range   Troponin I (High Sensitivity) 4,091 (HH) <18 ng/L      Assessment & Plan:   Problem List Items Addressed This Visit       Cardiovascular and Mediastinum   Hypertension    Under good control on current regimen. Continue current regimen. Continue to  monitor. Call with any concerns. Refills given. Labs drawn today.       Relevant Medications   metoprolol succinate (TOPROL-XL) 25 MG 24 hr tablet   losartan (COZAAR) 50 MG tablet   Other Relevant Orders   CBC with Differential/Platelet   Comprehensive metabolic panel   Urinalysis, Routine w reflex microscopic   Microalbumin, Urine Waived     Endocrine   Subclinical hypothyroidism    Labs drawn today. Await results.       Relevant Medications   metoprolol succinate (TOPROL-XL) 25 MG 24 hr tablet   Other Relevant Orders   Comprehensive metabolic panel   TSH     Musculoskeletal and Integument   Rheumatoid arthritis ([place])    Continue to follow with rheumatology. Call with ay concerns. Continue to monitor.       Relevant Medications   methotrexate 2.5 MG tablet   Osteopenia    Labs drawn today. Await results.       Relevant Orders   CBC with Differential/Platelet   Comprehensive metabolic panel   VITAMIN D 25 Hydroxy (Vit-D Deficiency, Fractures)   Other Visit Diagnoses     Routine general medical examination at a health care facility    -  Primary   Vaccines up to date. Screening labs checked today. Mammo ordered. Colonoscopy and DEXA up  to date. Continue diet and exercise. Call with any concerns.    Screening for cardiovascular condition       Labs drawn today. Await results.    Relevant Orders   Lipid Panel w/o Chol/HDL Ratio   Need for immunization against influenza       Flu shot given today.   Relevant Orders   Flu Vaccine QUAD High Dose(Fluad) (Completed)        Follow up plan: Return in about 6 months (around 02/09/2022).   LABORATORY TESTING:  - Pap smear: not applicable  IMMUNIZATIONS:   - Tdap: Tetanus vaccination status reviewed: last tetanus booster within 10 years. - Influenza: Administered today - Pneumovax: Up to date - Prevnar: Up to date - COVID: Up to date - Shingrix vaccine: Up to date  SCREENING: -Mammogram: Ordered today  -  Colonoscopy: Not applicable  - Bone Density: Up to date   PATIENT COUNSELING:   Advised to take 1 mg of folate supplement per day if capable of pregnancy.   Sexuality: Discussed sexually transmitted diseases, partner selection, use of condoms, avoidance of unintended pregnancy  and contraceptive alternatives.   Advised to avoid cigarette smoking.  I discussed with the patient that most people either abstain from alcohol or drink within safe limits (<=14/week and <=4 drinks/occasion for males, <=7/weeks and <= 3 drinks/occasion for females) and that the risk for alcohol disorders and other health effects rises proportionally with the number of drinks per week and how often a drinker exceeds daily limits.  Discussed cessation/primary prevention of drug use and availability of treatment for abuse.   Diet: Encouraged to adjust caloric intake to maintain  or achieve ideal body weight, to reduce intake of dietary saturated fat and total fat, to limit sodium intake by avoiding high sodium foods and not adding table salt, and to maintain adequate dietary potassium and calcium preferably from fresh fruits, vegetables, and low-fat dairy products.    stressed the importance of regular exercise  Injury prevention: Discussed safety belts, safety helmets, smoke detector, smoking near bedding or upholstery.   Dental health: Discussed importance of regular tooth brushing, flossing, and dental visits.    NEXT PREVENTATIVE PHYSICAL DUE IN 1 YEAR. Return in about 6 months (around 02/09/2022).

## 2021-08-11 NOTE — Assessment & Plan Note (Signed)
Under good control on current regimen. Continue current regimen. Continue to monitor. Call with any concerns. Refills given. Labs drawn today.   

## 2021-08-11 NOTE — Assessment & Plan Note (Signed)
Continue to follow with rheumatology. Call with ay concerns. Continue to monitor.

## 2021-08-11 NOTE — Assessment & Plan Note (Signed)
Labs drawn today. Await results.  

## 2021-08-12 LAB — CBC WITH DIFFERENTIAL/PLATELET
Basophils Absolute: 0 10*3/uL (ref 0.0–0.2)
Basos: 1 %
EOS (ABSOLUTE): 0.1 10*3/uL (ref 0.0–0.4)
Eos: 2 %
Hematocrit: 41.2 % (ref 34.0–46.6)
Hemoglobin: 13.3 g/dL (ref 11.1–15.9)
Immature Grans (Abs): 0 10*3/uL (ref 0.0–0.1)
Immature Granulocytes: 0 %
Lymphocytes Absolute: 2.9 10*3/uL (ref 0.7–3.1)
Lymphs: 44 %
MCH: 30.8 pg (ref 26.6–33.0)
MCHC: 32.3 g/dL (ref 31.5–35.7)
MCV: 95 fL (ref 79–97)
Monocytes Absolute: 0.6 10*3/uL (ref 0.1–0.9)
Monocytes: 10 %
Neutrophils Absolute: 2.8 10*3/uL (ref 1.4–7.0)
Neutrophils: 43 %
Platelets: 247 10*3/uL (ref 150–450)
RBC: 4.32 x10E6/uL (ref 3.77–5.28)
RDW: 12.9 % (ref 11.7–15.4)
WBC: 6.6 10*3/uL (ref 3.4–10.8)

## 2021-08-12 LAB — COMPREHENSIVE METABOLIC PANEL
ALT: 21 IU/L (ref 0–32)
AST: 23 IU/L (ref 0–40)
Albumin/Globulin Ratio: 2 (ref 1.2–2.2)
Albumin: 4.5 g/dL (ref 3.7–4.7)
Alkaline Phosphatase: 65 IU/L (ref 44–121)
BUN/Creatinine Ratio: 15 (ref 12–28)
BUN: 10 mg/dL (ref 8–27)
Bilirubin Total: 0.5 mg/dL (ref 0.0–1.2)
CO2: 25 mmol/L (ref 20–29)
Calcium: 9.7 mg/dL (ref 8.7–10.3)
Chloride: 100 mmol/L (ref 96–106)
Creatinine, Ser: 0.67 mg/dL (ref 0.57–1.00)
Globulin, Total: 2.2 g/dL (ref 1.5–4.5)
Glucose: 92 mg/dL (ref 70–99)
Potassium: 4 mmol/L (ref 3.5–5.2)
Sodium: 140 mmol/L (ref 134–144)
Total Protein: 6.7 g/dL (ref 6.0–8.5)
eGFR: 91 mL/min/{1.73_m2} (ref >59)

## 2021-08-12 LAB — LIPID PANEL W/O CHOL/HDL RATIO
Cholesterol, Total: 177 mg/dL (ref 100–199)
HDL: 76 mg/dL (ref >39)
LDL Chol Calc (NIH): 78 mg/dL (ref 0–99)
Triglycerides: 135 mg/dL (ref 0–149)
VLDL Cholesterol Cal: 23 mg/dL (ref 5–40)

## 2021-08-12 LAB — TSH: TSH: 3.27 u[IU]/mL (ref 0.450–4.500)

## 2021-08-12 LAB — VITAMIN D 25 HYDROXY (VIT D DEFICIENCY, FRACTURES): Vit D, 25-Hydroxy: 34.8 ng/mL (ref 30.0–100.0)

## 2021-08-17 ENCOUNTER — Ambulatory Visit: Payer: Medicare Other

## 2021-08-21 ENCOUNTER — Ambulatory Visit: Payer: Medicare Other

## 2021-08-25 ENCOUNTER — Other Ambulatory Visit: Payer: Self-pay

## 2021-08-25 ENCOUNTER — Ambulatory Visit
Admission: RE | Admit: 2021-08-25 | Discharge: 2021-08-25 | Disposition: A | Discharge status: Home / Self Care | Payer: Medicare Other | Source: Ambulatory Visit | Attending: Family Medicine | Admitting: Family Medicine

## 2021-08-25 DIAGNOSIS — Z1231 Encounter for screening mammogram for malignant neoplasm of breast: Secondary | ICD-10-CM | POA: Diagnosis present

## 2021-08-28 ENCOUNTER — Ambulatory Visit (INDEPENDENT_AMBULATORY_CARE_PROVIDER_SITE_OTHER): Payer: Medicare Other

## 2021-08-28 VITALS — Ht 67.5 in | Wt 145.0 lb

## 2021-08-28 DIAGNOSIS — Z0000 Encounter for general adult medical examination without abnormal findings: Secondary | ICD-10-CM | POA: Diagnosis not present

## 2021-08-28 NOTE — Progress Notes (Signed)
I connected with Barbara Tucker today by telephone and verified that I am speaking with the correct person using two identifiers. Location patient: home Location provider: work Persons participating in the virtual visit: Barbara Tucker, Glenna Durand LPN.   I discussed the limitations, risks, security and privacy concerns of performing an evaluation and management service by telephone and the availability of in person appointments. I also discussed with the patient that there may be a patient responsible charge related to this service. The patient expressed understanding and verbally consented to this telephonic visit.    Interactive audio and video telecommunications were attempted between this provider and patient, however failed, due to patient having technical difficulties OR patient did not have access to video capability.  We continued and completed visit with audio only.     Vital signs may be patient reported or missing.  Subjective:   Barbara Tucker is a 75 y.o. female who presents for Medicare Annual (Subsequent) preventive examination.  Review of Systems     Cardiac Risk Factors include: advanced age (>86mn, >>22women);hypertension     Objective:    Today's Vitals   08/28/21 1426  Weight: 145 lb (65.8 kg)  Height: 5' 7.5" (1.715 m)   Body mass index is 22.38 kg/m.  Advanced Directives 08/28/2021 07/09/2021 07/09/2021 08/15/2020 09/08/2016 02/03/2016  Does Patient Have a Medical Advance Directive? Yes Yes Yes Yes Yes Yes  Type of AParamedicof ASilveradoLiving will HSuringLiving will HAllenLiving will HDecorahLiving will Healthcare Power of ADarwin Does patient want to make changes to medical advance directive? - No - Patient declined - - - -  Copy of [place] Chart? Yes - validated most recent copy scanned in chart (See row  information) No - copy requested - No - copy requested - No - copy requested    Current Medications (verified) Outpatient Encounter Medications as of 08/28/2021  Medication Sig   ascorbic acid (VITAMIN C) 500 MG tablet Take 500 mg by mouth daily.   calcium carbonate (OSCAL) 1500 (600 Ca) MG TABS tablet Take 600 mg of elemental calcium by mouth 2 (two) times daily with a meal.   Calcium Polycarbophil (FIBER-CAPS PO) Take by mouth.   dicyclomine (BENTYL) 10 MG capsule Take 1 capsule (10 mg total) by mouth 4 (four) times daily as needed for spasms.   estradiol (ESTRACE) 1 MG tablet Take 1 tablet (1 mg total) by mouth daily.   etodolac (LODINE) 400 MG tablet Take 1 tablet (400 mg total) by mouth 2 (two) times daily as needed.   fexofenadine (ALLEGRA) 180 MG tablet Take 180 mg by mouth daily.   folic acid (FOLVITE) 1 MG tablet Take 1 mg by mouth daily.   HUMIRA PEN 40 MG/0.4ML PNKT Inject 40 mg into the muscle every 14 (fourteen) days.   losartan (COZAAR) 50 MG tablet Take 1 tablet (50 mg total) by mouth daily.   methotrexate 2.5 MG tablet Take 20 mg by mouth once a week. Caution:Chemotherapy. Protect from light.   metoprolol succinate (TOPROL-XL) 25 MG 24 hr tablet Take 1 tablet (25 mg total) by mouth daily.   Multiple Vitamin (MULTIVITAMIN) tablet Take 1 tablet by mouth daily.   Omega-3 Fatty Acids (OMEGA-3 EPA FISH OIL PO) Take 340-1,000 mg by mouth daily.   omeprazole (PRILOSEC) 20 MG capsule Take 20 mg by mouth daily.   No facility-administered encounter medications on  file as of 08/28/2021.    Allergies (verified) Codeine, Lisinopril, and Sulfa antibiotics   History: Past Medical History:  Diagnosis Date   Allergic rhinitis    Allergy    Endometriosis    Heart murmur 1975   minimal, seen on ultrasound long ago   Hypertension    Hypothyroidism    IBS (irritable bowel syndrome)    Migraine    Osteoarthritis    Rheumatoid arthritis ([place])    Past Surgical History:  Procedure  Laterality Date   ABDOMINAL HYSTERECTOMY     APPENDECTOMY     BREAST EXCISIONAL BIOPSY Right 1975   excisional - negative   BREAST SURGERY  1975?   benign cyst removed   COLONOSCOPY WITH PROPOFOL N/A 02/03/2016   Procedure: COLONOSCOPY WITH PROPOFOL;  Surgeon: Lollie Sails, MD;  Location: [facility] ENDOSCOPY;  Service: Endoscopy;  Laterality: N/A;   LEFT HEART CATH AND CORONARY ANGIOGRAPHY N/A 07/10/2021   Procedure: LEFT HEART CATH AND CORONARY ANGIOGRAPHY;  Surgeon: Wellington Hampshire, MD;  Location: [facility];  Service: Cardiovascular;  Laterality: N/A;   OOPHORECTOMY Bilateral    TONSILLECTOMY     TUBAL LIGATION     Family History  Problem Relation Age of Onset   Breast cancer Paternal Grandmother    Cancer Paternal Grandmother        Breast   Heart disease Paternal Grandmother    Stroke Mother    Arthritis Mother        Osteo and rheumatoid   Hypertension Mother    Angina Mother    Vision loss Mother    Cancer Father        Multiple Myeloma   Gout Father    Cancer Sister        Breast   Heart disease Sister    Cancer Sister        Skin   Cancer Brother        prostate   Social History   Socioeconomic History   Marital status: Married    Spouse name: Not on file   Number of children: Not on file   Years of education: Not on file   Highest education level: Not on file  Occupational History   Occupation: retired  Tobacco Use   Smoking status: Never   Smokeless tobacco: Never   Tobacco comments:    never smoked, around lots of second hand  Vaping Use   Vaping Use: Never used  Substance and Sexual Activity   Alcohol use: Not Currently   Drug use: No   Sexual activity: Not Currently  Other Topics Concern   Not on file  Social History Narrative   Not on file   Social Determinants of Health   Financial Resource Strain: Low Risk    Difficulty of Paying Living Expenses: Not hard at all  Food Insecurity: No Food Insecurity   Worried About Paediatric nurse in the Last Year: Never true   [place] in the Last Year: Never true  Transportation Needs: No Transportation Needs   Lack of Transportation (Medical): No   Lack of Transportation (Non-Medical): No  Physical Activity: Insufficiently Active   Days of Exercise per Week: 3 days   Minutes of Exercise per Session: 30 min  Stress: No Stress Concern Present   Feeling of Stress : Not at all  Social Connections: Not on file    Tobacco Counseling Counseling given: Not Answered Tobacco comments: never smoked, around  lots of second hand   Clinical Intake:  Pre-visit preparation completed: Yes  Pain : No/denies pain     Nutritional Status: BMI of 19-24  Normal Nutritional Risks: None Diabetes: No  How often do you need to have someone help you when you read instructions, pamphlets, or other written materials from your doctor or pharmacy?: 1 - Never What is the last grade level you completed in school?: 57yr graduate  Diabetic? no  Interpreter Needed?: No  Information entered by :: NAllen LPN   Activities of Daily Living In your present state of health, do you have any difficulty performing the following activities: 08/28/2021 07/09/2021  Hearing? N -  Vision? N -  Difficulty concentrating or making decisions? N -  Walking or climbing stairs? N -  Dressing or bathing? N -  Doing errands, shopping? N N  Preparing Food and eating ? N -  Using the Toilet? N -  In the past six months, have you accidently leaked urine? N -  Do you have problems with loss of bowel control? N -  Managing your Medications? N -  Managing your Finances? N -  Housekeeping or managing your Housekeeping? N -  Some recent data might be hidden    Patient Care Team: JValerie Roys DO as PCP - General (Family Medicine)  Indicate any recent Medical Services you may have received from other than [facility] providers in the past year (date may be approximate).     Assessment:   [facility]  Hearing/Vision screen Vision Screening - Comments:: [facility] Dietary issues and exercise activities discussed: Current Exercise Habits: Home exercise routine, Type of exercise: stretching;Other - see comments (pilates), Time (Minutes): 30, Frequency (Times/Week): 3, Weekly Exercise (Minutes/Week): 90   Goals Addressed             This Visit's Progress    Patient Stated       08/28/2021, stay healthy and keeping an eye on BP       Depression Screen PHQ 2/9 Scores 08/28/2021 08/15/2020 08/05/2020 07/27/2019 07/25/2018 05/25/2018  PHQ - 2 Score 0 0 0 '1 2 1  ' PHQ- 9 Score - - - 3 3 -    Fall Risk Fall Risk  08/28/2021 08/15/2020 08/05/2020 07/27/2019 01/23/2019  Falls in the past year? 0 0 0 0 0  Number falls in past yr: - - 0 - -  Injury with Fall? - - 0 0 -  Risk for fall due to : Medication side effect Medication side effect No Fall Risks - -  Follow up Falls evaluation completed;Education provided;Falls prevention discussed Falls evaluation completed;Education provided;Falls prevention discussed Falls evaluation completed - Falls evaluation completed    FALL RISK PREVENTION PERTAINING TO THE HOME:  Any stairs in or around the home? Yes  If so, are there any without handrails? No  Home free of loose throw rugs in walkways, pet beds, electrical cords, etc? Yes  Adequate lighting in your home to reduce risk of falls? Yes   ASSISTIVE DEVICES UTILIZED TO PREVENT FALLS:  Life alert? No  Use of a cane, walker or w/c? No  Grab bars in the bathroom? Yes  Shower chair or bench in shower? Yes  Elevated toilet seat or a handicapped toilet? Yes   TIMED UP AND GO:  Was the test performed? No .       Cognitive Function:     6CIT Screen 08/28/2021  08/15/2020 07/27/2019 07/25/2018  What Year? 0 points 0 points 0 points 0 points  What month? 0 points 0 points 0 points 0 points  What time? 0 points 0 points 0 points  0 points  Count back from 20 0 points 0 points 0 points 0 points  Months in reverse 0 points 0 points 2 points 0 points  Repeat phrase 0 points 0 points 0 points 0 points  Total Score 0 0 2 0    Immunizations Immunization History  Administered Date(s) Administered   Fluad Quad(high Dose 65+) 07/27/2019, 08/05/2020, 08/11/2021   Influenza, High Dose Seasonal PF 08/08/2018   Influenza-Unspecified 07/24/2015, 09/17/2016   PFIZER Comirnaty(Gray Top)Covid-19 Tri-Sucrose Vaccine 03/13/2021   PFIZER(Purple Top)SARS-COV-2 Vaccination 01/04/2020, 01/29/2020, 08/28/2020   Pneumococcal Conjugate-13 09/06/2012, 11/30/2016   Pneumococcal Polysaccharide-23 07/25/2018   Td 07/25/2018   Zoster Recombinat (Shingrix) 02/10/2021, 04/13/2021   Zoster, Live 09/03/2010    TDAP status: Up to date  Flu Vaccine status: Up to date  Pneumococcal vaccine status: Up to date  Covid-19 vaccine status: Completed vaccines  Qualifies for Shingles Vaccine? Yes   Zostavax completed Yes   Shingrix Completed?: Yes  Screening Tests Health Maintenance  Topic Date Due   COVID-19 Vaccine (5 - Booster for Pfizer series) 05/08/2021   DEXA SCAN  08/21/2022   COLONOSCOPY (Pts 45-3yr Insurance coverage will need to be confirmed)  02/02/2026   TETANUS/TDAP  07/25/2028   Pneumonia Vaccine 75 Years old  Completed   INFLUENZA VACCINE  Completed   Hepatitis C Screening  Completed   Zoster Vaccines- Shingrix  Completed   HPV VACCINES  Aged Out    Health Maintenance  Health Maintenance Due  Topic Date Due   COVID-19 Vaccine (5 - Booster for PWiconsicoseries) 05/08/2021    Colorectal cancer screening: No longer required.   Mammogram status: Completed 08/25/2021. Repeat every year  Bone Density status: Completed 08/22/2019.  Lung Cancer Screening: (Low Dose CT Chest recommended if Age 75-80years, 30 pack-year currently smoking OR have quit w/in 15years.) does not qualify.   Lung Cancer Screening Referral:  no  Additional Screening:  Hepatitis C Screening: does qualify; Completed 05/26/2018  Vision Screening: Recommended annual ophthalmology exams for early detection of glaucoma and other disorders of the eye. Is the patient up to date with their annual eye exam?  Yes  Who is the provider or what is the name of the office in which the patient attends annual eye exams? [facility] not [facility] with a provider, would they like to be referred to a provider to establish care? No .   Dental Screening: Recommended annual dental exams for proper oral hygiene  Community Resource Referral / Chronic Care Management: CRR required this visit?  No   CCM required this visit?  No      Plan:     I have personally reviewed and noted the following in the patient's chart:   Medical and social history Use of alcohol, tobacco or illicit drugs  Current medications and supplements including opioid prescriptions.  Functional ability and status Nutritional status Physical activity Advanced directives List of other physicians Hospitalizations, surgeries, and ER visits in previous 12 months Vitals Screenings to include cognitive, depression, and falls Referrals and appointments  In addition, I have reviewed and discussed with patient certain preventive protocols, quality metrics, and best practice recommendations. A written personalized care plan for preventive services as well as general preventive health recommendations were provided to patient.  Kellie Simmering, LPN   [id number]   Nurse Notes:

## 2021-08-28 NOTE — Patient Instructions (Signed)
Barbara Tucker , Thank you for taking time to come for your Medicare Wellness Visit. I appreciate your ongoing commitment to your health goals. Please review the following plan we discussed and let me know if I can assist you in the future.   Screening recommendations/referrals: Colonoscopy: not required Mammogram: completed 08/25/2021 Bone Density: completed 08/22/2019 Recommended yearly ophthalmology/optometry visit for glaucoma screening and checkup Recommended yearly dental visit for hygiene and checkup  Vaccinations: Influenza vaccine: completed 08/11/2021 Pneumococcal vaccine: completed 07/25/2018 Tdap vaccine: completed 07/25/2018, due 07/25/2028 Shingles vaccine: completed   Covid-19: 03/13/2021, 08/28/2020, 01/29/2020, 01/04/2020  Advanced directives: copy in chart  Conditions/risks identified: none  Next appointment: Follow up in one year for your annual wellness visit    Preventive Care 65 Years and Older, Female Preventive care refers to lifestyle choices and visits with your health care provider that can promote health and wellness. What does preventive care include? A yearly physical exam. This is also called an annual well check. Dental exams once or twice a year. Routine eye exams. Ask your health care provider how often you should have your eyes checked. Personal lifestyle choices, including: Daily care of your teeth and gums. Regular physical activity. Eating a healthy diet. Avoiding tobacco and drug use. Limiting alcohol use. Practicing safe sex. Taking low-dose aspirin every day. Taking vitamin and mineral supplements as recommended by your health care provider. What happens during an annual well check? The services and screenings done by your health care provider during your annual well check will depend on your age, overall health, lifestyle risk factors, and family history of disease. Counseling  Your health care provider may ask you questions about  your: Alcohol use. Tobacco use. Drug use. Emotional well-being. Home and relationship well-being. Sexual activity. Eating habits. History of falls. Memory and ability to understand (cognition). Work and work Astronomer. Reproductive health. Screening  You may have the following tests or measurements: Height, weight, and BMI. Blood pressure. Lipid and cholesterol levels. These may be checked every 5 years, or more frequently if you are over 3 years old. Skin check. Lung cancer screening. You may have this screening every year starting at age 70 if you have a 30-pack-year history of smoking and currently smoke or have quit within the past 15 years. Fecal occult blood test (FOBT) of the stool. You may have this test every year starting at age 31. Flexible sigmoidoscopy or colonoscopy. You may have a sigmoidoscopy every 5 years or a colonoscopy every 10 years starting at age 47. Hepatitis C blood test. Hepatitis B blood test. Sexually transmitted disease (STD) testing. Diabetes screening. This is done by checking your blood sugar (glucose) after you have not eaten for a while (fasting). You may have this done every 1-3 years. Bone density scan. This is done to screen for osteoporosis. You may have this done starting at age 66. Mammogram. This may be done every 1-2 years. Talk to your health care provider about how often you should have regular mammograms. Talk with your health care provider about your test results, treatment options, and if necessary, the need for more tests. Vaccines  Your health care provider may recommend certain vaccines, such as: Influenza vaccine. This is recommended every year. Tetanus, diphtheria, and acellular pertussis (Tdap, Td) vaccine. You may need a Td booster every 10 years. Zoster vaccine. You may need this after age 91. Pneumococcal 13-valent conjugate (PCV13) vaccine. One dose is recommended after age 63. Pneumococcal polysaccharide (PPSV23) vaccine.  One dose is recommended  after age 55. Talk to your health care provider about which screenings and vaccines you need and how often you need them. This information is not intended to replace advice given to you by your health care provider. Make sure you discuss any questions you have with your health care provider. Document Released: 11/21/2015 Document Revised: 07/14/2016 Document Reviewed: 08/26/2015 Elsevier Interactive Patient Education  2017 [place] Prevention in the Home Falls can cause injuries. They can happen to people of all ages. There are many things you can do to make your home safe and to help prevent falls. What can I do on the outside of my home? Regularly fix the edges of walkways and driveways and fix any cracks. Remove anything that might make you trip as you walk through a door, such as a raised step or threshold. Trim any bushes or trees on the path to your home. Use bright outdoor lighting. Clear any walking paths of anything that might make someone trip, such as rocks or tools. Regularly check to see if handrails are loose or broken. Make sure that both sides of any steps have handrails. Any raised decks and porches should have guardrails on the edges. Have any leaves, snow, or ice cleared regularly. Use sand or salt on walking paths during winter. Clean up any spills in your garage right away. This includes oil or grease spills. What can I do in the bathroom? Use night lights. Install grab bars by the toilet and in the tub and shower. Do not use towel bars as grab bars. Use non-skid mats or decals in the tub or shower. If you need to sit down in the shower, use a plastic, non-slip stool. Keep the floor dry. Clean up any water that spills on the floor as soon as it happens. Remove soap buildup in the tub or shower regularly. Attach bath mats securely with double-sided non-slip rug tape. Do not have throw rugs and other things on the floor that can make  you trip. What can I do in the bedroom? Use night lights. Make sure that you have a light by your bed that is easy to reach. Do not use any sheets or blankets that are too big for your bed. They should not hang down onto the floor. Have a firm chair that has side arms. You can use this for support while you get dressed. Do not have throw rugs and other things on the floor that can make you trip. What can I do in the kitchen? Clean up any spills right away. Avoid walking on wet floors. Keep items that you use a lot in easy-to-reach places. If you need to reach something above you, use a strong step stool that has a grab bar. Keep electrical cords out of the way. Do not use floor polish or wax that makes floors slippery. If you must use wax, use non-skid floor wax. Do not have throw rugs and other things on the floor that can make you trip. What can I do with my stairs? Do not leave any items on the stairs. Make sure that there are handrails on both sides of the stairs and use them. Fix handrails that are broken or loose. Make sure that handrails are as long as the stairways. Check any carpeting to make sure that it is firmly attached to the stairs. Fix any carpet that is loose or worn. Avoid having throw rugs at the top or bottom of the stairs. If you do  have throw rugs, attach them to the floor with carpet tape. Make sure that you have a light switch at the top of the stairs and the bottom of the stairs. If you do not have them, ask someone to add them for you. What else can I do to help prevent falls? Wear shoes that: Do not have high heels. Have rubber bottoms. Are comfortable and fit you well. Are closed at the toe. Do not wear sandals. If you use a stepladder: Make sure that it is fully opened. Do not climb a closed stepladder. Make sure that both sides of the stepladder are locked into place. Ask someone to hold it for you, if possible. Clearly mark and make sure that you can  see: Any grab bars or handrails. First and last steps. Where the edge of each step is. Use tools that help you move around (mobility aids) if they are needed. These include: Canes. Walkers. Scooters. Crutches. Turn on the lights when you go into a dark area. Replace any light bulbs as soon as they burn out. Set up your furniture so you have a clear path. Avoid moving your furniture around. If any of your floors are uneven, fix them. If there are any pets around you, be aware of where they are. Review your medicines with your doctor. Some medicines can make you feel dizzy. This can increase your chance of falling. Ask your doctor what other things that you can do to help prevent falls. This information is not intended to replace advice given to you by your health care provider. Make sure you discuss any questions you have with your health care provider. Document Released: 08/21/2009 Document Revised: 04/01/2016 Document Reviewed: 11/29/2014 Elsevier Interactive Patient Education  2017 [employer].

## 2021-10-21 ENCOUNTER — Ambulatory Visit (INDEPENDENT_AMBULATORY_CARE_PROVIDER_SITE_OTHER): Payer: Medicare Other

## 2021-10-21 ENCOUNTER — Other Ambulatory Visit: Payer: Self-pay

## 2021-10-21 DIAGNOSIS — I428 Other cardiomyopathies: Secondary | ICD-10-CM

## 2021-10-21 LAB — ECHOCARDIOGRAM LIMITED
Area-P 1/2: 3.26 cm2
Calc EF: 55.9 %
S' Lateral: 2.2 cm
Single Plane A2C EF: 54.5 %
Single Plane A4C EF: 58.7 %

## 2021-10-21 MED ORDER — PERFLUTREN LIPID MICROSPHERE
1.0000 mL | INTRAVENOUS | Status: AC | PRN
  Administered 2021-10-21: 2 mL via INTRAVENOUS

## 2021-10-21 NOTE — Progress Notes (Signed)
Cardiology Office Note  Date:  10/23/2021   ID:  Barbara, Tucker August 03, 1946, MRN [id number]  PCP:  Dorcas Carrow, DO   Chief Complaint  Patient presents with   Other    3 month f/u no complaints today. Meds reviewed verbally with pt.    HPI:  Barbara Tucker is a 75 y.o. female with PMH  Nonischemic cardiomyopathy Ejection fraction 40 to 45% Normal coronary arteries, concern for stress cardiomyopathy September 2022 Echo ejection fraction 45 to 50% Who presents for follow-up of her nonischemic cardiomyopathy  Records reviewed from September 2022  Stressful situation in 07/2021, had to give a presentation, in the heat Acute SOB [facility] ED, HS T[address] 323  2014.0.   EKG NSR, 61 bpm, poor R wave progression in III, V1-V3, nonspecific changes.   TSH elevated at 5.032.  LDL 72.   CT without evidence of PE.   Carotids without hemodynamically significant stenosis.   9/22:  Echo EF 45-50%.  LHC showed nl cors, mildly to moderately reduced LVSF with EF 40% and WMA thought most consistent with Takotsubo CM.   Echo 10/21/21  1. Left ventricular ejection fraction, by estimation, is 60 to 65%. The  left ventricle has normal function. The left ventricle has no regional  wall motion abnormalities.   2. The mitral valve is normal in structure. No evidence of mitral valve  regurgitation.   Some mild SOB episodes Sedentary, no exercise Helps take care of her elderly  husband who has medical issues   PMH:   has a past medical history of Allergic rhinitis, Allergy, Endometriosis, Heart murmur (1975), Hypertension, Hypothyroidism, IBS (irritable bowel syndrome), Migraine, Osteoarthritis, and Rheumatoid arthritis (HCC).  PSH:    Past Surgical History:  Procedure Laterality Date   ABDOMINAL HYSTERECTOMY     APPENDECTOMY     BREAST EXCISIONAL BIOPSY Right 1975   excisional - negative   BREAST SURGERY  1975?   benign cyst removed   COLONOSCOPY WITH PROPOFOL N/A 02/03/2016    Procedure: COLONOSCOPY WITH PROPOFOL;  Surgeon: Christena Deem, MD;  Location: [facility] ENDOSCOPY;  Service: Endoscopy;  Laterality: N/A;   LEFT HEART CATH AND CORONARY ANGIOGRAPHY N/A 07/10/2021   Procedure: LEFT HEART CATH AND CORONARY ANGIOGRAPHY;  Surgeon: Iran Ouch, MD;  Location: [facility] INVASIVE CV LAB;  Service: Cardiovascular;  Laterality: N/A;   OOPHORECTOMY Bilateral    TONSILLECTOMY     TUBAL LIGATION      Current Outpatient Medications  Medication Sig Dispense Refill   ascorbic acid (VITAMIN C) 500 MG tablet Take 500 mg by mouth daily.     calcium carbonate (OSCAL) 1500 (600 Ca) MG TABS tablet Take 600 mg of elemental calcium by mouth 2 (two) times daily with a meal.     Calcium Polycarbophil (FIBER-CAPS PO) Take by mouth.     dicyclomine (BENTYL) 10 MG capsule Take 1 capsule (10 mg total) by mouth 4 (four) times daily as needed for spasms.     estradiol (ESTRACE) 1 MG tablet Take 1 tablet (1 mg total) by mouth daily. 90 tablet 1   etodolac (LODINE) 400 MG tablet Take 1 tablet (400 mg total) by mouth 2 (two) times daily as needed.  3   fexofenadine (ALLEGRA) 180 MG tablet Take 180 mg by mouth daily.     folic acid (FOLVITE) 1 MG tablet Take 1 mg by mouth daily.     HUMIRA PEN 40 MG/0.4ML PNKT Inject 40 mg into the muscle every 14 (  fourteen) days.     losartan (COZAAR) 50 MG tablet Take 1 tablet (50 mg total) by mouth daily. 90 tablet 1   methotrexate 2.5 MG tablet Take 20 mg by mouth once a week. Caution:Chemotherapy. Protect from light.     metoprolol succinate (TOPROL-XL) 25 MG 24 hr tablet Take 1 tablet (25 mg total) by mouth daily. 90 tablet 1   Multiple Vitamin (MULTIVITAMIN) tablet Take 1 tablet by mouth daily.     Omega-3 Fatty Acids (OMEGA-3 EPA FISH OIL PO) Take 340-1,000 mg by mouth daily.     omeprazole (PRILOSEC) 20 MG capsule Take 20 mg by mouth daily.     No current facility-administered medications for this visit.    Allergies:   Codeine, Lisinopril, and  Sulfa antibiotics   Social History:  The patient  reports that she has never smoked. She has never used smokeless tobacco. She reports that she does not currently use alcohol. She reports that she does not use drugs.   Family History:   family history includes Angina in her mother; Arthritis in her mother; Breast cancer in her paternal grandmother; Cancer in her brother, father, paternal grandmother, sister, and sister; Gout in her father; Heart disease in her paternal grandmother and sister; Hypertension in her mother; Stroke in her mother; Vision loss in her mother.    Review of Systems: Review of Systems  Constitutional: Negative.   HENT: Negative.    Respiratory: Negative.    Cardiovascular: Negative.   Gastrointestinal: Negative.   Musculoskeletal: Negative.   Neurological: Negative.   Psychiatric/Behavioral: Negative.    All other systems reviewed and are negative.   PHYSICAL EXAM: VS:  BP 140/80 (BP Location: Left Arm, Patient Position: Sitting, Cuff Size: Normal)    Pulse 67    Ht 5\' 7"  (1.702 m)    Wt 148 lb 8 oz (67.4 kg)    SpO2 98%    BMI 23.26 kg/m  , BMI Body mass index is 23.26 kg/m. GEN: Well nourished, well developed, in no acute distress HEENT: normal Neck: no JVD, carotid bruits, or masses Cardiac: RRR; no murmurs, rubs, or gallops,no edema  Respiratory:  clear to auscultation bilaterally, normal work of breathing GI: soft, nontender, nondistended, + BS MS: no deformity or atrophy Skin: warm and dry, no rash Neuro:  Strength and sensation are intact Psych: euthymic mood, full affect   Recent Labs: 08/11/2021: ALT 21; BUN 10; Creatinine, Ser 0.67; Hemoglobin 13.3; Platelets 247; Potassium 4.0; Sodium 140; TSH 3.270    Lipid Panel Lab Results  Component Value Date   CHOL 177 08/11/2021   HDL 76 08/11/2021   LDLCALC 78 08/11/2021   TRIG 135 08/11/2021      Wt Readings from Last 3 Encounters:  10/23/21 148 lb 8 oz (67.4 kg)  08/28/21 145 lb (65.8 kg)   08/11/21 144 lb (65.3 kg)       ASSESSMENT AND PLAN:  Problem List Items Addressed This Visit       Cardiology Problems   Takotsubo cardiomyopathy     Other   Rheumatoid arthritis (HCC)   Other Visit Diagnoses     NICM (nonischemic cardiomyopathy) (HCC)    -  Primary   Essential hypertension       Chronic HFrEF (heart failure with reduced ejection fraction) (HCC)          Cardiomyopathy Recent echocardiogram reviewed on today's visit,EF up to 60% Euvolemic, feels well Recommended that she stay on current meds Metoprolol and losartan  at current dosing No further testing needed  HTN Well controlled at home  Preventive care Recommended regular walking program  Chronic systolic and diastolic CHF Ejection fraction has normalized, appears euvolemic     Total encounter time more than 25 minutes  Greater than 50% was spent in counseling and coordination of care with the patient    Signed, Dossie Arbour, M.D., Ph.D. [facility] [place], [place] [phone]

## 2021-10-23 ENCOUNTER — Encounter: Payer: Self-pay | Admitting: Cardiovascular Disease

## 2021-10-23 ENCOUNTER — Ambulatory Visit: Payer: Medicare Other | Admitting: Cardiovascular Disease

## 2021-10-23 ENCOUNTER — Other Ambulatory Visit: Payer: Self-pay

## 2021-10-23 VITALS — BP 140/80 | HR 67 | Ht 67.0 in | Wt 148.5 lb

## 2021-10-23 DIAGNOSIS — I10 Essential (primary) hypertension: Secondary | ICD-10-CM | POA: Diagnosis not present

## 2021-10-23 DIAGNOSIS — I5181 Takotsubo syndrome: Secondary | ICD-10-CM | POA: Diagnosis not present

## 2021-10-23 DIAGNOSIS — I5022 Chronic systolic (congestive) heart failure: Secondary | ICD-10-CM

## 2021-10-23 DIAGNOSIS — M059 Rheumatoid arthritis with rheumatoid factor, unspecified: Secondary | ICD-10-CM

## 2021-10-23 DIAGNOSIS — I428 Other cardiomyopathies: Secondary | ICD-10-CM

## 2021-10-23 NOTE — Patient Instructions (Addendum)
Medication Instructions:  No changes  If you need a refill on your cardiac medications before your next appointment, please call your pharmacy.   Lab work: No new labs needed  Testing/Procedures: No new testing needed  Follow-Up: At [facility], you and your health needs are our priority.  As part of our continuing mission to provide you with exceptional heart care, we have created designated Provider Care Teams.  These Care Teams include your primary Cardiologist (physician) and Advanced Practice Providers (APPs -  Physician Assistants and Nurse Practitioners) who all work together to provide you with the care you need, when you need it.  You will need a follow up appointment in 12 months  Providers on your designated Care Team:   Christopher Berge, NP Ryan Dunn, PA-C Cadence Furth, PA-C  COVID-19 Vaccine Information can be found at: [email or web address] For questions related to vaccine distribution or appointments, please email [email or web address] or call [phone].   

## 2021-12-29 DIAGNOSIS — D2272 Melanocytic nevi of left lower limb, including hip: Secondary | ICD-10-CM | POA: Diagnosis not present

## 2021-12-29 DIAGNOSIS — D2261 Melanocytic nevi of right upper limb, including shoulder: Secondary | ICD-10-CM | POA: Diagnosis not present

## 2021-12-29 DIAGNOSIS — D225 Melanocytic nevi of trunk: Secondary | ICD-10-CM | POA: Diagnosis not present

## 2021-12-29 DIAGNOSIS — D2262 Melanocytic nevi of left upper limb, including shoulder: Secondary | ICD-10-CM | POA: Diagnosis not present

## 2021-12-30 DIAGNOSIS — Z79899 Other long term (current) drug therapy: Secondary | ICD-10-CM | POA: Diagnosis not present

## 2021-12-30 DIAGNOSIS — M059 Rheumatoid arthritis with rheumatoid factor, unspecified: Secondary | ICD-10-CM | POA: Diagnosis not present

## 2022-01-05 ENCOUNTER — Telehealth: Payer: Self-pay | Admitting: Cardiovascular Disease

## 2022-01-05 NOTE — Telephone Encounter (Signed)
° °  Pre-operative Risk Assessment    Patient Name: Barbara Tucker  DOB: 1946-05-15 MRN: [id number]     Request for Surgical Clearance    Procedure:   Dental Treatment  Date of Surgery:  Clearance TBD                                 Surgeon:  Not noted   Surgeon's Group or Practice Name:  [facility] Phone number:  [phone]  Fax number:  [phone]   Type of Clearance Requested:   Pre med    Type of Anesthesia:  Not Indicated   Additional requests/questions:  patient reported to office she no longer needs pre med due to being mis diagnosed previously for mitral valve prolapse.  Dental office would like written verification before dc premed standing order for care   Signed, Joline Maxcy   01/05/2022, 4:18 PM

## 2022-01-06 NOTE — Telephone Encounter (Signed)
? ?  Patient Name: Barbara Tucker  ?DOB: 1946/02/25 ?MRN: [id number] ? ?Primary Cardiologist: None ? ?Chart reviewed as part of pre-operative protocol coverage.  ? ?IF SIMPLE EXTRACTION/CLEANINGS: Simple dental extractions are considered low risk procedures per guidelines and generally do not require any specific cardiac clearance. It is also generally accepted that for simple extractions and dental cleanings, there is no need to interrupt blood thinner therapy. ? ?Patient's chart reviewed. She does not have a prosthetic heart valve. Last echocardiogram (12/22)  indicated normal heart function and no valvular disease.  ? ?SBE prophylaxis is not required for the patient from a cardiac standpoint. ? ?I will route this recommendation to the requesting party via Epic fax function and remove from pre-op pool. ? ?Please call with questions. ? ?Sharlene Dory, PA-C ?01/06/2022, 11:22 AM ? ?

## 2022-01-14 DIAGNOSIS — G8929 Other chronic pain: Secondary | ICD-10-CM | POA: Diagnosis not present

## 2022-01-14 DIAGNOSIS — M159 Polyosteoarthritis, unspecified: Secondary | ICD-10-CM | POA: Diagnosis not present

## 2022-01-14 DIAGNOSIS — M059 Rheumatoid arthritis with rheumatoid factor, unspecified: Secondary | ICD-10-CM | POA: Diagnosis not present

## 2022-01-14 DIAGNOSIS — M25571 Pain in right ankle and joints of right foot: Secondary | ICD-10-CM | POA: Diagnosis not present

## 2022-02-09 ENCOUNTER — Encounter: Payer: Self-pay | Admitting: Family Medicine

## 2022-02-09 ENCOUNTER — Ambulatory Visit (INDEPENDENT_AMBULATORY_CARE_PROVIDER_SITE_OTHER): Payer: Medicare Other | Admitting: Family Medicine

## 2022-02-09 VITALS — BP 150/82 | HR 60 | Temp 98.8°F | Wt 149.6 lb

## 2022-02-09 DIAGNOSIS — E038 Other specified hypothyroidism: Secondary | ICD-10-CM | POA: Diagnosis not present

## 2022-02-09 DIAGNOSIS — R4789 Other speech disturbances: Secondary | ICD-10-CM

## 2022-02-09 DIAGNOSIS — M059 Rheumatoid arthritis with rheumatoid factor, unspecified: Secondary | ICD-10-CM | POA: Diagnosis not present

## 2022-02-09 DIAGNOSIS — I10 Essential (primary) hypertension: Secondary | ICD-10-CM

## 2022-02-09 MED ORDER — METOPROLOL SUCCINATE ER 25 MG PO TB24: 25.0000 mg | ORAL_TABLET | Freq: Every day | ORAL | 1 refills | Status: DC

## 2022-02-09 MED ORDER — LOSARTAN POTASSIUM 50 MG PO TABS: 50.0000 mg | ORAL_TABLET | Freq: Every day | ORAL | 1 refills | Status: DC

## 2022-02-09 MED ORDER — ESTRADIOL 1 MG PO TABS
1.0000 mg | ORAL_TABLET | Freq: Every day | ORAL | 1 refills | Status: DC
Start: 2022-02-09 — End: 2022-08-12

## 2022-02-09 NOTE — Assessment & Plan Note (Signed)
Continue to follow with rheumatology. Call with any concerns.  

## 2022-02-09 NOTE — Assessment & Plan Note (Signed)
Rechecking labs today. Await results. Treat as needed.  °

## 2022-02-09 NOTE — Assessment & Plan Note (Signed)
Running high today- will monitor at home and recheck in by mychart in about 2 weeks. If not dropping back down, will consider increasing medicine.  ?

## 2022-02-09 NOTE — Progress Notes (Signed)
? ?BP (!) 150/82   Pulse 60   Temp 98.8 ?F (37.1 ?C)   Wt 149 lb 9.6 oz (67.9 kg)   SpO2 98%   BMI 23.43 kg/m?   ? ?Subjective:  ? ? Patient ID: Barbara Tucker, female    DOB: November 08, 1946, 76 y.o.   MRN: [id number] ? ?HPI: ?Barbara Tucker is a 76 y.o. female ? ?Chief Complaint  ?Patient presents with  ? Hypothyroidism  ? Hypertension  ? ?HYPERTENSION ?Hypertension status: controlled  ?Satisfied with current treatment? no ?Duration of hypertension: chronic ?BP monitoring frequency:  occasionally- 120/80s ?BP medication side effects:  no ?Medication compliance: excellent compliance ?Previous BP meds:metoprolol and losartan ?Aspirin: no ?Recurrent headaches: no ?Visual changes: no ?Palpitations: no ?Dyspnea: no ?Chest pain: no ?Lower extremity edema: no ?Dizzy/lightheaded: no ? ?HYPOTHYROIDISM ?Thyroid control status:controlled ?Satisfied with current treatment? yes ?Medication side effects: no ?Medication compliance: excellent compliance ?Etiology of hypothyroidism:  ?Recent dose adjustment:no ?Fatigue: yes ?Cold intolerance: no ?Heat intolerance: no ?Weight gain: no ?Weight loss: no ?Constipation: no ?Diarrhea/loose stools: no ?Palpitations: no ?Lower extremity edema: no ?Anxiety/depressed mood: no ? ?Relevant past medical, surgical, family and social history reviewed and updated as indicated. Interim medical history since our last visit reviewed. ?Allergies and medications reviewed and updated. ? ?Review of Systems  ?Constitutional: Negative.   ?Respiratory: Negative.    ?Cardiovascular: Negative.   ?Musculoskeletal: Negative.   ?     Side pain that comes and goes- feels like a pinch  ?Neurological: Negative.   ?Psychiatric/Behavioral: Negative.    ? ?Per HPI unless specifically indicated above ? ?   ?Objective:  ?  ?BP (!) 150/82   Pulse 60   Temp 98.8 ?F (37.1 ?C)   Wt 149 lb 9.6 oz (67.9 kg)   SpO2 98%   BMI 23.43 kg/m?   ?Wt Readings from Last 3 Encounters:  ?02/09/22 149 lb 9.6 oz (67.9  kg)  ?10/23/21 148 lb 8 oz (67.4 kg)  ?08/28/21 145 lb (65.8 kg)  ?  ?Physical Exam ?Vitals and nursing note reviewed.  ?Constitutional:   ?   General: She is not in acute distress. ?   Appearance: Normal appearance. She is not ill-appearing, toxic-appearing or diaphoretic.  ?HENT:  ?   Head: Normocephalic and atraumatic.  ?   Right Ear: External ear normal.  ?   Left Ear: External ear normal.  ?   Nose: Nose normal.  ?   Mouth/Throat:  ?   Mouth: Mucous membranes are moist.  ?   Pharynx: Oropharynx is clear.  ?Eyes:  ?   General: No scleral icterus.    ?   Right eye: No discharge.     ?   Left eye: No discharge.  ?   Extraocular Movements: Extraocular movements intact.  ?   Conjunctiva/sclera: Conjunctivae normal.  ?   Pupils: Pupils are equal, round, and reactive to light.  ?Cardiovascular:  ?   Rate and Rhythm: Normal rate and regular rhythm.  ?   Pulses: Normal pulses.  ?   Heart sounds: Normal heart sounds. No murmur heard. ?  No friction rub. No gallop.  ?Pulmonary:  ?   Effort: Pulmonary effort is normal. No respiratory distress.  ?   Breath sounds: Normal breath sounds. No stridor. No wheezing, rhonchi or rales.  ?Chest:  ?   Chest wall: No tenderness.  ?Musculoskeletal:     ?   General: Normal range of motion.  ?   Cervical back: Normal range  of motion and neck supple.  ?Skin: ?   General: Skin is warm and dry.  ?   Capillary Refill: Capillary refill takes less than 2 seconds.  ?   Coloration: Skin is not jaundiced or pale.  ?   Findings: No bruising, erythema, lesion or rash.  ?Neurological:  ?   General: No focal deficit present.  ?   Mental Status: She is alert and oriented to person, place, and time. Mental status is at baseline.  ?Psychiatric:     ?   Mood and Affect: Mood normal.     ?   Behavior: Behavior normal.     ?   Thought Content: Thought content normal.     ?   Judgment: Judgment normal.  ? ? ?Results for orders placed or performed in visit on 10/21/21  ?ECHOCARDIOGRAM LIMITED  ?Result Value  Ref Range  ? S' Lateral 2.20 cm  ? Area-P 1/2 3.26 cm2  ? Single Plane A4C EF 58.7 %  ? Single Plane A2C EF 54.5 %  ? Calc EF 55.9 %  ? ?   ?Assessment & Plan:  ? ?Problem List Items Addressed This Visit   ? ?  ? Cardiovascular and Mediastinum  ? Hypertension  ?  Running high today- will monitor at home and recheck in by mychart in about 2 weeks. If not dropping back down, will consider increasing medicine.  ?  ?  ? Relevant Medications  ? losartan (COZAAR) 50 MG tablet  ? metoprolol succinate (TOPROL-XL) 25 MG 24 hr tablet  ? Other Relevant Orders  ? Basic metabolic panel  ?  ? Endocrine  ? Subclinical hypothyroidism - Primary  ?  Rechecking labs today. Await results. Treat as needed.  ?  ?  ? Relevant Medications  ? metoprolol succinate (TOPROL-XL) 25 MG 24 hr tablet  ? Other Relevant Orders  ? TSH  ?  ? Musculoskeletal and Integument  ? Rheumatoid arthritis ([place])  ?  Continue to follow with rheumatology. Call with any concerns.  ?  ?  ? ?Other Visit Diagnoses   ? ? Word finding difficulty      ? Has been under a lot of stress. Will check B12. Await results.   ? Relevant Orders  ? B12  ? ?  ?  ? ?Follow up plan: ?Return in about 6 months (around 08/11/2022) for physical. ? ? ? ? ? ?

## 2022-02-10 LAB — VITAMIN B12: Vitamin B-12: 668 pg/mL (ref 232–1245)

## 2022-02-10 LAB — BASIC METABOLIC PANEL
BUN/Creatinine Ratio: 16 (ref 12–28)
BUN: 11 mg/dL (ref 8–27)
CO2: 25 mmol/L (ref 20–29)
Calcium: 9.4 mg/dL (ref 8.7–10.3)
Chloride: 100 mmol/L (ref 96–106)
Creatinine, Ser: 0.67 mg/dL (ref 0.57–1.00)
Glucose: 99 mg/dL (ref 70–99)
Potassium: 4.3 mmol/L (ref 3.5–5.2)
Sodium: 138 mmol/L (ref 134–144)
eGFR: 91 mL/min/{1.73_m2} (ref >59)

## 2022-02-10 LAB — TSH: TSH: 3.02 u[IU]/mL (ref 0.450–4.500)

## 2022-02-23 ENCOUNTER — Encounter: Payer: Self-pay | Admitting: Family Medicine

## 2022-05-19 DIAGNOSIS — M19041 Primary osteoarthritis, right hand: Secondary | ICD-10-CM | POA: Diagnosis not present

## 2022-05-19 DIAGNOSIS — Z79899 Other long term (current) drug therapy: Secondary | ICD-10-CM | POA: Diagnosis not present

## 2022-05-19 DIAGNOSIS — M19042 Primary osteoarthritis, left hand: Secondary | ICD-10-CM | POA: Diagnosis not present

## 2022-05-19 DIAGNOSIS — M059 Rheumatoid arthritis with rheumatoid factor, unspecified: Secondary | ICD-10-CM | POA: Diagnosis not present

## 2022-05-31 ENCOUNTER — Encounter: Payer: Self-pay | Admitting: Family Medicine

## 2022-06-01 NOTE — Telephone Encounter (Signed)
Please have them drop off forms and I'll see if I need to see them to fill them out thanks.

## 2022-06-02 NOTE — Telephone Encounter (Signed)
Called patient wife and made her aware to drop off forms. Patient wife states she does not have a form but will call [facility] to find out about forms.

## 2022-07-14 ENCOUNTER — Other Ambulatory Visit: Payer: Self-pay | Admitting: Family Medicine

## 2022-07-14 DIAGNOSIS — Z1231 Encounter for screening mammogram for malignant neoplasm of breast: Secondary | ICD-10-CM

## 2022-08-12 ENCOUNTER — Encounter: Payer: Self-pay | Admitting: Family Medicine

## 2022-08-12 ENCOUNTER — Ambulatory Visit (INDEPENDENT_AMBULATORY_CARE_PROVIDER_SITE_OTHER): Payer: Medicare Other | Admitting: Family Medicine

## 2022-08-12 VITALS — BP 146/80 | HR 63 | Ht 67.0 in | Wt 149.5 lb

## 2022-08-12 DIAGNOSIS — Z136 Encounter for screening for cardiovascular disorders: Secondary | ICD-10-CM

## 2022-08-12 DIAGNOSIS — E038 Other specified hypothyroidism: Secondary | ICD-10-CM

## 2022-08-12 DIAGNOSIS — Z0000 Encounter for general adult medical examination without abnormal findings: Secondary | ICD-10-CM

## 2022-08-12 DIAGNOSIS — I10 Essential (primary) hypertension: Secondary | ICD-10-CM

## 2022-08-12 DIAGNOSIS — Z23 Encounter for immunization: Secondary | ICD-10-CM | POA: Diagnosis not present

## 2022-08-12 DIAGNOSIS — M059 Rheumatoid arthritis with rheumatoid factor, unspecified: Secondary | ICD-10-CM

## 2022-08-12 LAB — URINALYSIS, ROUTINE W REFLEX MICROSCOPIC
Bilirubin, UA: NEGATIVE
Glucose, UA: NEGATIVE
Ketones, UA: NEGATIVE
Leukocytes,UA: NEGATIVE
Nitrite, UA: NEGATIVE
Protein,UA: NEGATIVE
RBC, UA: NEGATIVE
Specific Gravity, UA: 1.01 (ref 1.005–1.030)
Urobilinogen, Ur: 0.2 mg/dL (ref 0.2–1.0)
pH, UA: 6 (ref 5.0–7.5)

## 2022-08-12 LAB — MICROALBUMIN, URINE WAIVED
Creatinine, Urine Waived: 50 mg/dL (ref 10–300)
Microalb, Ur Waived: 10 mg/L (ref 0–19)
Microalb/Creat Ratio: <30 mg/g (ref <30)

## 2022-08-12 MED ORDER — METOPROLOL SUCCINATE ER 25 MG PO TB24: 25.0000 mg | ORAL_TABLET | Freq: Every day | ORAL | 1 refills | Status: DC

## 2022-08-12 MED ORDER — LOSARTAN POTASSIUM 50 MG PO TABS: 50.0000 mg | ORAL_TABLET | Freq: Every day | ORAL | 1 refills | Status: DC

## 2022-08-12 MED ORDER — HYDROCHLOROTHIAZIDE 25 MG PO TABS: 25.0000 mg | ORAL_TABLET | Freq: Every day | ORAL | 2 refills | Status: DC

## 2022-08-12 MED ORDER — ESTRADIOL 1 MG PO TABS: 1.0000 mg | ORAL_TABLET | Freq: Every day | ORAL | 1 refills | Status: DC

## 2022-08-12 NOTE — Assessment & Plan Note (Signed)
Continue to follow with rheumatology. Call with any concerns.  

## 2022-08-12 NOTE — Progress Notes (Signed)
BP (!) 146/80   Pulse 63   Ht _0  (1.702 m)   Wt 149 lb 8 oz (67.8 kg)   SpO2 97%   BMI 23.42 kg/m    Subjective:    Patient ID: Barbara Tucker, female    DOB: 03-10-1946, 76 y.o.   MRN: [id number]  HPI: Barbara Tucker is a 76 y.o. female presenting on 08/12/2022 for comprehensive medical examination. Current medical complaints include:  HYPERTENSION  Hypertension status: exacerbated  Satisfied with current treatment? no Duration of hypertension: chronic BP monitoring frequency:  a few times a week- running in the 140s/90s BP medication side effects:  no Medication compliance: excellent compliance Previous BP meds: metoprolol, losartan Aspirin: no Recurrent headaches: no Visual changes: no Palpitations: no Dyspnea: no Chest pain: no Lower extremity edema: no Dizzy/lightheaded: no  HYPOTHYROIDISM Thyroid control status:controlled Satisfied with current treatment? yes Medication side effects: not on anything Recent dose adjustment:no Fatigue: no Cold intolerance: no Heat intolerance: no Weight gain: no Weight loss: no Constipation: no Diarrhea/loose stools: no Palpitations: no Lower extremity edema: no Anxiety/depressed mood: yes  She currently lives with: husband Menopausal Symptoms: no  Depression Screen done today and results listed below:     08/12/2022    1:36 PM 02/09/2022   10:07 AM 08/28/2021    2:32 PM 08/15/2020    1:46 PM 08/05/2020    2:10 PM  Depression screen PHQ 2/9  Decreased Interest 0 0 0 0 0  Down, Depressed, Hopeless 0 0 0 0 0  PHQ - 2 Score 0 0 0 0 0  Altered sleeping 1 0     Tired, decreased energy 0 1     Change in appetite 0 0     Feeling bad or failure about yourself  0 0     Trouble concentrating 0 1     Moving slowly or fidgety/restless 0 0     Suicidal thoughts 0 0     PHQ-9 Score 1 2     Difficult doing work/chores Not difficult at all        Past Medical History:  Past Medical History:  Diagnosis Date    Allergic rhinitis    Allergy    Endometriosis    Heart murmur 1975   minimal, seen on ultrasound long ago   Hypertension    Hypothyroidism    IBS (irritable bowel syndrome)    Migraine    Osteoarthritis    Rheumatoid arthritis ([place])     Surgical History:  Past Surgical History:  Procedure Laterality Date   APPENDECTOMY     BREAST EXCISIONAL BIOPSY Right 1975   excisional - negative   BREAST SURGERY  1975?   benign cyst removed   COLONOSCOPY WITH PROPOFOL N/A 02/03/2016   Procedure: COLONOSCOPY WITH PROPOFOL;  Surgeon: Lollie Sails, MD;  Location: [facility] ENDOSCOPY;  Service: Endoscopy;  Laterality: N/A;   LEFT HEART CATH AND CORONARY ANGIOGRAPHY N/A 07/10/2021   Procedure: LEFT HEART CATH AND CORONARY ANGIOGRAPHY;  Surgeon: Wellington Hampshire, MD;  Location: [facility];  Service: Cardiovascular;  Laterality: N/A;   OOPHORECTOMY Bilateral    TONSILLECTOMY     TOTAL ABDOMINAL HYSTERECTOMY W/ BILATERAL SALPINGOOPHORECTOMY     TUBAL LIGATION      Medications:  Current Outpatient Medications on File Prior to Visit  Medication Sig   ascorbic acid (VITAMIN C) 500 MG tablet Take 500 mg by mouth daily.   calcium carbonate (OSCAL) 1500 (600 Ca) MG  TABS tablet Take 600 mg of elemental calcium by mouth 2 (two) times daily with a meal.   Calcium Polycarbophil (FIBER-CAPS PO) Take by mouth.   dicyclomine (BENTYL) 10 MG capsule Take 1 capsule (10 mg total) by mouth 4 (four) times daily as needed for spasms.   etodolac (LODINE) 400 MG tablet Take 1 tablet (400 mg total) by mouth 2 (two) times daily as needed.   fexofenadine (ALLEGRA) 180 MG tablet Take 180 mg by mouth daily.   folic acid (FOLVITE) 1 MG tablet Take 1 mg by mouth daily.   HUMIRA PEN 40 MG/0.4ML PNKT Inject 40 mg into the muscle every 14 (fourteen) days.   hydroxychloroquine (PLAQUENIL) 200 MG tablet TAKE 1 TABLET BY MOUTH TWICE DAILY ON TUESDAY, THURSDAY, SATURDAY, SUNDAY AND 1 TABLET ONCE DAILY ON  MONDAY,WEDNESDAY,AND FRIDAY. TAKE WITH FOOD.   methotrexate 2.5 MG tablet Take 20 mg by mouth once a week. Caution:Chemotherapy. Protect from light.   Multiple Vitamin (MULTIVITAMIN) tablet Take 1 tablet by mouth daily.   Omega-3 Fatty Acids (OMEGA-3 EPA FISH OIL PO) Take 340-1,000 mg by mouth daily.   omeprazole (PRILOSEC) 20 MG capsule Take 20 mg by mouth daily.   No current facility-administered medications on file prior to visit.    Allergies:  Allergies  Allergen Reactions   Codeine    Lisinopril Other (See Comments)    Migraine   Sulfa Antibiotics     Social History:  Social History   Socioeconomic History   Marital status: Married    Spouse name: Not on file   Number of children: Not on file   Years of education: Not on file   Highest education level: Not on file  Occupational History   Occupation: retired  Tobacco Use   Smoking status: Never   Smokeless tobacco: Never   Tobacco comments:    never smoked, around lots of second hand  Vaping Use   Vaping Use: Never used  Substance and Sexual Activity   Alcohol use: Not Currently   Drug use: No   Sexual activity: Not Currently  Other Topics Concern   Not on file  Social History Narrative   Not on file   Social Determinants of Health   Financial Resource Strain: Low Risk  (08/28/2021)   Overall Financial Resource Strain (CARDIA)    Difficulty of Paying Living Expenses: Not hard at all  Food Insecurity: No Food Insecurity (08/28/2021)   Hunger Vital Sign    Worried About Running Out of Food in the Last Year: Never true    [place] in the Last Year: Never true  Transportation Needs: No Transportation Needs (08/28/2021)   PRAPARE - Hydrologist (Medical): No    Lack of Transportation (Non-Medical): No  Physical Activity: Insufficiently Active (08/28/2021)   Exercise Vital Sign    Days of Exercise per Week: 3 days    Minutes of Exercise per Session: 30 min  Stress: No  Stress Concern Present (08/28/2021)   [place]    Feeling of Stress : Not at all  Social Connections: Not on file  Intimate Partner Violence: Not on file   Social History   Tobacco Use  Smoking Status Never  Smokeless Tobacco Never  Tobacco Comments   never smoked, around lots of second hand   Social History   Substance and Sexual Activity  Alcohol Use Not Currently    Family History:  Family  History  Problem Relation Age of Onset   Breast cancer Paternal Grandmother    Cancer Paternal Grandmother        Breast   Heart disease Paternal Grandmother    Stroke Mother    Arthritis Mother        Osteo and rheumatoid   Hypertension Mother    Angina Mother    Vision loss Mother    Cancer Father        Multiple Myeloma   Gout Father    Cancer Sister        Breast   Heart disease Sister    Cancer Sister        Skin   Cancer Brother        prostate    Past medical history, surgical history, medications, allergies, family history and social history reviewed with patient today and changes made to appropriate areas of the chart.   Review of Systems  Constitutional:  Positive for diaphoresis. Negative for chills, fever, malaise/fatigue and weight loss.  HENT: Negative.    Eyes: Negative.   Respiratory: Negative.    Cardiovascular: Negative.   Gastrointestinal:  Positive for constipation and diarrhea. Negative for abdominal pain, blood in stool, heartburn, melena, nausea and vomiting.  Genitourinary: Negative.   Musculoskeletal: Negative.   Skin: Negative.   Neurological:  Positive for tingling (R pinky finger). Negative for dizziness, tremors, sensory change, speech change, focal weakness, seizures, loss of consciousness, weakness and headaches.  Endo/Heme/Allergies: Negative.   Psychiatric/Behavioral:  Negative for depression, hallucinations, memory loss, substance abuse and suicidal ideas. The patient  is nervous/anxious. The patient does not have insomnia.    All other ROS negative except what is listed above and in the HPI.      Objective:    BP (!) 146/80   Pulse 63   Ht _0  (1.702 m)   Wt 149 lb 8 oz (67.8 kg)   SpO2 97%   BMI 23.42 kg/m   Wt Readings from Last 3 Encounters:  08/12/22 149 lb 8 oz (67.8 kg)  02/09/22 149 lb 9.6 oz (67.9 kg)  10/23/21 148 lb 8 oz (67.4 kg)    Physical Exam Vitals and nursing note reviewed.  Constitutional:      General: She is not in acute distress.    Appearance: Normal appearance. She is not ill-appearing, toxic-appearing or diaphoretic.  HENT:     Head: Normocephalic and atraumatic.     Right Ear: Tympanic membrane, ear canal and external ear normal. There is no impacted cerumen.     Left Ear: Tympanic membrane, ear canal and external ear normal. There is no impacted cerumen.     Nose: Nose normal. No congestion or rhinorrhea.     Mouth/Throat:     Mouth: Mucous membranes are moist.     Pharynx: Oropharynx is clear. No oropharyngeal exudate or posterior oropharyngeal erythema.  Eyes:     General: No scleral icterus.       Right eye: No discharge.        Left eye: No discharge.     Extraocular Movements: Extraocular movements intact.     Conjunctiva/sclera: Conjunctivae normal.     Pupils: Pupils are equal, round, and reactive to light.  Neck:     Vascular: No carotid bruit.  Cardiovascular:     Rate and Rhythm: Normal rate and regular rhythm.     Pulses: Normal pulses.     Heart sounds: No murmur heard.    No friction  rub. No gallop.  Pulmonary:     Effort: Pulmonary effort is normal. No respiratory distress.     Breath sounds: Normal breath sounds. No stridor. No wheezing, rhonchi or rales.  Chest:     Chest wall: No tenderness.  Abdominal:     General: Abdomen is flat. Bowel sounds are normal. There is no distension.     Palpations: Abdomen is soft. There is no mass.     Tenderness: There is no abdominal tenderness.  There is no right CVA tenderness, left CVA tenderness, guarding or rebound.     Hernia: No hernia is present.  Genitourinary:    Comments: Breast and pelvic exams deferred with shared decision making Musculoskeletal:        General: No swelling, tenderness, deformity or signs of injury.     Cervical back: Normal range of motion and neck supple. No rigidity. No muscular tenderness.     Right lower leg: No edema.     Left lower leg: No edema.  Lymphadenopathy:     Cervical: No cervical adenopathy.  Skin:    General: Skin is warm and dry.     Capillary Refill: Capillary refill takes less than 2 seconds.     Coloration: Skin is not jaundiced or pale.     Findings: No bruising, erythema, lesion or rash.  Neurological:     General: No focal deficit present.     Mental Status: She is alert and oriented to person, place, and time. Mental status is at baseline.     Cranial Nerves: No cranial nerve deficit.     Sensory: No sensory deficit.     Motor: No weakness.     Coordination: Coordination normal.     Gait: Gait normal.     Deep Tendon Reflexes: Reflexes normal.  Psychiatric:        Mood and Affect: Mood normal.        Behavior: Behavior normal.        Thought Content: Thought content normal.        Judgment: Judgment normal.     Results for orders placed or performed in visit on 02/09/22  TSH  Result Value Ref Range   TSH 3.020 0.450 - 4.500 uIU/mL  Basic metabolic panel  Result Value Ref Range   Glucose 99 70 - 99 mg/dL   BUN 11 8 - 27 mg/dL   Creatinine, Ser 0.67 0.57 - 1.00 mg/dL   eGFR 91 >59 mL/min/1.73   BUN/Creatinine Ratio 16 12 - 28   Sodium 138 134 - 144 mmol/L   Potassium 4.3 3.5 - 5.2 mmol/L   Chloride 100 96 - 106 mmol/L   CO2 25 20 - 29 mmol/L   Calcium 9.4 8.7 - 10.3 mg/dL  B12  Result Value Ref Range   Vitamin B-12 668 232 - 1,245 pg/mL      Assessment & Plan:   Problem List Items Addressed This Visit       Cardiovascular and Mediastinum    Hypertension    Under good control on current regimen. Continue current regimen. Continue to monitor. Call with any concerns. Refills given. Labs drawn today.       Relevant Medications   metoprolol succinate (TOPROL-XL) 25 MG 24 hr tablet   losartan (COZAAR) 50 MG tablet   hydrochlorothiazide (HYDRODIURIL) 25 MG tablet   Other Relevant Orders   CBC with Differential/Platelet   Comprehensive metabolic panel   Urinalysis, Routine w reflex microscopic   Microalbumin, Urine Waived  Endocrine   Subclinical hypothyroidism    Rechecking labs today. Await results. Treat as needed.       Relevant Medications   metoprolol succinate (TOPROL-XL) 25 MG 24 hr tablet   Other Relevant Orders   TSH     Musculoskeletal and Integument   Rheumatoid arthritis ([place])    Continue to follow with rheumatology. Call with any concerns.       Relevant Medications   hydroxychloroquine (PLAQUENIL) 200 MG tablet   Other Visit Diagnoses     Routine general medical examination at a health care facility    -  Primary   Vaccines up to date. Screening labs checked today. Mammo scheduled. Colonoscopy N/A. DEXA up to date. Continue diet and exercise. Call with any concerns.    Screening for cardiovascular condition       Labs drawn today. Await results.    Relevant Orders   Lipid Panel w/o Chol/HDL Ratio   Need for influenza vaccination       Relevant Orders   Flu Vaccine QUAD High Dose(Fluad) (Completed)        Follow up plan: Return in about 4 weeks (around 09/09/2022) for BP follow up.   LABORATORY TESTING:  - Pap smear: not applicable  IMMUNIZATIONS:   - Tdap: Tetanus vaccination status reviewed: last tetanus booster within 10 years. - Influenza: Administered today - Pneumovax: Up to date - Prevnar: Up to date - COVID: Given elsewhere - HPV: Not applicable - Shingrix vaccine: Up to date  SCREENING: -Mammogram: Not applicable  - Colonoscopy: Not applicable  - Bone Density: Up to  date   PATIENT COUNSELING:   Advised to take 1 mg of folate supplement per day if capable of pregnancy.   Sexuality: Discussed sexually transmitted diseases, partner selection, use of condoms, avoidance of unintended pregnancy  and contraceptive alternatives.   Advised to avoid cigarette smoking.  I discussed with the patient that most people either abstain from alcohol or drink within safe limits (<=14/week and <=4 drinks/occasion for males, <=7/weeks and <= 3 drinks/occasion for females) and that the risk for alcohol disorders and other health effects rises proportionally with the number of drinks per week and how often a drinker exceeds daily limits.  Discussed cessation/primary prevention of drug use and availability of treatment for abuse.   Diet: Encouraged to adjust caloric intake to maintain  or achieve ideal body weight, to reduce intake of dietary saturated fat and total fat, to limit sodium intake by avoiding high sodium foods and not adding table salt, and to maintain adequate dietary potassium and calcium preferably from fresh fruits, vegetables, and low-fat dairy products.    stressed the importance of regular exercise  Injury prevention: Discussed safety belts, safety helmets, smoke detector, smoking near bedding or upholstery.   Dental health: Discussed importance of regular tooth brushing, flossing, and dental visits.    NEXT PREVENTATIVE PHYSICAL DUE IN 1 YEAR. Return in about 4 weeks (around 09/09/2022) for BP follow up.

## 2022-08-12 NOTE — Assessment & Plan Note (Signed)
Rechecking labs today. Await results. Treat as needed.  °

## 2022-08-12 NOTE — Assessment & Plan Note (Signed)
Under good control on current regimen. Continue current regimen. Continue to monitor. Call with any concerns. Refills given. Labs drawn today.   

## 2022-08-13 LAB — COMPREHENSIVE METABOLIC PANEL
ALT: 9 IU/L (ref 0–32)
AST: 14 IU/L (ref 0–40)
Albumin/Globulin Ratio: 1.3 (ref 1.2–2.2)
Albumin: 4.2 g/dL (ref 3.8–4.8)
Alkaline Phosphatase: 111 IU/L (ref 44–121)
BUN/Creatinine Ratio: 9 — ABNORMAL LOW (ref 12–28)
BUN: 13 mg/dL (ref 8–27)
Bilirubin Total: 0.7 mg/dL (ref 0.0–1.2)
CO2: 22 mmol/L (ref 20–29)
Calcium: 12.5 mg/dL — ABNORMAL HIGH (ref 8.7–10.3)
Chloride: 104 mmol/L (ref 96–106)
Creatinine, Ser: 1.44 mg/dL — ABNORMAL HIGH (ref 0.57–1.00)
Globulin, Total: 3.3 g/dL (ref 1.5–4.5)
Glucose: 98 mg/dL (ref 70–99)
Potassium: 4.5 mmol/L (ref 3.5–5.2)
Sodium: 141 mmol/L (ref 134–144)
Total Protein: 7.5 g/dL (ref 6.0–8.5)
eGFR: 38 mL/min/{1.73_m2} — ABNORMAL LOW (ref >59)

## 2022-08-13 LAB — CBC WITH DIFFERENTIAL/PLATELET
Basophils Absolute: 0.1 10*3/uL (ref 0.0–0.2)
Basos: 0 %
EOS (ABSOLUTE): 0.3 10*3/uL (ref 0.0–0.4)
Eos: 2 %
Hematocrit: 34 % (ref 34.0–46.6)
Hemoglobin: 11.3 g/dL (ref 11.1–15.9)
Immature Grans (Abs): 0 10*3/uL (ref 0.0–0.1)
Immature Granulocytes: 0 %
Lymphocytes Absolute: 1.9 10*3/uL (ref 0.7–3.1)
Lymphs: 13 %
MCH: 31.7 pg (ref 26.6–33.0)
MCHC: 33.2 g/dL (ref 31.5–35.7)
MCV: 96 fL (ref 79–97)
Monocytes Absolute: 0.8 10*3/uL (ref 0.1–0.9)
Monocytes: 6 %
Neutrophils Absolute: 10.8 10*3/uL — ABNORMAL HIGH (ref 1.4–7.0)
Neutrophils: 79 %
Platelets: 370 10*3/uL (ref 150–450)
RBC: 3.56 x10E6/uL — ABNORMAL LOW (ref 3.77–5.28)
RDW: 12 % (ref 11.7–15.4)
WBC: 13.9 10*3/uL — ABNORMAL HIGH (ref 3.4–10.8)

## 2022-08-13 LAB — LIPID PANEL W/O CHOL/HDL RATIO
Cholesterol, Total: 82 mg/dL — ABNORMAL LOW (ref 100–199)
HDL: 34 mg/dL — ABNORMAL LOW (ref >39)
LDL Chol Calc (NIH): 28 mg/dL (ref 0–99)
Triglycerides: 103 mg/dL (ref 0–149)
VLDL Cholesterol Cal: 20 mg/dL (ref 5–40)

## 2022-08-13 LAB — TSH: TSH: 0.177 u[IU]/mL — ABNORMAL LOW (ref 0.450–4.500)

## 2022-08-19 ENCOUNTER — Other Ambulatory Visit: Payer: Self-pay | Admitting: Family Medicine

## 2022-08-19 DIAGNOSIS — R7989 Other specified abnormal findings of blood chemistry: Secondary | ICD-10-CM

## 2022-08-19 DIAGNOSIS — E0590 Thyrotoxicosis, unspecified without thyrotoxic crisis or storm: Secondary | ICD-10-CM

## 2022-08-23 ENCOUNTER — Ambulatory Visit: Payer: Medicare Other

## 2022-08-26 ENCOUNTER — Ambulatory Visit
Admission: RE | Admit: 2022-08-26 | Discharge: 2022-08-26 | Disposition: A | Discharge status: Home / Self Care | Payer: Medicare Other | Source: Ambulatory Visit | Attending: Family Medicine | Admitting: Family Medicine

## 2022-08-26 DIAGNOSIS — Z1231 Encounter for screening mammogram for malignant neoplasm of breast: Secondary | ICD-10-CM | POA: Insufficient documentation

## 2022-08-30 ENCOUNTER — Ambulatory Visit: Payer: Medicare Other

## 2022-08-31 ENCOUNTER — Ambulatory Visit (INDEPENDENT_AMBULATORY_CARE_PROVIDER_SITE_OTHER): Payer: Medicare Other | Admitting: *Deleted

## 2022-08-31 DIAGNOSIS — Z0000 Encounter for general adult medical examination without abnormal findings: Secondary | ICD-10-CM | POA: Diagnosis not present

## 2022-08-31 NOTE — Patient Instructions (Signed)
Barbara Tucker , Thank you for taking time to come for your Medicare Wellness Visit. I appreciate your ongoing commitment to your health goals. Please review the following plan we discussed and let me know if I can assist you in the future.   These are the goals we discussed:  Goals      Patient Stated     08/15/2020, stay healthy     Patient Stated     08/28/2021, stay healthy and keeping an eye on BP     Patient Stated     Continue current lifestyle        This is a list of the screening recommended for you and due dates:  Health Maintenance  Topic Date Due   COVID-19 Vaccine (6 - Pfizer risk series) 11/11/2021   DEXA scan (bone density measurement)  09/01/2023*   Medicare Annual Wellness Visit  10/01/2023   Tetanus Vaccine  07/25/2028   Pneumonia Vaccine  Completed   Flu Shot  Completed   Hepatitis C Screening: USPSTF Recommendation to screen - Ages 18-79 yo.  Completed   Zoster (Shingles) Vaccine  Completed   HPV Vaccine  Aged Out   Colon Cancer Screening  Discontinued  *Topic was postponed. The date shown is not the original due date.    Advanced directives: bring a copy  Conditions/risks identified:      Preventive Care 6 Years and Older, Female Preventive care refers to lifestyle choices and visits with your health care provider that can promote health and wellness. What does preventive care include? A yearly physical exam. This is also called an annual well check. Dental exams once or twice a year. Routine eye exams. Ask your health care provider how often you should have your eyes checked. Personal lifestyle choices, including: Daily care of your teeth and gums. Regular physical activity. Eating a healthy diet. Avoiding tobacco and drug use. Limiting alcohol use. Practicing safe sex. Taking low-dose aspirin every day. Taking vitamin and mineral supplements as recommended by your health care provider. What happens during an annual well check? The services  and screenings done by your health care provider during your annual well check will depend on your age, overall health, lifestyle risk factors, and family history of disease. Counseling  Your health care provider may ask you questions about your: Alcohol use. Tobacco use. Drug use. Emotional well-being. Home and relationship well-being. Sexual activity. Eating habits. History of falls. Memory and ability to understand (cognition). Work and work Statistician. Reproductive health. Screening  You may have the following tests or measurements: Height, weight, and BMI. Blood pressure. Lipid and cholesterol levels. These may be checked every 5 years, or more frequently if you are over 10 years old. Skin check. Lung cancer screening. You may have this screening every year starting at age 110 if you have a 30-pack-year history of smoking and currently smoke or have quit within the past 15 years. Fecal occult blood test (FOBT) of the stool. You may have this test every year starting at age 62. Flexible sigmoidoscopy or colonoscopy. You may have a sigmoidoscopy every 5 years or a colonoscopy every 10 years starting at age 50. Hepatitis C blood test. Hepatitis B blood test. Sexually transmitted disease (STD) testing. Diabetes screening. This is done by checking your blood sugar (glucose) after you have not eaten for a while (fasting). You may have this done every 1-3 years. Bone density scan. This is done to screen for osteoporosis. You may have this done starting at  age 51. Mammogram. This may be done every 1-2 years. Talk to your health care provider about how often you should have regular mammograms. Talk with your health care provider about your test results, treatment options, and if necessary, the need for more tests. Vaccines  Your health care provider may recommend certain vaccines, such as: Influenza vaccine. This is recommended every year. Tetanus, diphtheria, and acellular pertussis  (Tdap, Td) vaccine. You may need a Td booster every 10 years. Zoster vaccine. You may need this after age 67. Pneumococcal 13-valent conjugate (PCV13) vaccine. One dose is recommended after age 80. Pneumococcal polysaccharide (PPSV23) vaccine. One dose is recommended after age 80. Talk to your health care provider about which screenings and vaccines you need and how often you need them. This information is not intended to replace advice given to you by your health care provider. Make sure you discuss any questions you have with your health care provider. Document Released: 11/21/2015 Document Revised: 07/14/2016 Document Reviewed: 08/26/2015 Elsevier Interactive Patient Education  2017 [place] Prevention in the Home Falls can cause injuries. They can happen to people of all ages. There are many things you can do to make your home safe and to help prevent falls. What can I do on the outside of my home? Regularly fix the edges of walkways and driveways and fix any cracks. Remove anything that might make you trip as you walk through a door, such as a raised step or threshold. Trim any bushes or trees on the path to your home. Use bright outdoor lighting. Clear any walking paths of anything that might make someone trip, such as rocks or tools. Regularly check to see if handrails are loose or broken. Make sure that both sides of any steps have handrails. Any raised decks and porches should have guardrails on the edges. Have any leaves, snow, or ice cleared regularly. Use sand or salt on walking paths during winter. Clean up any spills in your garage right away. This includes oil or grease spills. What can I do in the bathroom? Use night lights. Install grab bars by the toilet and in the tub and shower. Do not use towel bars as grab bars. Use non-skid mats or decals in the tub or shower. If you need to sit down in the shower, use a plastic, non-slip stool. Keep the floor dry. Clean  up any water that spills on the floor as soon as it happens. Remove soap buildup in the tub or shower regularly. Attach bath mats securely with double-sided non-slip rug tape. Do not have throw rugs and other things on the floor that can make you trip. What can I do in the bedroom? Use night lights. Make sure that you have a light by your bed that is easy to reach. Do not use any sheets or blankets that are too big for your bed. They should not hang down onto the floor. Have a firm chair that has side arms. You can use this for support while you get dressed. Do not have throw rugs and other things on the floor that can make you trip. What can I do in the kitchen? Clean up any spills right away. Avoid walking on wet floors. Keep items that you use a lot in easy-to-reach places. If you need to reach something above you, use a strong step stool that has a grab bar. Keep electrical cords out of the way. Do not use floor polish or wax that makes floors  slippery. If you must use wax, use non-skid floor wax. Do not have throw rugs and other things on the floor that can make you trip. What can I do with my stairs? Do not leave any items on the stairs. Make sure that there are handrails on both sides of the stairs and use them. Fix handrails that are broken or loose. Make sure that handrails are as long as the stairways. Check any carpeting to make sure that it is firmly attached to the stairs. Fix any carpet that is loose or worn. Avoid having throw rugs at the top or bottom of the stairs. If you do have throw rugs, attach them to the floor with carpet tape. Make sure that you have a light switch at the top of the stairs and the bottom of the stairs. If you do not have them, ask someone to add them for you. What else can I do to help prevent falls? Wear shoes that: Do not have high heels. Have rubber bottoms. Are comfortable and fit you well. Are closed at the toe. Do not wear sandals. If you  use a stepladder: Make sure that it is fully opened. Do not climb a closed stepladder. Make sure that both sides of the stepladder are locked into place. Ask someone to hold it for you, if possible. Clearly mark and make sure that you can see: Any grab bars or handrails. First and last steps. Where the edge of each step is. Use tools that help you move around (mobility aids) if they are needed. These include: Canes. Walkers. Scooters. Crutches. Turn on the lights when you go into a dark area. Replace any light bulbs as soon as they burn out. Set up your furniture so you have a clear path. Avoid moving your furniture around. If any of your floors are uneven, fix them. If there are any pets around you, be aware of where they are. Review your medicines with your doctor. Some medicines can make you feel dizzy. This can increase your chance of falling. Ask your doctor what other things that you can do to help prevent falls. This information is not intended to replace advice given to you by your health care provider. Make sure you discuss any questions you have with your health care provider. Document Released: 08/21/2009 Document Revised: 04/01/2016 Document Reviewed: 11/29/2014 Elsevier Interactive Patient Education  2017 [employer].

## 2022-08-31 NOTE — Progress Notes (Signed)
Subjective:   Barbara Tucker is a 76 y.o. female who presents for Medicare Annual (Subsequent) preventive examination.  I connected with  Barbara Tucker on 08/31/22 by a telephone enabled telemedicine application and verified that I am speaking with the correct person using two identifiers.   I discussed the limitations of evaluation and management by telemedicine. The patient expressed understanding and agreed to proceed.  Patient location: home  Provider location:  Tele-health-home    Review of Systems     Cardiac Risk Factors include: advanced age (>20mn, >>73women);hypertension;family history of premature cardiovascular disease     Objective:    Today's Vitals   There is no height or weight on file to calculate BMI.     08/31/2022   12:40 PM 08/28/2021    2:31 PM 07/09/2021    9:04 PM 07/09/2021   12:19 PM 08/15/2020    1:46 PM 09/08/2016    9:52 AM 02/03/2016   10:18 AM  Advanced Directives  Does Patient Have a Medical Advance Directive? Yes Yes Yes Yes Yes Yes Yes  Type of AParamedicof [place]artonvilleLiving will [place]Living will [place]Living will [place]Living will Healthcare Power of [place] Does patient want to make changes to medical advance directive?   No - Patient declined      Copy of HRothschildin Chart? No - copy requested Yes - validated most recent copy scanned in chart (See row information) No - copy requested  No - copy requested  No - copy requested    Current Medications (verified) Outpatient Encounter Medications as of 08/31/2022  Medication Sig   ascorbic acid (VITAMIN C) 500 MG tablet Take 500 mg by mouth daily.   calcium carbonate (OSCAL) 1500 (600 Ca) MG TABS tablet Take 600 mg of elemental calcium by mouth 2 (two) times daily with a meal.   Calcium Polycarbophil  (FIBER-CAPS PO) Take by mouth.   dicyclomine (BENTYL) 10 MG capsule Take 1 capsule (10 mg total) by mouth 4 (four) times daily as needed for spasms.   estradiol (ESTRACE) 1 MG tablet Take 1 tablet (1 mg total) by mouth daily.   etodolac (LODINE) 400 MG tablet Take 1 tablet (400 mg total) by mouth 2 (two) times daily as needed.   fexofenadine (ALLEGRA) 180 MG tablet Take 180 mg by mouth daily.   folic acid (FOLVITE) 1 MG tablet Take 1 mg by mouth daily.   HUMIRA PEN 40 MG/0.4ML PNKT Inject 40 mg into the muscle every 14 (fourteen) days.   hydrochlorothiazide (HYDRODIURIL) 25 MG tablet Take 1 tablet (25 mg total) by mouth daily.   hydroxychloroquine (PLAQUENIL) 200 MG tablet TAKE 1 TABLET BY MOUTH TWICE DAILY ON TUESDAY, THURSDAY, SATURDAY, SUNDAY AND 1 TABLET ONCE DAILY ON MONDAY,WEDNESDAY,AND FRIDAY. TAKE WITH FOOD.   losartan (COZAAR) 50 MG tablet Take 1 tablet (50 mg total) by mouth daily.   methotrexate 2.5 MG tablet Take 20 mg by mouth once a week. Caution:Chemotherapy. Protect from light.   metoprolol succinate (TOPROL-XL) 25 MG 24 hr tablet Take 1 tablet (25 mg total) by mouth daily.   Multiple Vitamin (MULTIVITAMIN) tablet Take 1 tablet by mouth daily.   Omega-3 Fatty Acids (OMEGA-3 EPA FISH OIL PO) Take 340-1,000 mg by mouth daily.   omeprazole (PRILOSEC) 20 MG capsule Take 20 mg by mouth daily.   No facility-administered encounter medications on  file as of 08/31/2022.    Allergies (verified) Codeine, Lisinopril, and Sulfa antibiotics   History: Past Medical History:  Diagnosis Date   Allergic rhinitis    Allergy    Endometriosis    Heart murmur 1975   minimal, seen on ultrasound long ago   Hypertension    Hypothyroidism    IBS (irritable bowel syndrome)    Migraine    Osteoarthritis    Rheumatoid arthritis ([place])    Past Surgical History:  Procedure Laterality Date   APPENDECTOMY     BREAST EXCISIONAL BIOPSY Right 1975   excisional - negative   BREAST SURGERY  1975?    benign cyst removed   COLONOSCOPY WITH PROPOFOL N/A 02/03/2016   Procedure: COLONOSCOPY WITH PROPOFOL;  Surgeon: Lollie Sails, MD;  Location: [facility] ENDOSCOPY;  Service: Endoscopy;  Laterality: N/A;   LEFT HEART CATH AND CORONARY ANGIOGRAPHY N/A 07/10/2021   Procedure: LEFT HEART CATH AND CORONARY ANGIOGRAPHY;  Surgeon: Wellington Hampshire, MD;  Location: [facility];  Service: Cardiovascular;  Laterality: N/A;   OOPHORECTOMY Bilateral    TONSILLECTOMY     TOTAL ABDOMINAL HYSTERECTOMY W/ BILATERAL SALPINGOOPHORECTOMY     TUBAL LIGATION     Family History  Problem Relation Age of Onset   Breast cancer Paternal Grandmother    Cancer Paternal Grandmother        Breast   Heart disease Paternal Grandmother    Stroke Mother    Arthritis Mother        Osteo and rheumatoid   Hypertension Mother    Angina Mother    Vision loss Mother    Cancer Father        Multiple Myeloma   Gout Father    Cancer Sister        Breast   Heart disease Sister    Cancer Sister        Skin   Cancer Brother        prostate   Social History   Socioeconomic History   Marital status: Married    Spouse name: Not on file   Number of children: Not on file   Years of education: Not on file   Highest education level: Not on file  Occupational History   Occupation: retired  Tobacco Use   Smoking status: Never   Smokeless tobacco: Never   Tobacco comments:    never smoked, around lots of second hand  Vaping Use   Vaping Use: Never used  Substance and Sexual Activity   Alcohol use: Not Currently   Drug use: No   Sexual activity: Not Currently  Other Topics Concern   Not on file  Social History Narrative   Not on file   Social Determinants of Health   Financial Resource Strain: Low Risk  (08/31/2022)   Overall Financial Resource Strain (CARDIA)    Difficulty of Paying Living Expenses: Not hard at all  Food Insecurity: No Food Insecurity (08/31/2022)   Hunger Vital Sign    Worried  About Running Out of Food in the Last Year: Never true    Ran Out of Food in the Last Year: Never true  Transportation Needs: No Transportation Needs (08/31/2022)   PRAPARE - Hydrologist (Medical): No    Lack of Transportation (Non-Medical): No  Physical Activity: Insufficiently Active (08/31/2022)   Exercise Vital Sign    Days of Exercise per Week: 4 days    Minutes of Exercise per Session:  30 min  Stress: No Stress Concern Present (08/31/2022)   [employer] [place]    Feeling of Stress : Not at all  Social Connections: Socially Integrated (08/31/2022)   Social Connection and Isolation Panel [NHANES]    Frequency of Communication with Friends and Family: More than three times a week    Frequency of Social Gatherings with Friends and Family: Once a week    Attends Religious Services: More than 4 times per year    Active Member of [employer] or Organizations: Yes    Attends Music therapist: More than 4 times per year    Marital Status: Married    Tobacco Counseling Counseling given: Not Answered Tobacco comments: never smoked, around lots of second hand   Clinical Intake:  Pre-visit preparation completed: Yes  Pain : No/denies pain     Diabetes: No  How often do you need to have someone help you when you read instructions, pamphlets, or other written materials from your doctor or pharmacy?: 1 - Never  Diabetic?  no  Interpreter Needed?: No  Information entered by :: Leroy Kennedy LPN   Activities of Daily Living    08/31/2022   12:40 PM 08/12/2022    1:38 PM  In your present state of health, do you have any difficulty performing the following activities:  Hearing? 0 0  Vision? 0 0  Difficulty concentrating or making decisions? 0 0  Walking or climbing stairs? 0 0  Dressing or bathing? 0 0  Doing errands, shopping? 0 0  Preparing Food and eating ? N   Using the Toilet?  N   In the past six months, have you accidently leaked urine? N   Do you have problems with loss of bowel control? N   Managing your Medications? N   Managing your Finances? N   Housekeeping or managing your Housekeeping? N     Patient Care Team: Valerie Roys, DO as PCP - General (Family Medicine)  Indicate any recent Medical Services you may have received from other than [facility] providers in the past year (date may be approximate).     Assessment:   This is a routine wellness examination for [facility].  Hearing/Vision screen Hearing Screening - Comments:: No trouble hearing Vision Screening - Comments:: Up to date [facility]  Dietary issues and exercise activities discussed: Current Exercise Habits: Structured exercise class, Time (Minutes): 30, Frequency (Times/Week): 4, Weekly Exercise (Minutes/Week): 120, Intensity: Mild   Goals Addressed             This Visit's Progress    Patient Stated   On track    08/15/2020, stay healthy     Patient Stated       Continue current lifestyle       Depression Screen    08/31/2022   12:42 PM 08/12/2022    1:36 PM 02/09/2022   10:07 AM 08/28/2021    2:32 PM 08/15/2020    1:46 PM 08/05/2020    2:10 PM 07/27/2019    9:49 AM  PHQ 2/9 Scores  PHQ - 2 Score 0 0 0 0 0 0 1  PHQ- 9 Score 0 '1 2    3    ' Fall Risk    08/31/2022   12:36 PM 08/12/2022    1:35 PM 08/28/2021    2:32 PM 08/15/2020    1:46 PM 08/05/2020    2:10 PM  Fall Risk   Falls in the past  year? 0 0 0 0 0  Number falls in past yr: 0 0   0  Injury with Fall? 0 0   0  Risk for fall due to :  No Fall Risks Medication side effect Medication side effect No Fall Risks  Follow up Falls evaluation completed;Education provided;Falls prevention discussed Falls evaluation completed Falls evaluation completed;Education provided;Falls prevention discussed Falls evaluation completed;Education provided;Falls prevention discussed Falls evaluation completed    FALL RISK PREVENTION  PERTAINING TO THE HOME:  Any stairs in or around the home? Yes  If so, are there any without handrails? No  Home free of loose throw rugs in walkways, pet beds, electrical cords, etc? Yes  Adequate lighting in your home to reduce risk of falls? Yes   ASSISTIVE DEVICES UTILIZED TO PREVENT FALLS:  Life alert? No  Use of a cane, walker or w/c? No  Grab bars in the bathroom? Yes  Shower chair or bench in shower? Yes  Elevated toilet seat or a handicapped toilet? Yes   TIMED UP AND GO:  Was the test performed? No .   Cognitive Function:        08/31/2022   12:38 PM 08/28/2021    2:33 PM 08/15/2020    1:48 PM 07/27/2019    9:39 AM 07/25/2018    8:21 AM  6CIT Screen  What Year? 0 points 0 points 0 points 0 points 0 points  What month? 0 points 0 points 0 points 0 points 0 points  What time? 0 points 0 points 0 points 0 points 0 points  Count back from 20 0 points 0 points 0 points 0 points 0 points  Months in reverse 0 points 0 points 0 points 2 points 0 points  Repeat phrase 0 points 0 points 0 points 0 points 0 points  Total Score 0 points 0 points 0 points 2 points 0 points    Immunizations Immunization History  Administered Date(s) Administered   Fluad Quad(high Dose 65+) 07/27/2019, 08/05/2020, 08/11/2021, 08/12/2022   Influenza, High Dose Seasonal PF 08/08/2018   Influenza-Unspecified 07/24/2015, 09/17/2016   PFIZER Comirnaty(Gray Top)Covid-19 Tri-Sucrose Vaccine 03/13/2021   PFIZER(Purple Top)SARS-COV-2 Vaccination 01/04/2020, 01/29/2020, 08/28/2020, 09/16/2021   Pneumococcal Conjugate-13 09/06/2012, 11/30/2016   Pneumococcal Polysaccharide-23 07/25/2018   Td 07/25/2018   Zoster Recombinat (Shingrix) 02/10/2021, 04/13/2021   Zoster, Live 09/03/2010    TDAP status: Up to date  Flu Vaccine status: Up to date  Pneumococcal vaccine status: Up to date  Covid-19 vaccine status: Information provided on how to obtain vaccines.   Qualifies for Shingles Vaccine? Yes    Zostavax completed Yes   Shingrix Completed?: Yes  Screening Tests Health Maintenance  Topic Date Due   COVID-19 Vaccine (6 - Pfizer risk series) 11/11/2021   DEXA SCAN  09/01/2023 (Originally 08/21/2022)   Medicare Annual Wellness (AWV)  10/01/2023   TETANUS/TDAP  07/25/2028   Pneumonia Vaccine 38+ Years old  Completed   INFLUENZA VACCINE  Completed   Hepatitis C Screening  Completed   Zoster Vaccines- Shingrix  Completed   HPV VACCINES  Aged Out   COLONOSCOPY (Pts 45-48yr Insurance coverage will need to be confirmed)  Discontinued    Health Maintenance  Health Maintenance Due  Topic Date Due   COVID-19 Vaccine (6 - Pfizer risk series) 11/11/2021    Colorectal cancer screening: No longer required.   Mammogram status: Completed 2023. Repeat every year  Bone Density  will have next year with Mammogram  Lung Cancer Screening: (Low Dose CT  Chest recommended if Age 76-80 years, 30 pack-year currently smoking OR have quit w/in 15years.) does not qualify.   Lung Cancer Screening Referral:   Additional Screening:  Hepatitis C Screening: does not qualify; Completed 2019  Vision Screening: Recommended annual ophthalmology exams for early detection of glaucoma and other disorders of the eye. Is the patient up to date with their annual eye exam?  Yes  Who is the provider or what is the name of the office in which the patient attends annual eye exams? [facility] If pt is not established with a provider, would they like to be referred to a provider to establish care? No .   Dental Screening: Recommended annual dental exams for proper oral hygiene  Community Resource Referral / Chronic Care Management: CRR required this visit?  No   CCM required this visit?  No      Plan:     I have personally reviewed and noted the following in the patient's chart:   Medical and social history Use of alcohol, tobacco or illicit drugs  Current medications and supplements including  opioid prescriptions. Patient is not currently taking opioid prescriptions. Functional ability and status Nutritional status Physical activity Advanced directives List of other physicians Hospitalizations, surgeries, and ER visits in previous 12 months Vitals Screenings to include cognitive, depression, and falls Referrals and appointments  In addition, I have reviewed and discussed with patient certain preventive protocols, quality metrics, and best practice recommendations. A written personalized care plan for preventive services as well as general preventive health recommendations were provided to patient.     Leroy Kennedy, LPN   [id number]   Nurse Notes:

## 2022-09-09 ENCOUNTER — Ambulatory Visit (INDEPENDENT_AMBULATORY_CARE_PROVIDER_SITE_OTHER): Payer: Medicare Other | Admitting: Family Medicine

## 2022-09-09 ENCOUNTER — Encounter: Payer: Self-pay | Admitting: Family Medicine

## 2022-09-09 VITALS — BP 126/74 | HR 61 | Temp 98.6°F | Wt 150.3 lb

## 2022-09-09 DIAGNOSIS — I10 Essential (primary) hypertension: Secondary | ICD-10-CM

## 2022-09-09 DIAGNOSIS — M79604 Pain in right leg: Secondary | ICD-10-CM

## 2022-09-09 MED ORDER — HYDROCHLOROTHIAZIDE 25 MG PO TABS: 25.0000 mg | ORAL_TABLET | Freq: Every day | ORAL | 1 refills | Status: DC

## 2022-09-09 NOTE — Assessment & Plan Note (Signed)
Under good control on current regimen. Continue current regimen. Continue to monitor. Call with any concerns. Refills given. Labs drawn today.   

## 2022-09-09 NOTE — Progress Notes (Signed)
BP 126/74   Pulse 61   Temp 98.6 F (37 C)   Wt 150 lb 4.8 oz (68.2 kg)   SpO2 97%   BMI 23.54 kg/m    Subjective:    Patient ID: Barbara Tucker, female    DOB: 1946-06-03, 76 y.o.   MRN: [id number]  HPI: Barbara Tucker is a 76 y.o. female  Chief Complaint  Patient presents with   Hypertension   HYPERTENSION  Hypertension status: controlled  Satisfied with current treatment? yes Duration of hypertension: chronic BP monitoring frequency:  a few times a week BP medication side effects:  yes- having a bit more diarrhea Medication compliance: excellent compliance Previous BP meds:metoprolol, losartan, HCTZ Aspirin: no Recurrent headaches: no Visual changes: no Palpitations: no Dyspnea: no Chest pain: no Lower extremity edema: no Dizzy/lightheaded: no  Relevant past medical, surgical, family and social history reviewed and updated as indicated. Interim medical history since our last visit reviewed. Allergies and medications reviewed and updated.  Review of Systems  Constitutional: Negative.   Respiratory: Negative.    Cardiovascular: Negative.   Gastrointestinal:  Positive for diarrhea. Negative for abdominal distention, abdominal pain, anal bleeding, blood in stool, constipation, nausea, rectal pain and vomiting.  Musculoskeletal: Negative.   Skin: Negative.   Neurological: Negative.   Psychiatric/Behavioral: Negative.      Per HPI unless specifically indicated above     Objective:    BP 126/74   Pulse 61   Temp 98.6 F (37 C)   Wt 150 lb 4.8 oz (68.2 kg)   SpO2 97%   BMI 23.54 kg/m   Wt Readings from Last 3 Encounters:  09/09/22 150 lb 4.8 oz (68.2 kg)  08/12/22 149 lb 8 oz (67.8 kg)  02/09/22 149 lb 9.6 oz (67.9 kg)    Physical Exam Vitals and nursing note reviewed.  Constitutional:      General: She is not in acute distress.    Appearance: Normal appearance. She is well-developed and normal weight.  HENT:     Head:  Normocephalic and atraumatic.     Right Ear: Hearing and external ear normal.     Left Ear: Hearing and external ear normal.     Nose: Nose normal.     Mouth/Throat:     Mouth: Mucous membranes are moist.     Pharynx: Oropharynx is clear.  Eyes:     General: Lids are normal. No scleral icterus.       Right eye: No discharge.        Left eye: No discharge.     Conjunctiva/sclera: Conjunctivae normal.  Pulmonary:     Effort: Pulmonary effort is normal. No respiratory distress.  Musculoskeletal:        General: Normal range of motion.  Skin:    Coloration: Skin is not jaundiced or pale.     Findings: No bruising, erythema, lesion or rash.  Neurological:     General: No focal deficit present.     Mental Status: She is alert and oriented to person, place, and time. Mental status is at baseline.  Psychiatric:        Mood and Affect: Mood normal.        Speech: Speech normal.        Behavior: Behavior normal.        Thought Content: Thought content normal.        Judgment: Judgment normal.     Results for orders placed or performed in visit on 08/12/22  CBC with Differential/Platelet  Result Value Ref Range   WBC 13.9 (H) 3.4 - 10.8 x10E3/uL   RBC 3.56 (L) 3.77 - 5.28 x10E6/uL   Hemoglobin 11.3 11.1 - 15.9 g/dL   Hematocrit 34.0 34.0 - 46.6 %   MCV 96 79 - 97 fL   MCH 31.7 26.6 - 33.0 pg   MCHC 33.2 31.5 - 35.7 g/dL   RDW 12.0 11.7 - 15.4 %   Platelets 370 150 - 450 x10E3/uL   Neutrophils 79 Not Estab. %   Lymphs 13 Not Estab. %   Monocytes 6 Not Estab. %   Eos 2 Not Estab. %   Basos 0 Not Estab. %   Neutrophils Absolute 10.8 (H) 1.4 - 7.0 x10E3/uL   Lymphocytes Absolute 1.9 0.7 - 3.1 x10E3/uL   Monocytes Absolute 0.8 0.1 - 0.9 x10E3/uL   EOS (ABSOLUTE) 0.3 0.0 - 0.4 x10E3/uL   Basophils Absolute 0.1 0.0 - 0.2 x10E3/uL   Immature Granulocytes 0 Not Estab. %   Immature Grans (Abs) 0.0 0.0 - 0.1 x10E3/uL  Comprehensive metabolic panel  Result Value Ref Range   Glucose  98 70 - 99 mg/dL   BUN 13 8 - 27 mg/dL   Creatinine, Ser 1.44 (H) 0.57 - 1.00 mg/dL   eGFR 38 (L) >59 mL/min/1.73   BUN/Creatinine Ratio 9 (L) 12 - 28   Sodium 141 134 - 144 mmol/L   Potassium 4.5 3.5 - 5.2 mmol/L   Chloride 104 96 - 106 mmol/L   CO2 22 20 - 29 mmol/L   Calcium 12.5 (H) 8.7 - 10.3 mg/dL   Total Protein 7.5 6.0 - 8.5 g/dL   Albumin 4.2 3.8 - 4.8 g/dL   Globulin, Total 3.3 1.5 - 4.5 g/dL   Albumin/Globulin Ratio 1.3 1.2 - 2.2   Bilirubin Total 0.7 0.0 - 1.2 mg/dL   Alkaline Phosphatase 111 44 - 121 IU/L   AST 14 0 - 40 IU/L   ALT 9 0 - 32 IU/L  Lipid Panel w/o Chol/HDL Ratio  Result Value Ref Range   Cholesterol, Total 82 (L) 100 - 199 mg/dL   Triglycerides 103 0 - 149 mg/dL   HDL 34 (L) >39 mg/dL   VLDL Cholesterol Cal 20 5 - 40 mg/dL   LDL Chol Calc (NIH) 28 0 - 99 mg/dL  Urinalysis, Routine w reflex microscopic  Result Value Ref Range   Specific Gravity, UA 1.010 1.005 - 1.030   pH, UA 6.0 5.0 - 7.5   Color, UA Yellow Yellow   Appearance Ur Clear Clear   Leukocytes,UA Negative Negative   Protein,UA Negative Negative/Trace   Glucose, UA Negative Negative   Ketones, UA Negative Negative   RBC, UA Negative Negative   Bilirubin, UA Negative Negative   Urobilinogen, Ur 0.2 0.2 - 1.0 mg/dL   Nitrite, UA Negative Negative  TSH  Result Value Ref Range   TSH 0.177 (L) 0.450 - 4.500 uIU/mL  Microalbumin, Urine Waived  Result Value Ref Range   Microalb, Ur Waived 10 0 - 19 mg/L   Creatinine, Urine Waived 50 10 - 300 mg/dL   Microalb/Creat Ratio <30 <30 mg/g      Assessment & Plan:   Problem List Items Addressed This Visit       Cardiovascular and Mediastinum   Hypertension - Primary    Under good control on current regimen. Continue current regimen. Continue to monitor. Call with any concerns. Refills given. Labs drawn today.  Relevant Medications   hydrochlorothiazide (HYDRODIURIL) 25 MG tablet   Other Relevant Orders   Basic metabolic panel    Other Visit Diagnoses     Right leg pain       Having shooting pain in her R thigh- will try voltaren. If not improving consider imaging.        Follow up plan: Return in about 5 months (around 02/08/2023).

## 2022-09-10 LAB — BASIC METABOLIC PANEL
BUN/Creatinine Ratio: 12 (ref 12–28)
BUN: 9 mg/dL (ref 8–27)
CO2: 26 mmol/L (ref 20–29)
Calcium: 9.5 mg/dL (ref 8.7–10.3)
Chloride: 93 mmol/L — ABNORMAL LOW (ref 96–106)
Creatinine, Ser: 0.75 mg/dL (ref 0.57–1.00)
Glucose: 121 mg/dL — ABNORMAL HIGH (ref 70–99)
Potassium: 4.2 mmol/L (ref 3.5–5.2)
Sodium: 134 mmol/L (ref 134–144)
eGFR: 82 mL/min/{1.73_m2} (ref >59)

## 2022-09-21 DIAGNOSIS — Z79899 Other long term (current) drug therapy: Secondary | ICD-10-CM | POA: Diagnosis not present

## 2022-09-21 DIAGNOSIS — M059 Rheumatoid arthritis with rheumatoid factor, unspecified: Secondary | ICD-10-CM | POA: Diagnosis not present

## 2022-09-21 DIAGNOSIS — M810 Age-related osteoporosis without current pathological fracture: Secondary | ICD-10-CM | POA: Diagnosis not present

## 2022-09-23 ENCOUNTER — Encounter: Payer: Self-pay | Admitting: Family Medicine

## 2022-10-05 DIAGNOSIS — M8588 Other specified disorders of bone density and structure, other site: Secondary | ICD-10-CM | POA: Diagnosis not present

## 2022-10-05 LAB — HM DEXA SCAN

## 2022-10-06 ENCOUNTER — Encounter: Payer: Self-pay | Admitting: Family Medicine

## 2022-10-19 DIAGNOSIS — Z79899 Other long term (current) drug therapy: Secondary | ICD-10-CM | POA: Diagnosis not present

## 2022-10-26 ENCOUNTER — Encounter: Payer: Self-pay | Admitting: Cardiovascular Disease

## 2022-10-26 ENCOUNTER — Ambulatory Visit: Payer: Medicare Other | Attending: Cardiovascular Disease | Admitting: Cardiovascular Disease

## 2022-10-26 VITALS — BP 136/72 | HR 58 | Ht 67.0 in | Wt 152.4 lb

## 2022-10-26 DIAGNOSIS — I10 Essential (primary) hypertension: Secondary | ICD-10-CM | POA: Diagnosis not present

## 2022-10-26 DIAGNOSIS — I428 Other cardiomyopathies: Secondary | ICD-10-CM

## 2022-10-26 DIAGNOSIS — I5181 Takotsubo syndrome: Secondary | ICD-10-CM | POA: Diagnosis not present

## 2022-10-26 DIAGNOSIS — I5022 Chronic systolic (congestive) heart failure: Secondary | ICD-10-CM | POA: Diagnosis not present

## 2022-10-26 MED ORDER — HYDROCHLOROTHIAZIDE 25 MG PO TABS: 25.0000 mg | ORAL_TABLET | Freq: Every day | ORAL | 3 refills | Status: DC

## 2022-10-26 MED ORDER — LOSARTAN POTASSIUM 50 MG PO TABS: 50.0000 mg | ORAL_TABLET | Freq: Every day | ORAL | 3 refills | Status: DC

## 2022-10-26 MED ORDER — METOPROLOL SUCCINATE ER 25 MG PO TB24: 25.0000 mg | ORAL_TABLET | Freq: Every day | ORAL | 3 refills | Status: DC

## 2022-10-26 NOTE — Patient Instructions (Signed)
Medication Instructions:  No changes  If you need a refill on your cardiac medications before your next appointment, please call your pharmacy.   Lab work: No new labs needed  Testing/Procedures: No new testing needed  Follow-Up: At [facility], you and your health needs are our priority.  As part of our continuing mission to provide you with exceptional heart care, we have created designated Provider Care Teams.  These Care Teams include your primary Cardiologist (physician) and Advanced Practice Providers (APPs -  Physician Assistants and Nurse Practitioners) who all work together to provide you with the care you need, when you need it.  You will need a follow up appointment in 12 months  Providers on your designated Care Team:   Christopher Berge, NP Ryan Dunn, PA-C Cadence Furth, PA-C  COVID-19 Vaccine Information can be found at: [email or web address] For questions related to vaccine distribution or appointments, please email [email or web address] or call [phone].   

## 2022-10-26 NOTE — Progress Notes (Signed)
Cardiology Office Note  Date:  10/26/2022   ID:  Barbara, Tucker January 27, 1946, MRN [id number]  PCP:  Valerie Roys, DO   Chief Complaint  Patient presents with   12 month follow up     "Doing well." Medications reviewed by the patient verbally.     HPI:  Barbara Tucker is a 76 y.o. female with PMH  Nonischemic cardiomyopathy Ejection fraction 40 to 45% September 2022 Normal coronary arteries, concern for stress cardiomyopathy September 2022 Echo ejection fraction 60 to 65% December 2022 Who presents for follow-up of her nonischemic cardiomyopathy  Records reviewed from December 2022  In follow-up today reports that she feels relatively well Helps take care of her elderly  husband who has medical issues, prior falls He is having balance issues following remote concussion  BP at home: better on HCTZ No regular exercise program Denies leg swelling, no PND orthopnea, no significant weight changes Denies significant shortness of breath or chest discomfort on exertion  EKG personally reviewed by myself on todays visit Sinus bradycardia rate 58 bpm nonspecific T wave ABN  Past cardiac history reviewed Stressful situation in 07/2021, had to give a presentation, in the heat Acute SOB [facility] ED, HS T[address] 323  2014.0.   EKG NSR, 61 bpm, poor R wave progression in III, V1-V3, nonspecific changes.   TSH elevated at 5.032.  LDL 72.   CT without evidence of PE.   Carotids without hemodynamically significant stenosis.   9/22:  Echo EF 45-50%.  LHC showed nl cors, mildly to moderately reduced LVSF with EF 40% and WMA thought most consistent with Takotsubo CM.   Echo 10/21/21  1. Left ventricular ejection fraction, by estimation, is 60 to 65%. The  left ventricle has normal function. The left ventricle has no regional  wall motion abnormalities.   2. The mitral valve is normal in structure. No evidence of mitral valve  regurgitation.    PMH:   has a past medical  history of Allergic rhinitis, Allergy, Endometriosis, Heart murmur (1975), Hypertension, Hypothyroidism, IBS (irritable bowel syndrome), Migraine, Osteoarthritis, and Rheumatoid arthritis ([place]).  PSH:    Past Surgical History:  Procedure Laterality Date   APPENDECTOMY     BREAST EXCISIONAL BIOPSY Right 1975   excisional - negative   BREAST SURGERY  1975?   benign cyst removed   COLONOSCOPY WITH PROPOFOL N/A 02/03/2016   Procedure: COLONOSCOPY WITH PROPOFOL;  Surgeon: Lollie Sails, MD;  Location: [facility];  Service: Endoscopy;  Laterality: N/A;   LEFT HEART CATH AND CORONARY ANGIOGRAPHY N/A 07/10/2021   Procedure: LEFT HEART CATH AND CORONARY ANGIOGRAPHY;  Surgeon: Wellington Hampshire, MD;  Location: [facility];  Service: Cardiovascular;  Laterality: N/A;   OOPHORECTOMY Bilateral    TONSILLECTOMY     TOTAL ABDOMINAL HYSTERECTOMY W/ BILATERAL SALPINGOOPHORECTOMY     TUBAL LIGATION      Current Outpatient Medications  Medication Sig Dispense Refill   ascorbic acid (VITAMIN C) 500 MG tablet Take 500 mg by mouth daily.     calcium carbonate (OSCAL) 1500 (600 Ca) MG TABS tablet Take 600 mg of elemental calcium by mouth 2 (two) times daily with a meal.     Calcium Polycarbophil (FIBER-CAPS PO) Take by mouth.     dicyclomine (BENTYL) 10 MG capsule Take 1 capsule (10 mg total) by mouth 4 (four) times daily as needed for spasms.     estradiol (ESTRACE) 1 MG tablet Take 1 tablet (1 mg total)  by mouth daily. 90 tablet 1   etodolac (LODINE) 400 MG tablet Take 1 tablet (400 mg total) by mouth 2 (two) times daily as needed.  3   fexofenadine (ALLEGRA) 180 MG tablet Take 180 mg by mouth daily.     folic acid (FOLVITE) 1 MG tablet Take 1 mg by mouth daily.     HUMIRA PEN 40 MG/0.4ML PNKT Inject 40 mg into the muscle every 14 (fourteen) days.     hydrochlorothiazide (HYDRODIURIL) 25 MG tablet Take 1 tablet (25 mg total) by mouth daily. 90 tablet 1   hydroxychloroquine (PLAQUENIL) 200  MG tablet TAKE 1 TABLET BY MOUTH TWICE DAILY ON TUESDAY, THURSDAY, SATURDAY, SUNDAY AND 1 TABLET ONCE DAILY ON MONDAY,WEDNESDAY,AND FRIDAY. TAKE WITH FOOD.     losartan (COZAAR) 50 MG tablet Take 1 tablet (50 mg total) by mouth daily. 90 tablet 1   methotrexate 2.5 MG tablet Take 20 mg by mouth once a week. Caution:Chemotherapy. Protect from light.     metoprolol succinate (TOPROL-XL) 25 MG 24 hr tablet Take 1 tablet (25 mg total) by mouth daily. 90 tablet 1   Multiple Vitamin (MULTIVITAMIN) tablet Take 1 tablet by mouth daily.     Omega-3 Fatty Acids (OMEGA-3 EPA FISH OIL PO) Take 340-1,000 mg by mouth daily.     omeprazole (PRILOSEC) 20 MG capsule Take 20 mg by mouth daily.     No current facility-administered medications for this visit.    Allergies:   Codeine, Lisinopril, and Sulfa antibiotics   Social History:  The patient  reports that she has never smoked. She has never used smokeless tobacco. She reports that she does not currently use alcohol. She reports that she does not use drugs.   Family History:   family history includes Angina in her mother; Arthritis in her mother; Breast cancer in her paternal grandmother; Cancer in her brother, father, paternal grandmother, sister, and sister; Gout in her father; Heart disease in her paternal grandmother and sister; Hypertension in her mother; Stroke in her mother; Vision loss in her mother.    Review of Systems: Review of Systems  Constitutional: Negative.   HENT: Negative.    Respiratory: Negative.    Cardiovascular: Negative.   Gastrointestinal: Negative.   Musculoskeletal: Negative.   Neurological: Negative.   Psychiatric/Behavioral: Negative.    All other systems reviewed and are negative.  PHYSICAL EXAM: VS:  BP 136/72 (BP Location: Left Arm, Patient Position: Sitting, Cuff Size: Normal)   Pulse (!) 58   Ht 5\' 7"  (1.702 m)   Wt 152 lb 6 oz (69.1 kg)   SpO2 97%   BMI 23.87 kg/m  , BMI Body mass index is 23.87  kg/m. Constitutional:  oriented to person, place, and time. No distress.  HENT:  Head: Grossly normal Eyes:  no discharge. No scleral icterus.  Neck: No JVD, no carotid bruits  Cardiovascular: Regular rate and rhythm, no murmurs appreciated Pulmonary/Chest: Clear to auscultation bilaterally, no wheezes or rails Abdominal: Soft.  no distension.  no tenderness.  Musculoskeletal: Normal range of motion Neurological:  normal muscle tone. Coordination normal. No atrophy Skin: Skin warm and dry Psychiatric: normal affect, pleasant  Recent Labs: 08/12/2022: ALT 9; Hemoglobin 11.3; Platelets 370; TSH 0.177 09/09/2022: BUN 9; Creatinine, Ser 0.75; Potassium 4.2; Sodium 134    Lipid Panel Lab Results  Component Value Date   CHOL 82 (L) 08/12/2022   HDL 34 (L) 08/12/2022   LDLCALC 28 08/12/2022   TRIG 103 08/12/2022  Wt Readings from Last 3 Encounters:  10/26/22 152 lb 6 oz (69.1 kg)  09/09/22 150 lb 4.8 oz (68.2 kg)  08/12/22 149 lb 8 oz (67.8 kg)     ASSESSMENT AND PLAN:  Problem List Items Addressed This Visit       Cardiology Problems   Takotsubo cardiomyopathy   Relevant Orders   EKG 12-Lead   Other Visit Diagnoses     NICM (nonischemic cardiomyopathy) (HCC)    -  Primary   Relevant Orders   EKG 12-Lead   Chronic HFrEF (heart failure with reduced ejection fraction) (HCC)       Relevant Orders   EKG 12-Lead   Essential hypertension       Relevant Orders   EKG 12-Lead     Cardiomyopathy Ejection fraction normalized December 2000 2260 to 65% Continue losartan, metoprolol Appears euvolemic, denies new symptoms such as shortness of breath, PND orthopnea concerning for recurrence of her symptoms  HTN Blood pressure is well controlled on today's visit. No changes made to the medications.  Recently started on HCTZ, recommend she closely monitor her sodium and potassium level.  Recent sodium 130  Preventive care Recommended regular walking program  Chronic  systolic and diastolic CHF Ejection fraction has normalized end of 2022, appears euvolemic No further testing needed   Total encounter time more than 30 minutes  Greater than 50% was spent in counseling and coordination of care with the patient    Signed, Dossie Arbour, M.D., Ph.D. [facility] [place], [place] [phone]

## 2022-11-28 ENCOUNTER — Encounter: Payer: Self-pay | Admitting: Family Medicine

## 2023-01-18 DIAGNOSIS — M059 Rheumatoid arthritis with rheumatoid factor, unspecified: Secondary | ICD-10-CM | POA: Diagnosis not present

## 2023-01-18 DIAGNOSIS — Z79899 Other long term (current) drug therapy: Secondary | ICD-10-CM | POA: Diagnosis not present

## 2023-01-28 ENCOUNTER — Emergency Department
Admission: EM | Admit: 2023-01-28 | Discharge: 2023-01-28 | Disposition: A | Discharge status: Home / Self Care | Payer: Medicare Other | Attending: Emergency Medicine | Admitting: Emergency Medicine

## 2023-01-28 DIAGNOSIS — R55 Syncope and collapse: Secondary | ICD-10-CM | POA: Diagnosis not present

## 2023-01-28 DIAGNOSIS — E876 Hypokalemia: Secondary | ICD-10-CM | POA: Diagnosis not present

## 2023-01-28 LAB — BASIC METABOLIC PANEL
Anion gap: 12 (ref 5–15)
BUN: 11 mg/dL (ref 8–23)
CO2: 25 mmol/L (ref 22–32)
Calcium: 9.3 mg/dL (ref 8.9–10.3)
Chloride: 95 mmol/L — ABNORMAL LOW (ref 98–111)
Creatinine, Ser: 0.7 mg/dL (ref 0.44–1.00)
GFR, Estimated: >60 mL/min (ref >60)
Glucose, Bld: 142 mg/dL — ABNORMAL HIGH (ref 70–99)
Potassium: 3.2 mmol/L — ABNORMAL LOW (ref 3.5–5.1)
Sodium: 132 mmol/L — ABNORMAL LOW (ref 135–145)

## 2023-01-28 LAB — CBC
HCT: 36.9 % (ref 36.0–46.0)
Hemoglobin: 12.6 g/dL (ref 12.0–15.0)
MCH: 31.8 pg (ref 26.0–34.0)
MCHC: 34.1 g/dL (ref 30.0–36.0)
MCV: 93.2 fL (ref 80.0–100.0)
Platelets: 276 10*3/uL (ref 150–400)
RBC: 3.96 MIL/uL (ref 3.87–5.11)
RDW: 12.4 % (ref 11.5–15.5)
WBC: 6.2 10*3/uL (ref 4.0–10.5)
nRBC: 0 % (ref 0.0–0.2)

## 2023-01-28 LAB — CBG MONITORING, ED: Glucose-Capillary: 132 mg/dL — ABNORMAL HIGH (ref 70–99)

## 2023-01-28 LAB — TROPONIN I (HIGH SENSITIVITY): Troponin I (High Sensitivity): 3 ng/L (ref <18)

## 2023-01-28 MED ORDER — POTASSIUM CHLORIDE CRYS ER 20 MEQ PO TBCR
40.0000 meq | EXTENDED_RELEASE_TABLET | Freq: Once | ORAL | Status: AC
  Administered 2023-01-28: 40 meq via ORAL
  Filled 2023-01-28: qty 2

## 2023-01-28 NOTE — ED Provider Notes (Signed)
MSE was initiated and I personally evaluated the patient and placed orders (if any) at  7:57 PM on January 28, 2023.  The patient appears stable so that the remainder of the MSE may be completed by another provider.  Patient walked out of room 48 where she is visiting her husband who is being admitted to the hospital, and after walking about 15 feet down the hallway she got lightheaded and passed out.  She was next to a door and slid down the door to the floor.  She regained consciousness after a few seconds, denied any preceding complaints before passing out or any current pain.  She remained lightheaded and unable to tolerate sitting up.  She was lifted up onto a stretcher, initial vitals show blood pressure 87/45, heart rate 43.  Fingerstick glucose 132.  Requested stat EKG.  Patient taken to main side ED for evaluation.   Carrie Mew, MD 01/28/23 [phone]

## 2023-01-28 NOTE — Discharge Instructions (Signed)
You are seen in the emergency department having an episode of passing out while you are with your husband.  Concerned that you had a vagal episode.  Your blood pressure improved in the emergency department.  Your potassium was mildly low.  Stay hydrated and drink plenty of fluids.  Follow-up with your primary care physician as needed.  Your potassium was mildly low when checked today.  Make sure to follow up with a primary doctor to follow up your labs.  Make sure to eat food high in potassium and magnesium - examples - potatoes, spinach, bananas, beans, avocadoes, oranges, nuts.

## 2023-01-28 NOTE — ED Provider Notes (Signed)
[facility] Provider Note    Event Date/Time   First MD Initiated Contact with Patient 01/28/23 2010     (approximate)   History   No chief complaint on file.   HPI  Barbara Tucker is a 77 y.o. female presents to the emergency department after an episode of syncope.  Patient has been waiting in the emergency department with her husband who is being evaluated for falls.  States that she has been in the emergency department all day and only drank a small amount, poor p.o. intake.  States that she started to eat some food and all of a sudden felt nauseous and like she needed to have a bowel movement.  Went to walk down the hall to go to the bathroom and then had an episode of passing out.  Near syncope.  Glucose was 132.  Blood pressure was 87/45, heart rate was 43.  Upon my evaluation patient had a blood pressure of 120/60, states that she is feeling much better.  No longer with any nausea or vomiting.  Denies any chest pain.  Denies abdominal pain.  No dysuria, urinary urgency or frequency.     Physical Exam   Triage Vital Signs: ED Triage Vitals  Enc Vitals Group     BP      Pulse      Resp      Temp      Temp src      SpO2      Weight      Height      Head Circumference      Peak Flow      Pain Score      Pain Loc      Pain Edu?      Excl. in [place]?     Most recent vital signs: Vitals:   01/28/23 2100 01/28/23 2130  BP: (!) 151/57 (!) 153/65  Pulse: (!) 54 (!) 58  Resp: 10 16  Temp:    SpO2: 100% 99%    Physical Exam Constitutional:      Appearance: She is well-developed.  HENT:     Head: Atraumatic.  Eyes:     Conjunctiva/sclera: Conjunctivae normal.  Cardiovascular:     Rate and Rhythm: Regular rhythm.  Pulmonary:     Effort: No respiratory distress.  Abdominal:     General: There is no distension.  Musculoskeletal:        General: Normal range of motion.     Cervical back: Normal range of motion.  Skin:    General:  Skin is warm.  Neurological:     Mental Status: She is alert. Mental status is at baseline.     IMPRESSION / MDM / ASSESSMENT AND PLAN / ED COURSE  I reviewed the triage vital signs and the nursing notes.  On chart review patient had a low blood pressure after her syncopal episode.  Differential diagnosis including vasovagal episode, dehydration, electrolyte abnormalities, infectious process  EKG  I, Nathaniel Man, the attending physician, personally viewed and interpreted this ECG.   Rate: 53  Rhythm: Normal sinus  Axis: Normal  Intervals: Normal  ST&T Change: None  No tachycardic or bradycardic dysrhythmias while on cardiac telemetry.  LABS (all labs ordered are listed, but only abnormal results are displayed) Labs interpreted as -    Labs Reviewed  BASIC METABOLIC PANEL - Abnormal; Notable for the following components:      Result Value   Sodium 132 (*)  Potassium 3.2 (*)    Chloride 95 (*)    Glucose, Bld 142 (*)    All other components within normal limits  CBG MONITORING, ED - Abnormal; Notable for the following components:   Glucose-Capillary 132 (*)    All other components within normal limits  CBC  TROPONIN I (HIGH SENSITIVITY)  TROPONIN I (HIGH SENSITIVITY)    TREATMENT    MDM    Orthostatic blood pressures were positive.  Most likely had a vagal episode with her nausea and feeling like she needed to have a bowel movement.  Blood pressure resolved on its own.  Given a liter of IV fluids given concern for dehydration given her poor p.o. intake throughout the day.  Denies any dysuria, urinary urgency or frequency.  Low suspicion for ACS, no chest pain at this time and troponin is negative.  On reevaluation patient feeling much better.  Blood pressures remained stable in the emergency department.  Most likely had a vagal episode.  Discussed close follow-up with a primary care physician and given return precautions.   PROCEDURES:  Critical Care  performed: No  Procedures  Patient's presentation is most consistent with acute presentation with potential threat to life or bodily function.   MEDICATIONS ORDERED IN ED: Medications  potassium chloride SA (KLOR-CON M) CR tablet 40 mEq (40 mEq Oral Given 01/28/23 2141)    FINAL CLINICAL IMPRESSION(S) / ED DIAGNOSES   Final diagnoses:  Syncope, unspecified syncope type  Hypokalemia     Rx / DC Orders   ED Discharge Orders     None        Note:  This document was prepared using Dragon voice recognition software and may include unintentional dictation errors.   Nathaniel Man, MD 01/28/23 [phone]

## 2023-01-30 ENCOUNTER — Encounter: Payer: Self-pay | Admitting: Family Medicine

## 2023-01-31 ENCOUNTER — Encounter: Payer: Self-pay | Admitting: Family Medicine

## 2023-01-31 ENCOUNTER — Ambulatory Visit (INDEPENDENT_AMBULATORY_CARE_PROVIDER_SITE_OTHER): Payer: Medicare Other | Admitting: Family Medicine

## 2023-01-31 ENCOUNTER — Ambulatory Visit: Payer: Medicare Other | Admitting: Family Medicine

## 2023-01-31 VITALS — BP 132/78 | HR 71 | Temp 98.6°F | Ht 67.0 in | Wt 151.3 lb

## 2023-01-31 DIAGNOSIS — R55 Syncope and collapse: Secondary | ICD-10-CM | POA: Diagnosis not present

## 2023-01-31 NOTE — Telephone Encounter (Signed)
Scheduled appointment with provider for today 01/31/2023 @ 4:00 pm.

## 2023-01-31 NOTE — Progress Notes (Signed)
BP 132/78   Pulse 71   Temp 98.6 F (37 C) (Oral)   Ht 5\' 7"  (1.702 m)   Wt 151 lb 4.8 oz (68.6 kg)   SpO2 98%   BMI 23.70 kg/m    Subjective:    Patient ID: Barbara Tucker, female    DOB: 02-04-1946, 77 y.o.   MRN: [id number]  HPI: Barbara Tucker is a 77 y.o. female  Chief Complaint  Patient presents with   Loss of Consciousness    Patient says had an episode of passing out on Friday. Patient says she had not eaten and drank enough fluids. Patient declines having any episodes since then. Patient says she has been staying hydrated.    ER FOLLOW UP Time since discharge: 3 days Hospital/facility: [facility] Diagnosis: Syncope Procedures/tests: No imaging. EKG normal. CBC, Trop and BMP checked Consultants: None New medications: None Discharge instructions: Follow up here   Status: better  Barbara Tucker has been doing better since getting out of the ER. She went with her husband due to falls on Friday and got up to go to the bathroom while she was there with him. She got dizzy and passed out on the way to the bathroom. She was found to be orthostatic and dehydrated. She was given a liter of fluid and some potassium and was observed. She has been making sure to eat and watch her fluid intake since getting home and feels like she is improving. No other concerns or complaints at this time about herself.    Relevant past medical, surgical, family and social history reviewed and updated as indicated. Interim medical history since our last visit reviewed. Allergies and medications reviewed and updated.  Review of Systems  Constitutional: Negative.   Respiratory: Negative.    Cardiovascular: Negative.   Musculoskeletal: Negative.   Neurological:  Positive for syncope and weakness. Negative for dizziness, tremors, seizures, facial asymmetry, speech difficulty, light-headedness, numbness and headaches.  Psychiatric/Behavioral: Negative.      Per HPI unless specifically indicated  above     Objective:    BP 132/78   Pulse 71   Temp 98.6 F (37 C) (Oral)   Ht 5\' 7"  (1.702 m)   Wt 151 lb 4.8 oz (68.6 kg)   SpO2 98%   BMI 23.70 kg/m   Wt Readings from Last 3 Encounters:  01/31/23 151 lb 4.8 oz (68.6 kg)  10/26/22 152 lb 6 oz (69.1 kg)  09/09/22 150 lb 4.8 oz (68.2 kg)    Physical Exam Vitals and nursing note reviewed.  Constitutional:      General: She is not in acute distress.    Appearance: Normal appearance. She is normal weight. She is not ill-appearing, toxic-appearing or diaphoretic.  HENT:     Head: Normocephalic and atraumatic.     Right Ear: External ear normal.     Left Ear: External ear normal.     Nose: Nose normal.     Mouth/Throat:     Mouth: Mucous membranes are moist.     Pharynx: Oropharynx is clear.  Eyes:     General: No scleral icterus.       Right eye: No discharge.        Left eye: No discharge.     Extraocular Movements: Extraocular movements intact.     Conjunctiva/sclera: Conjunctivae normal.     Pupils: Pupils are equal, round, and reactive to light.  Cardiovascular:     Rate and Rhythm: Normal rate and regular rhythm.  Pulses: Normal pulses.     Heart sounds: Normal heart sounds. No murmur heard.    No friction rub. No gallop.  Pulmonary:     Effort: Pulmonary effort is normal. No respiratory distress.     Breath sounds: Normal breath sounds. No stridor. No wheezing, rhonchi or rales.  Chest:     Chest wall: No tenderness.  Musculoskeletal:        General: Normal range of motion.     Cervical back: Normal range of motion and neck supple.  Skin:    General: Skin is warm and dry.     Capillary Refill: Capillary refill takes less than 2 seconds.     Coloration: Skin is not jaundiced or pale.     Findings: No bruising, erythema, lesion or rash.  Neurological:     General: No focal deficit present.     Mental Status: She is alert and oriented to person, place, and time. Mental status is at baseline.   Psychiatric:        Mood and Affect: Mood normal.        Behavior: Behavior normal.        Thought Content: Thought content normal.        Judgment: Judgment normal.     Results for orders placed or performed during the hospital encounter of 01/28/23  CBC  Result Value Ref Range   WBC 6.2 4.0 - 10.5 K/uL   RBC 3.96 3.87 - 5.11 MIL/uL   Hemoglobin 12.6 12.0 - 15.0 g/dL   HCT 36.9 36.0 - 46.0 %   MCV 93.2 80.0 - 100.0 fL   MCH 31.8 26.0 - 34.0 pg   MCHC 34.1 30.0 - 36.0 g/dL   RDW 12.4 11.5 - 15.5 %   Platelets 276 150 - 400 K/uL   nRBC 0.0 0.0 - 0.2 %  Basic metabolic panel  Result Value Ref Range   Sodium 132 (L) 135 - 145 mmol/L   Potassium 3.2 (L) 3.5 - 5.1 mmol/L   Chloride 95 (L) 98 - 111 mmol/L   CO2 25 22 - 32 mmol/L   Glucose, Bld 142 (H) 70 - 99 mg/dL   BUN 11 8 - 23 mg/dL   Creatinine, Ser 0.70 0.44 - 1.00 mg/dL   Calcium 9.3 8.9 - 10.3 mg/dL   GFR, Estimated >60 >60 mL/min   Anion gap 12 5 - 15  CBG monitoring, ED  Result Value Ref Range   Glucose-Capillary 132 (H) 70 - 99 mg/dL  Troponin I (High Sensitivity)  Result Value Ref Range   Troponin I (High Sensitivity) 3 <18 ng/L      Assessment & Plan:   Problem List Items Addressed This Visit   None Visit Diagnoses     Vasovagal syncope    -  Primary   Will check labs. Work on hydration and eating regularly. Rest. Call with any concerns or it it feels like it's happening again.   Relevant Orders   CBC with Differential/Platelet   Basic metabolic panel        Follow up plan: Return as scheduled.

## 2023-02-01 ENCOUNTER — Telehealth: Payer: Medicare Other | Admitting: Family Medicine

## 2023-02-01 LAB — BASIC METABOLIC PANEL
BUN/Creatinine Ratio: 15 (ref 12–28)
BUN: 13 mg/dL (ref 8–27)
CO2: 24 mmol/L (ref 20–29)
Calcium: 10.3 mg/dL (ref 8.7–10.3)
Chloride: 95 mmol/L — ABNORMAL LOW (ref 96–106)
Creatinine, Ser: 0.84 mg/dL (ref 0.57–1.00)
Glucose: 124 mg/dL — ABNORMAL HIGH (ref 70–99)
Potassium: 4 mmol/L (ref 3.5–5.2)
Sodium: 134 mmol/L (ref 134–144)
eGFR: 72 mL/min/{1.73_m2} (ref >59)

## 2023-02-01 LAB — CBC WITH DIFFERENTIAL/PLATELET
Basophils Absolute: 0 10*3/uL (ref 0.0–0.2)
Basos: 0 %
EOS (ABSOLUTE): 0.1 10*3/uL (ref 0.0–0.4)
Eos: 1 %
Hematocrit: 39.5 % (ref 34.0–46.6)
Hemoglobin: 13.5 g/dL (ref 11.1–15.9)
Immature Grans (Abs): 0 10*3/uL (ref 0.0–0.1)
Immature Granulocytes: 0 %
Lymphocytes Absolute: 2.3 10*3/uL (ref 0.7–3.1)
Lymphs: 34 %
MCH: 31.6 pg (ref 26.6–33.0)
MCHC: 34.2 g/dL (ref 31.5–35.7)
MCV: 93 fL (ref 79–97)
Monocytes Absolute: 0.6 10*3/uL (ref 0.1–0.9)
Monocytes: 8 %
Neutrophils Absolute: 3.9 10*3/uL (ref 1.4–7.0)
Neutrophils: 57 %
Platelets: 289 10*3/uL (ref 150–450)
RBC: 4.27 x10E6/uL (ref 3.77–5.28)
RDW: 12.1 % (ref 11.7–15.4)
WBC: 6.9 10*3/uL (ref 3.4–10.8)

## 2023-02-04 ENCOUNTER — Encounter: Payer: Self-pay | Admitting: Family Medicine

## 2023-02-08 ENCOUNTER — Ambulatory Visit: Payer: Medicare Other | Admitting: Family Medicine

## 2023-02-08 ENCOUNTER — Telehealth: Payer: Self-pay | Admitting: Family Medicine

## 2023-02-08 NOTE — Telephone Encounter (Signed)
Copied from [place] [phone]. Topic: General - Other >> Feb 08, 2023  1:11 PM [name] wrote: Reason for CRM: Pt wanted to make Dr. Wynetta Emery aware that she will not be able to make her appt because they are about to take Barbara Tucker off the ventilator.

## 2023-03-01 ENCOUNTER — Ambulatory Visit (INDEPENDENT_AMBULATORY_CARE_PROVIDER_SITE_OTHER): Payer: Medicare Other | Admitting: Family Medicine

## 2023-03-01 ENCOUNTER — Encounter: Payer: Self-pay | Admitting: Family Medicine

## 2023-03-01 VITALS — BP 123/81 | HR 60 | Temp 98.7°F | Ht 67.0 in | Wt 151.0 lb

## 2023-03-01 DIAGNOSIS — F4321 Adjustment disorder with depressed mood: Secondary | ICD-10-CM | POA: Diagnosis not present

## 2023-03-01 NOTE — Progress Notes (Signed)
BP 123/81   Pulse 60   Temp 98.7 F (37.1 C) (Oral)   Ht  (1.702 m)   Wt 151 lb (68.5 kg)   SpO2 97%   BMI 23.65 kg/m    Subjective:    Patient ID: Barbara Tucker, female    DOB: November 15, 1945, 77 y.o.   MRN: [id number]  HPI: Barbara Tucker is a 77 y.o. female  Chief Complaint  Patient presents with   Stress   STRESS- lost her husband about 3 weeks ago.  Duration: 3 weeks Status:stable Anxious mood: no  Excessive worrying: no Irritability: no  Sweating: no Nausea: no Palpitations:no Hyperventilation: no Panic attacks: no Agoraphobia: no  Obscessions/compulsions: no Depressed mood: no    03/01/2023    2:22 PM 01/31/2023    3:25 PM 08/31/2022   12:42 PM 08/12/2022    1:36 PM 02/09/2022   10:07 AM  Depression screen PHQ 2/9  Decreased Interest 0 0 0 0 0  Down, Depressed, Hopeless 0 1 0 0 0  PHQ - 2 Score 0 1 0 0 0  Altered sleeping 0 1 0 1 0  Tired, decreased energy 1 0 0 0 1  Change in appetite 0 0 0 0 0  Feeling bad or failure about yourself  0 0 0 0 0  Trouble concentrating 1 0 0 0 1  Moving slowly or fidgety/restless 0 0 0 0 0  Suicidal thoughts 0 0 0 0 0  PHQ-9 Score 2 2 0 1 2  Difficult doing work/chores Not difficult at all Not difficult at all Not difficult at all Not difficult at all    Anhedonia: no Weight changes: no Insomnia: no   Hypersomnia: no Fatigue/loss of energy: yes Feelings of worthlessness: no Feelings of guilt: no Impaired concentration/indecisiveness: no Suicidal ideations: no  Crying spells: no Recent Stressors/Life Changes: yes   Relationship problems: yes   Family stress: yes     Financial stress: no    Job stress: no    Recent death/loss: yes  Relevant past medical, surgical, family and social history reviewed and updated as indicated. Interim medical history since our last visit reviewed. Allergies and medications reviewed and updated.  Review of Systems  Constitutional: Negative.   Respiratory:  Negative.    Cardiovascular: Negative.   Gastrointestinal: Negative.   Musculoskeletal: Negative.   Neurological: Negative.   Psychiatric/Behavioral: Negative.      Per HPI unless specifically indicated above     Objective:    BP 123/81   Pulse 60   Temp 98.7 F (37.1 C) (Oral)   Ht  (1.702 m)   Wt 151 lb (68.5 kg)   SpO2 97%   BMI 23.65 kg/m   Wt Readings from Last 3 Encounters:  03/01/23 151 lb (68.5 kg)  01/31/23 151 lb 4.8 oz (68.6 kg)  10/26/22 152 lb 6 oz (69.1 kg)    Physical Exam Vitals and nursing note reviewed.  Constitutional:      General: She is not in acute distress.    Appearance: Normal appearance. She is not ill-appearing, toxic-appearing or diaphoretic.  HENT:     Head: Normocephalic and atraumatic.     Right Ear: External ear normal.     Left Ear: External ear normal.     Nose: Nose normal.     Mouth/Throat:     Mouth: Mucous membranes are moist.     Pharynx: Oropharynx is clear.  Eyes:     General: No scleral  icterus.       Right eye: No discharge.        Left eye: No discharge.     Extraocular Movements: Extraocular movements intact.     Conjunctiva/sclera: Conjunctivae normal.     Pupils: Pupils are equal, round, and reactive to light.  Cardiovascular:     Rate and Rhythm: Normal rate and regular rhythm.     Pulses: Normal pulses.     Heart sounds: Normal heart sounds. No murmur heard.    No friction rub. No gallop.  Pulmonary:     Effort: Pulmonary effort is normal. No respiratory distress.     Breath sounds: Normal breath sounds. No stridor. No wheezing, rhonchi or rales.  Chest:     Chest wall: No tenderness.  Musculoskeletal:        General: Normal range of motion.     Cervical back: Normal range of motion and neck supple.  Skin:    General: Skin is warm and dry.     Capillary Refill: Capillary refill takes less than 2 seconds.     Coloration: Skin is not jaundiced or pale.     Findings: No bruising, erythema, lesion or  rash.  Neurological:     General: No focal deficit present.     Mental Status: She is alert and oriented to person, place, and time. Mental status is at baseline.  Psychiatric:        Mood and Affect: Mood normal.        Behavior: Behavior normal.        Thought Content: Thought content normal.        Judgment: Judgment normal.     Results for orders placed or performed in visit on 01/31/23  CBC with Differential/Platelet  Result Value Ref Range   WBC 6.9 3.4 - 10.8 x10E3/uL   RBC 4.27 3.77 - 5.28 x10E6/uL   Hemoglobin 13.5 11.1 - 15.9 g/dL   Hematocrit 16.1 09.6 - 46.6 %   MCV 93 79 - 97 fL   MCH 31.6 26.6 - 33.0 pg   MCHC 34.2 31.5 - 35.7 g/dL   RDW 04.5 40.9 - 81.1 %   Platelets 289 150 - 450 x10E3/uL   Neutrophils 57 Not Estab. %   Lymphs 34 Not Estab. %   Monocytes 8 Not Estab. %   Eos 1 Not Estab. %   Basos 0 Not Estab. %   Neutrophils Absolute 3.9 1.4 - 7.0 x10E3/uL   Lymphocytes Absolute 2.3 0.7 - 3.1 x10E3/uL   Monocytes Absolute 0.6 0.1 - 0.9 x10E3/uL   EOS (ABSOLUTE) 0.1 0.0 - 0.4 x10E3/uL   Basophils Absolute 0.0 0.0 - 0.2 x10E3/uL   Immature Granulocytes 0 Not Estab. %   Immature Grans (Abs) 0.0 0.0 - 0.1 x10E3/uL  Basic metabolic panel  Result Value Ref Range   Glucose 124 (H) 70 - 99 mg/dL   BUN 13 8 - 27 mg/dL   Creatinine, Ser 9.14 0.57 - 1.00 mg/dL   eGFR 72 >78 GN/FAO/1.30   BUN/Creatinine Ratio 15 12 - 28   Sodium 134 134 - 144 mmol/L   Potassium 4.0 3.5 - 5.2 mmol/L   Chloride 95 (L) 96 - 106 mmol/L   CO2 24 20 - 29 mmol/L   Calcium 10.3 8.7 - 10.3 mg/dL      Assessment & Plan:   Problem List Items Addressed This Visit   None Visit Diagnoses     Grief    -  Primary  Doing really well. Does not feel like she needs grief counseling at this time. Continue to monitor. Call with any concerns.        Follow up plan: Return 2-3 months.

## 2023-05-08 NOTE — Progress Notes (Unsigned)
Office Visit    Patient Name: Barbara Tucker Date of Encounter: 05/09/2023  Primary Care Provider:  Dorcas Carrow, DO Primary Cardiologist:  Julien Nordmann, MD  Chief Complaint  77 yo female with past medical history of nonischemic cardiomyopathy, hypertension, hypothyroidism, and rheumatoid arthritis.  Past Medical History    Past Medical History:  Diagnosis Date   Allergic rhinitis    Allergy    Endometriosis    Heart murmur 1975   minimal, seen on ultrasound long ago   Hypertension    Hypothyroidism    IBS (irritable bowel syndrome)    Migraine    Osteoarthritis    Rheumatoid arthritis (HCC)    Past Surgical History:  Procedure Laterality Date   APPENDECTOMY     BREAST EXCISIONAL BIOPSY Right 1975   excisional - negative   BREAST SURGERY  1975?   benign cyst removed   COLONOSCOPY WITH PROPOFOL N/A 02/03/2016   Procedure: COLONOSCOPY WITH PROPOFOL;  Surgeon: Christena Deem, MD;  Location: [facility] ENDOSCOPY;  Service: Endoscopy;  Laterality: N/A;   LEFT HEART CATH AND CORONARY ANGIOGRAPHY N/A 07/10/2021   Procedure: LEFT HEART CATH AND CORONARY ANGIOGRAPHY;  Surgeon: Iran Ouch, MD;  Location: [facility] INVASIVE CV LAB;  Service: Cardiovascular;  Laterality: N/A;   OOPHORECTOMY Bilateral    TONSILLECTOMY     TOTAL ABDOMINAL HYSTERECTOMY W/ BILATERAL SALPINGOOPHORECTOMY     TUBAL LIGATION     Allergies Allergies  Allergen Reactions   Codeine    Lisinopril Other (See Comments)    Migraine   Sulfa Antibiotics    Labs/Other Studies Reviewed    The following studies were reviewed today: Cardiac Studies & Procedures   CARDIAC CATHETERIZATION  CARDIAC CATHETERIZATION 07/10/2021  Narrative   There is mild to moderate left ventricular systolic dysfunction.   LV end diastolic pressure is mildly elevated.  1.  Normal coronary arteries. 2.  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  Recommendations: Recommend medical therapy with a beta-blocker and an ACE inhibitor or ARB.  Anticipate improvement in ejection fraction in the near future.  Findings Coronary Findings Diagnostic  Dominance: Right  Left Main Vessel is angiographically normal.  Left Circumflex Vessel is angiographically normal.  Right Coronary Artery Vessel is angiographically normal.  Right Posterior Descending Artery Vessel is angiographically normal.  Right Posterior Atrioventricular Artery Vessel is angiographically normal.  Intervention  No interventions have been documented.     ECHOCARDIOGRAM  ECHOCARDIOGRAM LIMITED 10/21/2021  Narrative ECHOCARDIOGRAM LIMITED REPORT    Patient Name:   Barbara Tucker Date of Exam: 10/21/2021 Medical Rec #:  [id number]             Height:       67.5 in Accession #:    [id number]            Weight:       145.0 lb Date of Birth:  1945-12-22             BSA:          1.774 m Patient Age:    75 years              BP:           115/75 mmHg Patient Gender: F                     HR:  54 bpm. Exam Location:  [facility]  Procedure: Limited Echo, Limited Color Doppler and Intracardiac Opacification Agent  Indications:    I51.81 Takotsubo syndrome; I42.9 Cardiomyopathy (unspecified)  History:        Patient has prior history of Echocardiogram examinations, most recent 07/10/2021. Cardiomyopathy, CAD and Previous Myocardial Infarction, Signs/Symptoms:Murmur and h/o syncope; Risk Factors:Hypertension and Non-Smoker. H/o MVP diagnosis in the remote past.  Sonographer:    Quentin Ore RDMS, RVT, RDCS Referring Phys: [id number] Zack Seal VISSER  IMPRESSIONS   1. Left ventricular ejection fraction, by estimation, is 60 to 65%. The left ventricle has normal function. The left ventricle has no regional wall motion abnormalities. 2. The mitral valve is normal in structure. No evidence of mitral valve  regurgitation.  FINDINGS Left Ventricle: Left ventricular ejection fraction, by estimation, is 60 to 65%. The left ventricle has normal function. The left ventricle has no regional wall motion abnormalities. Definity contrast agent was given IV to delineate the left ventricular endocardial borders. Global longitudinal strain performed but not reported based on interpreter judgement due to suboptimal tracking. There is no left ventricular hypertrophy.  Mitral Valve: The mitral valve is normal in structure.  LEFT VENTRICLE PLAX 2D LVIDd:         4.00 cm     Diastology LVIDs:         2.20 cm     LV e' medial:    7.94 cm/s LV PW:         1.00 cm     LV E/e' medial:  7.4 LV IVS:        1.10 cm     LV e' lateral:   9.03 cm/s LV E/e' lateral: 6.5  LV Volumes (MOD) LV vol d, MOD A2C: 70.6 ml LV vol d, MOD A4C: 79.1 ml LV vol s, MOD A2C: 32.1 ml LV vol s, MOD A4C: 32.7 ml LV SV MOD A2C:     38.5 ml LV SV MOD A4C:     79.1 ml LV SV MOD BP:      42.2 ml  RIGHT VENTRICLE RV S prime:     10.70 cm/s TAPSE (M-mode): 2.1 cm  MITRAL VALVE MV Area (PHT): 3.26 cm MV Decel Time: 233 msec MV E velocity: 59.10 cm/s MV A velocity: 68.10 cm/s MV E/A ratio:  0.87  Debbe Odea MD Electronically signed by Debbe Odea MD Signature Date/Time: 10/21/2021/7:30:58 PM    Final            Recent Labs: 08/12/2022: ALT 9; TSH 0.177 01/31/2023: BUN 13; Creatinine, Ser 0.84; Hemoglobin 13.5; Platelets 289; Potassium 4.0; Sodium 134  Recent Lipid Panel    Component Value Date/Time   CHOL 82 (L) 08/12/2022 1418   TRIG 103 08/12/2022 1418   HDL 34 (L) 08/12/2022 1418   CHOLHDL 2.5 07/09/2021 1217   VLDL 20 07/09/2021 1217   LDLCALC 28 08/12/2022 1418   History of Present Illness  77 yo female with past mecidical history of nonischemic cardiomyopathy, hypertension, hypothyroidism, and rheumatoid arthritis.  On 07/09/21 she presented to [facility] for weakness, dizziness and emesis. Her HS Tn  peaked at 2014. CT negative for PE, carotids without hemodynamically significant stenosis. She underwent cardiac catheterization that showed normal coronary arteries, mildly to moderaltey reduces LVSF with EF 40% and WMA thought most consistent with Takotsubo CM. Her TSH at that time was elevated at 5.032.   At her last OV with Dr. Mariah Milling in December 2023 she was doing well.  Her  Echocardiogram in 2022 indicated an EF of 60 to 65%, LV with normal function, no regional wall motion abnormalities.  On 01/28/2023 she presented to the emergency department after syncopal episode.  She had been waiting in the emergency department with her husband who was being evaluated for a fall.  Reported she had poor p.o. intake.  She had started to eat some food then felt nauseous, and as though she needed to have a bowel movement.  She started to walk down the hallway then had a syncopal event. Blood pressure was 87/45, heart rate was 43. In the ED her orthostatic blood pressures were positive, felt to be a vagal episode. Blood pressure resolved on its own.  She was given a liter of IV fluids. She denied chest pain and troponin was negative.  Today reports she has had a stressful past few months, her husband passed in April and she has been trying to manage things. Notes some intermittent shortness of breath, can occur when climbing stairs or with speaking for an extended period of time. She denies chest pain, orthopnea, pnd or bilateral lower extremity swelling. Notes she does have occasional swelling of her right ankle, related to arthritis.   Home Medications    Current Outpatient Medications  Medication Sig Dispense Refill   ascorbic acid (VITAMIN C) 500 MG tablet Take 500 mg by mouth daily.     calcium carbonate (OSCAL) 1500 (600 Ca) MG TABS tablet Take 600 mg of elemental calcium by mouth 2 (two) times daily with a meal.     Calcium Polycarbophil (FIBER-CAPS PO) Take by mouth.     dicyclomine (BENTYL) 10 MG  capsule Take 1 capsule (10 mg total) by mouth 4 (four) times daily as needed for spasms.     estradiol (ESTRACE) 1 MG tablet Take 1 tablet (1 mg total) by mouth daily. 90 tablet 1   etodolac (LODINE) 400 MG tablet Take 1 tablet (400 mg total) by mouth 2 (two) times daily as needed.  3   fexofenadine (ALLEGRA) 180 MG tablet Take 180 mg by mouth daily.     folic acid (FOLVITE) 1 MG tablet Take 1 mg by mouth daily.     HUMIRA PEN 40 MG/0.4ML PNKT Inject 40 mg into the muscle every 14 (fourteen) days.     hydrochlorothiazide (HYDRODIURIL) 25 MG tablet Take 1 tablet (25 mg total) by mouth daily. 90 tablet 3   hydroxychloroquine (PLAQUENIL) 200 MG tablet TAKE 1 TABLET BY MOUTH TWICE DAILY ON TUESDAY, THURSDAY, SATURDAY, SUNDAY AND 1 TABLET ONCE DAILY ON MONDAY,WEDNESDAY,AND FRIDAY. TAKE WITH FOOD.     losartan (COZAAR) 50 MG tablet Take 1 tablet (50 mg total) by mouth daily. 90 tablet 3   methotrexate 2.5 MG tablet Take 20 mg by mouth once a week. Caution:Chemotherapy. Protect from light.     metoprolol succinate (TOPROL-XL) 25 MG 24 hr tablet Take 1 tablet (25 mg total) by mouth daily. 90 tablet 3   Multiple Vitamin (MULTIVITAMIN) tablet Take 1 tablet by mouth daily.     Omega-3 Fatty Acids (OMEGA-3 EPA FISH OIL PO) Take 340-1,000 mg by mouth daily.     omeprazole (PRILOSEC) 20 MG capsule Take 20 mg by mouth daily.     No current facility-administered medications for this visit.     Review of Systems    She denies chest pain, palpitations, pnd, orthopnea, n, v, dizziness, syncope, edema, weight gain, or early satiety. All other systems reviewed and are otherwise negative except as noted  above.    Physical Exam    VS:  BP 130/60 (BP Location: Left Arm, Patient Position: Sitting, Cuff Size: Normal)   Pulse 65   Ht 5\' 7"  (1.702 m)   Wt 152 lb (68.9 kg)   SpO2 98%   BMI 23.81 kg/m  , BMI Body mass index is 23.81 kg/m.     GEN: Well nourished, well developed, in no acute distress. HEENT:  normal. Neck: Supple, no JVD, carotid bruits, or masses. Cardiac: RRR, no murmurs, rubs, or gallops. No clubbing, cyanosis, edema.  Radials/DP/PT 2+ and equal bilaterally.  Respiratory:  Respirations regular and unlabored, clear to auscultation bilaterally. GI: Soft, nontender, nondistended, BS + x 4. MS: no deformity or atrophy. Skin: warm and dry, no rash. Neuro:  Strength and sensation are intact. Psych: Normal affect.  Accessory Clinical Findings    ECG personally reviewed by me today - EKG Interpretation Date/Time:  Monday May 09 2023 13:58:54 EDT Ventricular Rate:  65 PR Interval:  148 QRS Duration:  96 QT Interval:  408 QTC Calculation: 424 R Axis:   -17  Text Interpretation: Normal sinus rhythm Minimal voltage criteria for LVH, may be normal variant ( R in aVL ) Nonspecific T wave abnormality in lateral leads No acute changes When compared with ECG of 28-Jan-2023 20:07, Confirmed by Ramsey [phone]) on 05/09/2023 2:27:46 PM  Lab Results  Component Value Date   WBC 6.9 01/31/2023   HGB 13.5 01/31/2023   HCT 39.5 01/31/2023   MCV 93 01/31/2023   PLT 289 01/31/2023   Lab Results  Component Value Date   CREATININE 0.84 01/31/2023   BUN 13 01/31/2023   NA 134 01/31/2023   K 4.0 01/31/2023   CL 95 (L) 01/31/2023   CO2 24 01/31/2023   Lab Results  Component Value Date   ALT 9 08/12/2022   AST 14 08/12/2022   ALKPHOS 111 08/12/2022   BILITOT 0.7 08/12/2022   Lab Results  Component Value Date   CHOL 82 (L) 08/12/2022   HDL 34 (L) 08/12/2022   LDLCALC 28 08/12/2022   TRIG 103 08/12/2022   CHOLHDL 2.5 07/09/2021    Lab Results  Component Value Date   HGBA1C 5.6 07/09/2021    Assessment & Plan    Nonischemic cardiomyopathy/CHF: Found to have takostubo cardiomyopathy in 07/2021, at that time she had a LHC showing normal coronary arteries. Echocardiogram in 10/2021 indicated an EF of 60 to 65%, LV with normal function, no regional wall motion abnormalities.  Reports intermittent dyspnea with exertion or with prolonged speaking. Appears euvolemic and well compensated on exam today. Reviewed ED precautions. With new shortness of breath will repeat echocardiogram. Continue hydrochlorothiazide, losartan and Toprol.   Syncope: She had a syncopal episode in 01/2023, per ED notes appeared to be vagal episode. Notes she had not eaten anything that day and had little to drink. Reports no further episodes. Echo noted as above.   HTN: Blood pressure well controlled today at 130/60. Continue hydrochlorothiazide, losartan and Toprol.   Dyslipidemia: Last lipid panel 08/12/22 indicated a total cholesterol of 82 and LDL of 28.   Disposition: Follow up with Eula Listen, PA in two months.     Rip Harbour, NP 05/09/2023, 3:31 PM

## 2023-05-09 ENCOUNTER — Ambulatory Visit: Payer: Medicare Other | Attending: Physician Assistant | Admitting: Cardiology

## 2023-05-09 ENCOUNTER — Encounter: Payer: Self-pay | Admitting: Physician Assistant

## 2023-05-09 VITALS — BP 130/60 | HR 65 | Ht 67.0 in | Wt 152.0 lb

## 2023-05-09 DIAGNOSIS — I5181 Takotsubo syndrome: Secondary | ICD-10-CM | POA: Diagnosis not present

## 2023-05-09 DIAGNOSIS — I10 Essential (primary) hypertension: Secondary | ICD-10-CM

## 2023-05-09 DIAGNOSIS — I428 Other cardiomyopathies: Secondary | ICD-10-CM

## 2023-05-09 DIAGNOSIS — R0602 Shortness of breath: Secondary | ICD-10-CM

## 2023-05-09 DIAGNOSIS — R55 Syncope and collapse: Secondary | ICD-10-CM

## 2023-05-09 NOTE — Patient Instructions (Signed)
Medication Instructions:  Your physician recommends that you continue on your current medications as directed. Please refer to the Current Medication list given to you today.  *If you need a refill on your cardiac medications before your next appointment, please call your pharmacy*  Lab Work: -None ordered  Testing/Procedures: Your physician has requested that you have an echocardiogram. Echocardiography is a painless test that uses sound waves to create images of your heart. It provides your doctor with information about the size and shape of your heart and how well your heart's chambers and valves are working. This procedure takes approximately one hour. There are no restrictions for this procedure. Please do NOT wear cologne, perfume, aftershave, or lotions (deodorant is allowed). Please arrive 15 minutes prior to your appointment time.   Follow-Up: At [facility], you and your health needs are our priority.  As part of our continuing mission to provide you with exceptional heart care, we have created designated Provider Care Teams.  These Care Teams include your primary Cardiologist (physician) and Advanced Practice Providers (APPs -  Physician Assistants and Nurse Practitioners) who all work together to provide you with the care you need, when you need it.  Your next appointment:   2 month(s)  Provider:   You may see Julien Nordmann, MD or one of the following Advanced Practice Providers on your designated Care Team:   Nicolasa Ducking, NP Eula Listen, PA-C Cadence Fransico Michael, PA-C Charlsie Quest, NP    Other Instructions -None

## 2023-05-23 DIAGNOSIS — M059 Rheumatoid arthritis with rheumatoid factor, unspecified: Secondary | ICD-10-CM | POA: Diagnosis not present

## 2023-05-23 DIAGNOSIS — Z79899 Other long term (current) drug therapy: Secondary | ICD-10-CM | POA: Diagnosis not present

## 2023-05-25 ENCOUNTER — Encounter: Payer: Self-pay | Admitting: Family Medicine

## 2023-05-25 ENCOUNTER — Encounter: Payer: Self-pay | Admitting: Cardiovascular Disease

## 2023-05-31 ENCOUNTER — Ambulatory Visit: Payer: Medicare Other | Attending: Cardiology

## 2023-05-31 DIAGNOSIS — R0602 Shortness of breath: Secondary | ICD-10-CM | POA: Diagnosis not present

## 2023-05-31 DIAGNOSIS — R55 Syncope and collapse: Secondary | ICD-10-CM | POA: Diagnosis not present

## 2023-05-31 DIAGNOSIS — I5181 Takotsubo syndrome: Secondary | ICD-10-CM | POA: Diagnosis not present

## 2023-05-31 DIAGNOSIS — I428 Other cardiomyopathies: Secondary | ICD-10-CM

## 2023-05-31 DIAGNOSIS — I10 Essential (primary) hypertension: Secondary | ICD-10-CM

## 2023-05-31 LAB — ECHOCARDIOGRAM COMPLETE
Area-P 1/2: 3.31 cm2
S' Lateral: 2 cm

## 2023-06-07 ENCOUNTER — Encounter: Payer: Self-pay | Admitting: Family Medicine

## 2023-06-07 ENCOUNTER — Ambulatory Visit (INDEPENDENT_AMBULATORY_CARE_PROVIDER_SITE_OTHER): Payer: Medicare Other | Admitting: Family Medicine

## 2023-06-07 VITALS — BP 131/78 | HR 60 | Temp 97.7°F | Wt 152.0 lb

## 2023-06-07 DIAGNOSIS — M059 Rheumatoid arthritis with rheumatoid factor, unspecified: Secondary | ICD-10-CM | POA: Diagnosis not present

## 2023-06-07 DIAGNOSIS — F4321 Adjustment disorder with depressed mood: Secondary | ICD-10-CM

## 2023-06-07 DIAGNOSIS — I10 Essential (primary) hypertension: Secondary | ICD-10-CM | POA: Diagnosis not present

## 2023-06-07 MED ORDER — ESTRADIOL 1 MG PO TABS: 1.0000 mg | ORAL_TABLET | Freq: Every day | ORAL | 1 refills | Status: DC

## 2023-06-07 NOTE — Assessment & Plan Note (Signed)
Continue to follow with rheumatology. Call with any concerns.  

## 2023-06-07 NOTE — Progress Notes (Signed)
BP 131/78   Pulse 60   Temp 97.7 F (36.5 C) (Oral)   Wt 152 lb (68.9 kg)   SpO2 97%   BMI 23.81 kg/m    Subjective:    Patient ID: Barbara Tucker, female    DOB: 21-Oct-1946, 77 y.o.   MRN: [id number]  HPI: Barbara Tucker is a 77 y.o. female  Chief Complaint  Patient presents with   Hypertension   HYPERTENSION  Hypertension status: controlled  Satisfied with current treatment? yes Duration of hypertension: chronic BP monitoring frequency:  a few times a month BP medication side effects:  no Medication compliance: excellent compliance Previous BP meds:hydrochlorothiazide, metoprolol, losartan Aspirin: no Recurrent headaches: no Visual changes: no Palpitations: no Dyspnea: no Chest pain: no Lower extremity edema: no Dizzy/lightheaded: no  Has been following with rheumatology- they are changing her medication as she had a pretty nasty flare a bit ago. She has not started the new medicine yet, but she did find the prednisone helpful.  She is hanging in there with her grief. Her women's group is supporting and distracting her. She has good IT sales professional who are helping with the paperwork from the estate. She is feeling overwhelmed, but sees a light at the end of the tunnel. She is otherwise doing well with no other concerns or complaints at this time.   Relevant past medical, surgical, family and social history reviewed and updated as indicated. Interim medical history since our last visit reviewed. Allergies and medications reviewed and updated.  Review of Systems  Constitutional: Negative.   Respiratory: Negative.    Cardiovascular: Negative.   Musculoskeletal:  Positive for arthralgias and joint swelling. Negative for back pain, gait problem, myalgias, neck pain and neck stiffness.  Skin: Negative.   Neurological: Negative.   Psychiatric/Behavioral: Negative.      Per HPI unless specifically indicated above     Objective:    BP 131/78    Pulse 60   Temp 97.7 F (36.5 C) (Oral)   Wt 152 lb (68.9 kg)   SpO2 97%   BMI 23.81 kg/m   Wt Readings from Last 3 Encounters:  06/07/23 152 lb (68.9 kg)  05/09/23 152 lb (68.9 kg)  03/01/23 151 lb (68.5 kg)    Physical Exam Vitals and nursing note reviewed.  Constitutional:      General: She is not in acute distress.    Appearance: Normal appearance. She is normal weight. She is not ill-appearing, toxic-appearing or diaphoretic.  HENT:     Head: Normocephalic and atraumatic.     Right Ear: External ear normal.     Left Ear: External ear normal.     Nose: Nose normal.     Mouth/Throat:     Mouth: Mucous membranes are moist.     Pharynx: Oropharynx is clear.  Eyes:     General: No scleral icterus.       Right eye: No discharge.        Left eye: No discharge.     Extraocular Movements: Extraocular movements intact.     Conjunctiva/sclera: Conjunctivae normal.     Pupils: Pupils are equal, round, and reactive to light.  Cardiovascular:     Rate and Rhythm: Normal rate and regular rhythm.     Pulses: Normal pulses.     Heart sounds: Normal heart sounds. No murmur heard.    No friction rub. No gallop.  Pulmonary:     Effort: Pulmonary effort is normal. No respiratory distress.  Breath sounds: Normal breath sounds. No stridor. No wheezing, rhonchi or rales.  Chest:     Chest wall: No tenderness.  Musculoskeletal:        General: Normal range of motion.     Cervical back: Normal range of motion and neck supple.  Skin:    General: Skin is warm and dry.     Capillary Refill: Capillary refill takes less than 2 seconds.     Coloration: Skin is not jaundiced or pale.     Findings: No bruising, erythema, lesion or rash.  Neurological:     General: No focal deficit present.     Mental Status: She is alert and oriented to person, place, and time. Mental status is at baseline.  Psychiatric:        Mood and Affect: Mood normal.        Behavior: Behavior normal.         Thought Content: Thought content normal.        Judgment: Judgment normal.     Results for orders placed or performed in visit on 05/31/23  ECHOCARDIOGRAM COMPLETE  Result Value Ref Range   S' Lateral 2.00 cm   Area-P 1/2 3.31 cm2   Est EF 55 - 60%       Assessment & Plan:   Problem List Items Addressed This Visit       Cardiovascular and Mediastinum   Hypertension - Primary    Under good control on current regimen. Continue current regimen. Continue to monitor. Call with any concerns. Refills through cardiology. Labs drawn today.        Relevant Orders   Basic metabolic panel     Musculoskeletal and Integument   Rheumatoid arthritis (HCC)    Continue to follow with rheumatology. Call with any concerns.       Other Visit Diagnoses     Grief       Hanging in there. Will call with any concerns. Continue to monitor.        Follow up plan: Return 3-6 months for physical.

## 2023-06-07 NOTE — Assessment & Plan Note (Signed)
Under good control on current regimen. Continue current regimen. Continue to monitor. Call with any concerns. Refills through cardiology. Labs drawn today. 

## 2023-06-08 ENCOUNTER — Encounter: Payer: Self-pay | Admitting: Family Medicine

## 2023-07-11 NOTE — Progress Notes (Unsigned)
Cardiology Office Note  Date:  07/12/2023   ID:  Barbara, Tucker 07-11-1946, MRN [id number]  PCP:  Dorcas Carrow, DO   Chief Complaint  Patient presents with   2 month follow up     "Doing well." Medications reviewed by the patient verbally.     HPI:  Barbara Tucker is a 77 y.o. female with PMH  Nonischemic cardiomyopathy Ejection fraction 40 to 45% September 2022 Normal coronary arteries, concern for stress cardiomyopathy September 2022 Echo ejection fraction 60 to 65% December 2022 Who presents for follow-up of her nonischemic cardiomyopathy  Records reviewed from December 2023 Seen by one of our providers May 09, 2023   01/28/2023 presented to the emergency department after syncopal episode.  She had been waiting in the emergency department with her husband who was being evaluated for a fall.  Reported she had poor p.o. intake.  She had started to eat some food then felt nauseous, and as though she needed to have a bowel movement.  She started to walk down the hallway then had a syncopal event. Blood pressure was 87/45, heart rate was 43. In the ED her orthostatic blood pressures were positive, felt to be a vagal episode. Blood pressure resolved on its own.   Husband passed away 03-Mar-2023 Echocardiogram May 31, 2023 Ejection fraction 55 to 60%, normal RV size and function  Lives alone, Managing estate, trying to downsize before moving into [facility], she is on the list  Followed by rheumatology, ankle RA flaring  Has not been checking blood pressure at home, was concerned it was elevated Last 3 blood pressure checks on office visits through July blood pressure well-controlled, these numbers discussed   Past cardiac history reviewed Stressful situation in 07/2021, had to give a presentation, in the heat Acute SOB [facility] ED, HS [address]  2014.0.   EKG NSR, 61 bpm, poor R wave progression in III, V1-V3, nonspecific changes.   TSH elevated at 5.032.  LDL  72.   CT without evidence of PE.   Carotids without hemodynamically significant stenosis.   9/22:  Echo EF 45-50%.  LHC showed nl cors, mildly to moderately reduced LVSF with EF 40% and WMA thought most consistent with Takotsubo CM.   Echo 10/21/21  1. Left ventricular ejection fraction, by estimation, is 60 to 65%. The  left ventricle has normal function. The left ventricle has no regional  wall motion abnormalities.   2. The mitral valve is normal in structure. No evidence of mitral valve  regurgitation.    PMH:   has a past medical history of Allergic rhinitis, Allergy, Endometriosis, Heart murmur (1975), Hypertension, Hypothyroidism, IBS (irritable bowel syndrome), Migraine, Osteoarthritis, and Rheumatoid arthritis (HCC).  PSH:    Past Surgical History:  Procedure Laterality Date   APPENDECTOMY     BREAST EXCISIONAL BIOPSY Right 1975   excisional - negative   BREAST SURGERY  1975?   benign cyst removed   COLONOSCOPY WITH PROPOFOL N/A 02/03/2016   Procedure: COLONOSCOPY WITH PROPOFOL;  Surgeon: Christena Deem, MD;  Location: [facility] ENDOSCOPY;  Service: Endoscopy;  Laterality: N/A;   LEFT HEART CATH AND CORONARY ANGIOGRAPHY N/A 07/10/2021   Procedure: LEFT HEART CATH AND CORONARY ANGIOGRAPHY;  Surgeon: Iran Ouch, MD;  Location: [facility] INVASIVE CV LAB;  Service: Cardiovascular;  Laterality: N/A;   OOPHORECTOMY Bilateral    TONSILLECTOMY     TOTAL ABDOMINAL HYSTERECTOMY W/ BILATERAL SALPINGOOPHORECTOMY     TUBAL LIGATION  Current Outpatient Medications  Medication Sig Dispense Refill   ascorbic acid (VITAMIN C) 500 MG tablet Take 500 mg by mouth daily.     calcium carbonate (OSCAL) 1500 (600 Ca) MG TABS tablet Take 600 mg of elemental calcium by mouth 2 (two) times daily with a meal.     Calcium Polycarbophil (FIBER-CAPS PO) Take by mouth.     dicyclomine (BENTYL) 10 MG capsule Take 1 capsule (10 mg total) by mouth 4 (four) times daily as needed for spasms.      estradiol (ESTRACE) 1 MG tablet Take 1 tablet (1 mg total) by mouth daily. 90 tablet 1   etodolac (LODINE) 400 MG tablet Take 1 tablet (400 mg total) by mouth 2 (two) times daily as needed.  3   fexofenadine (ALLEGRA) 180 MG tablet Take 180 mg by mouth daily.     folic acid (FOLVITE) 1 MG tablet Take 1 mg by mouth daily.     hydrochlorothiazide (HYDRODIURIL) 25 MG tablet Take 1 tablet (25 mg total) by mouth daily. 90 tablet 3   hydroxychloroquine (PLAQUENIL) 200 MG tablet TAKE 1 TABLET BY MOUTH TWICE DAILY ON TUESDAY, THURSDAY, SATURDAY, SUNDAY AND 1 TABLET ONCE DAILY ON MONDAY,WEDNESDAY,AND FRIDAY. TAKE WITH FOOD.     losartan (COZAAR) 50 MG tablet Take 1 tablet (50 mg total) by mouth daily. 90 tablet 3   methotrexate 2.5 MG tablet Take 20 mg by mouth once a week. Caution:Chemotherapy. Protect from light.     metoprolol succinate (TOPROL-XL) 25 MG 24 hr tablet Take 1 tablet (25 mg total) by mouth daily. 90 tablet 3   Multiple Vitamin (MULTIVITAMIN) tablet Take 1 tablet by mouth daily.     Omega-3 Fatty Acids (OMEGA-3 EPA FISH OIL PO) Take 340-1,000 mg by mouth daily.     omeprazole (PRILOSEC) 20 MG capsule Take 20 mg by mouth daily.     RINVOQ 15 MG TB24 Take 15 mg by mouth daily.     No current facility-administered medications for this visit.    Allergies:   Codeine, Lisinopril, and Sulfa antibiotics   Social History:  The patient  reports that she has never smoked. She has never used smokeless tobacco. She reports that she does not currently use alcohol. She reports that she does not use drugs.   Family History:   family history includes Angina in her mother; Arthritis in her mother; Breast cancer in her paternal grandmother; Cancer in her brother, father, paternal grandmother, sister, and sister; Gout in her father; Heart disease in her paternal grandmother and sister; Hypertension in her mother; Stroke in her mother; Vision loss in her mother.   Review of Systems: Review of Systems   Constitutional: Negative.   HENT: Negative.    Respiratory: Negative.    Cardiovascular: Negative.   Gastrointestinal: Negative.   Musculoskeletal: Negative.   Neurological: Negative.   Psychiatric/Behavioral: Negative.    All other systems reviewed and are negative.  PHYSICAL EXAM: VS:  BP 138/80 (BP Location: Left Arm, Patient Position: Sitting, Cuff Size: Normal)   Pulse (!) 56   Ht 5\' 8"  (1.727 m)   Wt 153 lb 6 oz (69.6 kg)   SpO2 97%   BMI 23.32 kg/m  , BMI Body mass index is 23.32 kg/m. Constitutional:  oriented to person, place, and time. No distress.  HENT:  Head: Grossly normal Eyes:  no discharge. No scleral icterus.  Neck: No JVD, no carotid bruits  Cardiovascular: Regular rate and rhythm, no murmurs appreciated Pulmonary/Chest: Clear  to auscultation bilaterally, no wheezes or rails Abdominal: Soft.  no distension.  no tenderness.  Musculoskeletal: Normal range of motion Neurological:  normal muscle tone. Coordination normal. No atrophy Skin: Skin warm and dry Psychiatric: normal affect, pleasant  Recent Labs: 08/12/2022: ALT 9; TSH 0.177 01/31/2023: Hemoglobin 13.5; Platelets 289 06/07/2023: BUN 9; Creatinine, Ser 0.73; Potassium 3.8; Sodium 133    Lipid Panel Lab Results  Component Value Date   CHOL 82 (L) 08/12/2022   HDL 34 (L) 08/12/2022   LDLCALC 28 08/12/2022   TRIG 103 08/12/2022    Wt Readings from Last 3 Encounters:  07/12/23 153 lb 6 oz (69.6 kg)  06/07/23 152 lb (68.9 kg)  05/09/23 152 lb (68.9 kg)    ASSESSMENT AND PLAN:  Problem List Items Addressed This Visit       Cardiology Problems   Hypertension   Takotsubo cardiomyopathy     Other   Subclinical hypothyroidism   Other Visit Diagnoses     NICM (nonischemic cardiomyopathy) (HCC)    -  Primary   Syncope, unspecified syncope type       Shortness of breath       Chronic HFrEF (heart failure with reduced ejection fraction) (HCC)       Essential hypertension           Cardiomyopathy Ejection fraction normalized December 22 was 60 to 65% Echocardiogram July 2024 normal EF Continue losartan, metoprolol Appears euvolemic  HTN Blood pressure is well controlled on today's visit. No changes made to the medications.  Preventive care Recommended regular walking program  Chronic systolic and diastolic CHF Ejection fraction has normalized end of 2022, normal July 2024  No further testing needed Euvolemic   Total encounter time more than 30 minutes  Greater than 50% was spent in counseling and coordination of care with the patient    Signed, Dossie Arbour, M.D., Ph.D. [facility] [place], [place] [phone]

## 2023-07-12 ENCOUNTER — Encounter: Payer: Self-pay | Admitting: Cardiovascular Disease

## 2023-07-12 ENCOUNTER — Ambulatory Visit: Payer: Medicare Other | Attending: Cardiovascular Disease | Admitting: Cardiovascular Disease

## 2023-07-12 VITALS — BP 138/80 | HR 56 | Ht 68.0 in | Wt 153.4 lb

## 2023-07-12 DIAGNOSIS — I5022 Chronic systolic (congestive) heart failure: Secondary | ICD-10-CM

## 2023-07-12 DIAGNOSIS — I428 Other cardiomyopathies: Secondary | ICD-10-CM

## 2023-07-12 DIAGNOSIS — R0602 Shortness of breath: Secondary | ICD-10-CM

## 2023-07-12 DIAGNOSIS — I10 Essential (primary) hypertension: Secondary | ICD-10-CM

## 2023-07-12 DIAGNOSIS — R55 Syncope and collapse: Secondary | ICD-10-CM

## 2023-07-12 DIAGNOSIS — I5181 Takotsubo syndrome: Secondary | ICD-10-CM

## 2023-07-12 DIAGNOSIS — E038 Other specified hypothyroidism: Secondary | ICD-10-CM

## 2023-07-12 MED ORDER — METOPROLOL SUCCINATE ER 25 MG PO TB24: 25.0000 mg | ORAL_TABLET | Freq: Every day | ORAL | 3 refills | Status: DC

## 2023-07-12 MED ORDER — HYDROCHLOROTHIAZIDE 25 MG PO TABS: 25.0000 mg | ORAL_TABLET | Freq: Every day | ORAL | 3 refills | Status: DC

## 2023-07-12 MED ORDER — LOSARTAN POTASSIUM 50 MG PO TABS: 50.0000 mg | ORAL_TABLET | Freq: Every day | ORAL | 3 refills | Status: DC

## 2023-07-12 NOTE — Patient Instructions (Signed)
Monitor blood pressure at home   Medication Instructions:  No changes  If you need a refill on your cardiac medications before your next appointment, please call your pharmacy.   Lab work: No new labs needed  Testing/Procedures: No new testing needed  Follow-Up: At [facility], you and your health needs are our priority.  As part of our continuing mission to provide you with exceptional heart care, we have created designated Provider Care Teams.  These Care Teams include your primary Cardiologist (physician) and Advanced Practice Providers (APPs -  Physician Assistants and Nurse Practitioners) who all work together to provide you with the care you need, when you need it.  You will need a follow up appointment in 12 months  Providers on your designated Care Team:   Murray Hodgkins, NP Christell Faith, PA-C Cadence Kathlen Mody, [place]  COVID-19 Vaccine Information can be found at: [email or web address].uk For questions related to vaccine distribution or appointments, please email [email or web address] or call [phone].

## 2023-08-09 ENCOUNTER — Other Ambulatory Visit: Payer: Self-pay | Admitting: Family Medicine

## 2023-08-09 DIAGNOSIS — Z1231 Encounter for screening mammogram for malignant neoplasm of breast: Secondary | ICD-10-CM

## 2023-08-30 ENCOUNTER — Ambulatory Visit
Admission: RE | Admit: 2023-08-30 | Discharge: 2023-08-30 | Disposition: A | Discharge status: Home / Self Care | Payer: Medicare Other | Source: Ambulatory Visit | Attending: Family Medicine | Admitting: Family Medicine

## 2023-08-30 DIAGNOSIS — Z1231 Encounter for screening mammogram for malignant neoplasm of breast: Secondary | ICD-10-CM | POA: Insufficient documentation

## 2023-09-22 ENCOUNTER — Ambulatory Visit: Payer: Medicare Other | Admitting: Emergency Medicine

## 2023-09-22 VITALS — Ht 68.0 in | Wt 150.0 lb

## 2023-09-22 DIAGNOSIS — Z0000 Encounter for general adult medical examination without abnormal findings: Secondary | ICD-10-CM

## 2023-09-22 NOTE — Patient Instructions (Addendum)
Ms. Barbara Tucker , Thank you for taking time to come for your Medicare Wellness Visit. I appreciate your ongoing commitment to your health goals. Please review the following plan we discussed and let me know if I can assist you in the future.   Referrals/Orders/Follow-Ups/Clinician Recommendations:  Get the flu and covid vaccines as discussed. Wishing you the best on your upcoming move! Keep up the good work!  This is a list of the screening recommended for you and due dates:  Health Maintenance  Topic Date Due   COVID-19 Vaccine (6 - 2023-24 season) 07/10/2023   Flu Shot  02/06/2024*   Mammogram  08/29/2024   Medicare Annual Wellness Visit  09/21/2024   DEXA scan (bone density measurement)  10/05/2025   DTaP/Tdap/Td vaccine (2 - Tdap) 07/25/2028   Pneumonia Vaccine  Completed   Hepatitis C Screening  Completed   Zoster (Shingles) Vaccine  Completed   HPV Vaccine  Aged Out   Colon Cancer Screening  Discontinued  *Topic was postponed. The date shown is not the original due date.    Advanced directives: (In Chart) A copy of your advanced directives are scanned into your chart should your provider ever need it.  Next Medicare Annual Wellness Visit scheduled for next year: Yes, 09/25/24 @ 9:20am

## 2023-09-22 NOTE — Progress Notes (Signed)
Subjective:   Barbara Tucker is a 77 y.o. female who presents for Medicare Annual (Subsequent) preventive examination.  Visit Complete: Virtual I connected with  Barbara Tucker on 09/22/23 by a audio enabled telemedicine application and verified that I am speaking with the correct person using two identifiers.  Patient Location: Home  Provider Location: Home Office  I discussed the limitations of evaluation and management by telemedicine. The patient expressed understanding and agreed to proceed.  Vital Signs: Because this visit was a virtual/telehealth visit, some criteria may be missing or patient reported. Any vitals not documented were not able to be obtained and vitals that have been documented are patient reported.  Patient Medicare AWV questionnaire was completed by the patient on 09/18/23; I have confirmed that all information answered by patient is correct and no changes since this date.  Cardiac Risk Factors include: advanced age (>53men, >66 women);hypertension     Objective:    Today's Vitals   09/22/23 0814  Weight: 150 lb (68 kg)  Height: 5\' 8"  (1.727 m)   Body mass index is 22.81 kg/m.     09/22/2023    8:26 AM 08/31/2022   12:40 PM 08/28/2021    2:31 PM 07/09/2021    9:04 PM 07/09/2021   12:19 PM 08/15/2020    1:46 PM 09/08/2016    9:52 AM  Advanced Directives  Does Patient Have a Medical Advance Directive? Yes Yes Yes Yes Yes Yes Yes  Type of Estate agent of [place];Living will Healthcare Power of [employer] of [place];Living will Healthcare Power of [place];Living will Healthcare Power of [place];Living will Healthcare Power of [place];Living will Healthcare Power of Attorney  Does patient want to make changes to medical advance directive? No - Patient declined   No - Patient declined     Copy of Healthcare Power of Attorney in Chart? Yes - validated most recent copy scanned in chart (See row information)  No - copy requested Yes - validated most recent copy scanned in chart (See row information) No - copy requested  No - copy requested     Current Medications (verified) Outpatient Encounter Medications as of 09/22/2023  Medication Sig   ascorbic acid (VITAMIN C) 500 MG tablet Take 500 mg by mouth daily.   calcium carbonate (OSCAL) 1500 (600 Ca) MG TABS tablet Take 600 mg of elemental calcium by mouth 2 (two) times daily with a meal.   Calcium Polycarbophil (FIBER-CAPS PO) Take by mouth.   estradiol (ESTRACE) 1 MG tablet Take 1 tablet (1 mg total) by mouth daily.   etodolac (LODINE) 400 MG tablet Take 1 tablet (400 mg total) by mouth 2 (two) times daily as needed.   fexofenadine (ALLEGRA) 180 MG tablet Take 180 mg by mouth daily.   folic acid (FOLVITE) 1 MG tablet Take 1 mg by mouth daily.   hydrochlorothiazide (HYDRODIURIL) 25 MG tablet Take 1 tablet (25 mg total) by mouth daily.   hydroxychloroquine (PLAQUENIL) 200 MG tablet TAKE 1 TABLET BY MOUTH TWICE DAILY ON TUESDAY, THURSDAY, SATURDAY, SUNDAY AND 1 TABLET ONCE DAILY ON MONDAY,WEDNESDAY,AND FRIDAY. TAKE WITH FOOD.   losartan (COZAAR) 50 MG tablet Take 1 tablet (50 mg total) by mouth daily.   methotrexate 2.5 MG tablet Take 20 mg by mouth once a week. Caution:Chemotherapy. Protect from light.   metoprolol succinate (TOPROL-XL) 25 MG 24 hr tablet Take 1 tablet (25 mg total) by mouth daily.   Multiple Vitamin (MULTIVITAMIN) tablet Take 1 tablet by  mouth daily.   Omega-3 Fatty Acids (OMEGA-3 EPA FISH OIL PO) Take 340-1,000 mg by mouth daily.   omeprazole (PRILOSEC) 20 MG capsule Take 20 mg by mouth daily.   RINVOQ 15 MG TB24 Take 15 mg by mouth daily.   dicyclomine (BENTYL) 10 MG capsule Take 1 capsule (10 mg total) by mouth 4 (four) times daily as needed for spasms. (Patient not taking: Reported on 09/22/2023)   No facility-administered encounter medications on file as of 09/22/2023.    Allergies (verified) Codeine, Lisinopril, and Sulfa  antibiotics   History: Past Medical History:  Diagnosis Date   Allergic rhinitis    Allergy    Endometriosis    Heart murmur 1975   minimal, seen on ultrasound long ago   Hypertension    Hypothyroidism    IBS (irritable bowel syndrome)    Migraine    Osteoarthritis    Rheumatoid arthritis (HCC)    Past Surgical History:  Procedure Laterality Date   APPENDECTOMY     BREAST EXCISIONAL BIOPSY Right 1975   excisional - negative   BREAST SURGERY  1975?   benign cyst removed   COLONOSCOPY WITH PROPOFOL N/A 02/03/2016   Procedure: COLONOSCOPY WITH PROPOFOL;  Surgeon: Christena Deem, MD;  Location: [facility] ENDOSCOPY;  Service: Endoscopy;  Laterality: N/A;   LEFT HEART CATH AND CORONARY ANGIOGRAPHY N/A 07/10/2021   Procedure: LEFT HEART CATH AND CORONARY ANGIOGRAPHY;  Surgeon: Iran Ouch, MD;  Location: [facility] INVASIVE CV LAB;  Service: Cardiovascular;  Laterality: N/A;   OOPHORECTOMY Bilateral    TONSILLECTOMY     TOTAL ABDOMINAL HYSTERECTOMY W/ BILATERAL SALPINGOOPHORECTOMY     TUBAL LIGATION     Family History  Problem Relation Age of Onset   Breast cancer Paternal Grandmother    Cancer Paternal Grandmother        Breast   Heart disease Paternal Grandmother    Stroke Mother    Arthritis Mother        Osteo and rheumatoid   Hypertension Mother    Angina Mother    Vision loss Mother    Cancer Father        Multiple Myeloma   Gout Father    Cancer Sister        Breast   Heart disease Sister    Cancer Sister        Skin   Cancer Brother        prostate   Social History   Socioeconomic History   Marital status: Widowed    Spouse name: Not on file   Number of children: Not on file   Years of education: Not on file   Highest education level: Master's degree (e.g., MA, MS, MEng, MEd, MSW, MBA)  Occupational History   Occupation: retired  Tobacco Use   Smoking status: Never    Passive exposure: Past   Smokeless tobacco: Never   Tobacco comments:    never  smoked, around lots of second hand  Vaping Use   Vaping status: Never Used  Substance and Sexual Activity   Alcohol use: Not Currently   Drug use: No   Sexual activity: Not Currently  Other Topics Concern   Not on file  Social History Narrative   Widowed, 2 step-children   Social Determinants of Health   Financial Resource Strain: Low Risk  (09/18/2023)   Overall Financial Resource Strain (CARDIA)    Difficulty of Paying Living Expenses: Not hard at all  Food Insecurity: No Food Insecurity (  09/18/2023)   Hunger Vital Sign    Worried About Running Out of Food in the Last Year: Never true    Ran Out of Food in the Last Year: Never true  Transportation Needs: No Transportation Needs (09/18/2023)   PRAPARE - Administrator, Civil Service (Medical): No    Lack of Transportation (Non-Medical): No  Physical Activity: Insufficiently Active (09/18/2023)   Exercise Vital Sign    Days of Exercise per Week: 3 days    Minutes of Exercise per Session: 20 min  Stress: No Stress Concern Present (09/18/2023)   [employer] of Occupational Health - Occupational Stress Questionnaire    Feeling of Stress : Only a little  Social Connections: Moderately Integrated (09/18/2023)   Social Connection and Isolation Panel [NHANES]    Frequency of Communication with Friends and Family: Three times a week    Frequency of Social Gatherings with Friends and Family: Once a week    Attends Religious Services: More than 4 times per year    Active Member of [employer] or Organizations: Yes    Attends Banker Meetings: More than 4 times per year    Marital Status: Widowed    Tobacco Counseling Counseling given: Not Answered Tobacco comments: never smoked, around lots of second hand   Clinical Intake:  Pre-visit preparation completed: Yes  Pain : No/denies pain     BMI - recorded: 22.81 Nutritional Status: BMI of 19-24  Normal Nutritional Risks: None Diabetes: No  How  often do you need to have someone help you when you read instructions, pamphlets, or other written materials from your doctor or pharmacy?: 1 - Never  Interpreter Needed?: No  Information entered by :: Barbara Kindred, CMA   Activities of Daily Living    09/18/2023    9:51 AM  In your present state of health, do you have any difficulty performing the following activities:  Hearing? 0  Vision? 0  Difficulty concentrating or making decisions? 0  Walking or climbing stairs? 0  Dressing or bathing? 0  Doing errands, shopping? 0  Preparing Food and eating ? N  Using the Toilet? N  In the past six months, have you accidently leaked urine? Y  Comment wears panty liner if needed  Do you have problems with loss of bowel control? N  Managing your Medications? N  Managing your Finances? N  Housekeeping or managing your Housekeeping? N    Patient Care Team: Dorcas Carrow, DO as PCP - General (Family Medicine) Antonieta Iba, MD as PCP - Cardiology (Cardiology)  Indicate any recent Medical Services you may have received from other than [facility] providers in the past year (date may be approximate).     Assessment:   This is a routine wellness examination for [facility].  Hearing/Vision screen Hearing Screening - Comments:: Denies hearing loss Vision Screening - Comments:: Gets eye exams   Goals Addressed               This Visit's Progress     Patient Stated (pt-stated)        Continue to get ready to down-size and move to [facility] and worry less about move.      Depression Screen    09/22/2023    8:22 AM 06/07/2023    1:43 PM 03/01/2023    2:22 PM 01/31/2023    3:25 PM 08/31/2022   12:42 PM 08/12/2022    1:36 PM 02/09/2022   10:07 AM  PHQ  2/9 Scores  PHQ - 2 Score 0 0 0 1 0 0 0  PHQ- 9 Score 0 1 2 2  0 1 2    Fall Risk    09/18/2023    9:51 AM 06/07/2023    1:42 PM 03/01/2023    2:22 PM 01/31/2023    3:24 PM 08/31/2022   12:36 PM  Fall Risk   Falls in the past year?  0 0 0 1 0  Number falls in past yr: 0 0 0 0 0  Injury with Fall? 0 0 0 0 0  Risk for fall due to : No Fall Risks No Fall Risks No Fall Risks History of fall(s)   Follow up Falls prevention discussed Falls evaluation completed Falls evaluation completed Falls evaluation completed Falls evaluation completed;Education provided;Falls prevention discussed    MEDICARE RISK AT HOME: Medicare Risk at Home Any stairs in or around the home?: Yes If so, are there any without handrails?: No Home free of loose throw rugs in walkways, pet beds, electrical cords, etc?: Yes Adequate lighting in your home to reduce risk of falls?: Yes Life alert?: No Use of a cane, walker or w/c?: No Grab bars in the bathroom?: Yes Shower chair or bench in shower?: Yes Elevated toilet seat or a handicapped toilet?: Yes  TIMED UP AND GO:  Was the test performed?  No    Cognitive Function:        09/22/2023    8:27 AM 08/31/2022   12:38 PM 08/28/2021    2:33 PM 08/15/2020    1:48 PM 07/27/2019    9:39 AM  6CIT Screen  What Year? 0 points 0 points 0 points 0 points 0 points  What month? 0 points 0 points 0 points 0 points 0 points  What time? 0 points 0 points 0 points 0 points 0 points  Count back from 20 0 points 0 points 0 points 0 points 0 points  Months in reverse 0 points 0 points 0 points 0 points 2 points  Repeat phrase 0 points 0 points 0 points 0 points 0 points  Total Score 0 points 0 points 0 points 0 points 2 points    Immunizations Immunization History  Administered Date(s) Administered   Fluad Quad(high Dose 65+) 07/27/2019, 08/05/2020, 08/11/2021, 08/12/2022   Influenza, High Dose Seasonal PF 08/08/2018   Influenza-Unspecified 07/24/2015, 09/17/2016   PFIZER Comirnaty(Gray Top)Covid-19 Tri-Sucrose Vaccine 03/13/2021   PFIZER(Purple Top)SARS-COV-2 Vaccination 01/04/2020, 01/29/2020, 08/28/2020, 09/16/2021   Pneumococcal Conjugate-13 09/06/2012, 11/30/2016   Pneumococcal Polysaccharide-23  07/25/2018   Td 07/25/2018   Zoster Recombinant(Shingrix) 02/10/2021, 04/13/2021   Zoster, Live 09/03/2010    TDAP status: Up to date  Flu Vaccine status: Due, Education has been provided regarding the importance of this vaccine. Advised may receive this vaccine at local pharmacy or Health Dept. Aware to provide a copy of the vaccination record if obtained from local pharmacy or Health Dept. Verbalized acceptance and understanding.  Pneumococcal vaccine status: Up to date  Covid-19 vaccine status: Information provided on how to obtain vaccines.   Qualifies for Shingles Vaccine? Yes   Zostavax completed No   Shingrix Completed?: Yes  Screening Tests Health Maintenance  Topic Date Due   INFLUENZA VACCINE  06/09/2023   COVID-19 Vaccine (6 - 2023-24 season) 07/10/2023   MAMMOGRAM  08/29/2024   Medicare Annual Wellness (AWV)  09/21/2024   DEXA SCAN  10/05/2025   DTaP/Tdap/Td (2 - Tdap) 07/25/2028   Pneumonia Vaccine 36+ Years old  Completed  Hepatitis C Screening  Completed   Zoster Vaccines- Shingrix  Completed   HPV VACCINES  Aged Out   Colonoscopy  Discontinued    Health Maintenance  Health Maintenance Due  Topic Date Due   INFLUENZA VACCINE  06/09/2023   COVID-19 Vaccine (6 - 2023-24 season) 07/10/2023    Colorectal cancer screening: No longer required.   Mammogram status: Completed 08/30/23. Repeat every year  Bone Density status: Completed 10/05/22. Results reflect: Bone density results: OSTEOPENIA. Repeat every 3 years.  Lung Cancer Screening: (Low Dose CT Chest recommended if Age 16-80 years, 20 pack-year currently smoking OR have quit w/in 15years.) does not qualify.   Lung Cancer Screening Referral: n/a  Additional Screening:  Hepatitis C Screening: does not qualify; Completed 05/26/18  Vision Screening: Recommended annual ophthalmology exams for early detection of glaucoma and other disorders of the eye. Dental Screening: Recommended annual dental exams  for proper oral hygiene   Community Resource Referral / Chronic Care Management: CRR required this visit?  No   CCM required this visit?  No     Plan:     I have personally reviewed and noted the following in the patient's chart:   Medical and social history Use of alcohol, tobacco or illicit drugs  Current medications and supplements including opioid prescriptions. Patient is not currently taking opioid prescriptions. Functional ability and status Nutritional status Physical activity Advanced directives List of other physicians Hospitalizations, surgeries, and ER visits in previous 12 months Vitals Screenings to include cognitive, depression, and falls Referrals and appointments  In addition, I have reviewed and discussed with patient certain preventive protocols, quality metrics, and best practice recommendations. A written personalized care plan for preventive services as well as general preventive health recommendations were provided to patient.     Barbara Kindred, CMA   09/22/2023   After Visit Summary: (MyChart) Due to this being a telephonic visit, the after visit summary with patients personalized plan was offered to patient via MyChart   Nurse Notes:  Needs flu shot at OV 09/26/23 Plans to get Covid booster a few weeks after the flu shot

## 2023-09-26 ENCOUNTER — Ambulatory Visit (INDEPENDENT_AMBULATORY_CARE_PROVIDER_SITE_OTHER): Payer: Medicare Other | Admitting: Family Medicine

## 2023-09-26 ENCOUNTER — Encounter: Payer: Self-pay | Admitting: Family Medicine

## 2023-09-26 VITALS — BP 110/65 | HR 65 | Temp 97.9°F | Resp 15 | Wt 153.8 lb

## 2023-09-26 DIAGNOSIS — E038 Other specified hypothyroidism: Secondary | ICD-10-CM

## 2023-09-26 DIAGNOSIS — I10 Essential (primary) hypertension: Secondary | ICD-10-CM

## 2023-09-26 DIAGNOSIS — I5181 Takotsubo syndrome: Secondary | ICD-10-CM | POA: Diagnosis not present

## 2023-09-26 DIAGNOSIS — Z0000 Encounter for general adult medical examination without abnormal findings: Secondary | ICD-10-CM

## 2023-09-26 LAB — MICROALBUMIN, URINE WAIVED
Creatinine, Urine Waived: 50 mg/dL (ref 10–300)
Microalb, Ur Waived: 10 mg/L (ref 0–19)

## 2023-09-26 MED ORDER — ESTRADIOL 1 MG PO TABS: 1.0000 mg | ORAL_TABLET | Freq: Every day | ORAL | 0 refills | Status: DC

## 2023-09-26 NOTE — Assessment & Plan Note (Signed)
Continue to follow with cardiology. Call with any concerns. Refills up to date.

## 2023-09-26 NOTE — Progress Notes (Signed)
BP 110/65 (BP Location: Left Arm, Patient Position: Sitting, Cuff Size: Normal)   Pulse 65   Temp 97.9 F (36.6 C) (Oral)   Resp 15   Wt 153 lb 12.8 oz (69.8 kg)   SpO2 97%   BMI 23.39 kg/m    Subjective:    Patient ID: Barbara Tucker, female    DOB: October 31, 1946, 77 y.o.   MRN: [id number]  HPI: Barbara Tucker is a 77 y.o. female presenting on 09/26/2023 for comprehensive medical examination. Current medical complaints include:  Moving to [facility] in March  HYPERTENSION  Hypertension status: controlled  Satisfied with current treatment? yes Duration of hypertension: chronic BP monitoring frequency:  not checking BP range:  BP medication side effects:  no Medication compliance: excellent compliance Previous BP meds:metoprolol, losartan, HCTZ Aspirin: no Recurrent headaches: no Visual changes: no Palpitations: no Dyspnea: no Chest pain: no Lower extremity edema: no Dizzy/lightheaded: no  SUBCLINICAL HYPOTHYROIDISM Thyroid control status:stable Satisfied with current treatment? yes Fatigue: no Cold intolerance: no Heat intolerance: no Weight gain: no Weight loss: no Constipation: no Diarrhea/loose stools: no Palpitations: no Lower extremity edema: no Anxiety/depressed mood: no  She currently lives with: alone Menopausal Symptoms: no  Depression Screen done today and results listed below:     09/26/2023    1:35 PM 09/22/2023    8:22 AM 06/07/2023    1:43 PM 03/01/2023    2:22 PM 01/31/2023    3:25 PM  Depression screen PHQ 2/9  Decreased Interest 0 0 0 0 0  Down, Depressed, Hopeless 0 0 0 0 1  PHQ - 2 Score 0 0 0 0 1  Altered sleeping 0 0 0 0 1  Tired, decreased energy 0 0 0 1 0  Change in appetite 0 0 0 0 0  Feeling bad or failure about yourself  0 0 0 0 0  Trouble concentrating 0 0 1 1 0  Moving slowly or fidgety/restless 0 0 0 0 0  Suicidal thoughts 0 0 0 0 0  PHQ-9 Score 0 0 1 2 2   Difficult doing work/chores Not difficult at all  Not difficult at all Not difficult at all Not difficult at all Not difficult at all    Past Medical History:  Past Medical History:  Diagnosis Date   Allergic rhinitis    Allergy    Endometriosis    Heart murmur 1975   minimal, seen on ultrasound long ago   Hypertension    Hypothyroidism    IBS (irritable bowel syndrome)    Migraine    Osteoarthritis    Rheumatoid arthritis (HCC)     Surgical History:  Past Surgical History:  Procedure Laterality Date   APPENDECTOMY     BREAST EXCISIONAL BIOPSY Right 1975   excisional - negative   BREAST SURGERY  1975?   benign cyst removed   COLONOSCOPY WITH PROPOFOL N/A 02/03/2016   Procedure: COLONOSCOPY WITH PROPOFOL;  Surgeon: Christena Deem, MD;  Location: [facility] ENDOSCOPY;  Service: Endoscopy;  Laterality: N/A;   LEFT HEART CATH AND CORONARY ANGIOGRAPHY N/A 07/10/2021   Procedure: LEFT HEART CATH AND CORONARY ANGIOGRAPHY;  Surgeon: Iran Ouch, MD;  Location: [facility] INVASIVE CV LAB;  Service: Cardiovascular;  Laterality: N/A;   OOPHORECTOMY Bilateral    TONSILLECTOMY     TOTAL ABDOMINAL HYSTERECTOMY W/ BILATERAL SALPINGOOPHORECTOMY     TUBAL LIGATION      Medications:  Current Outpatient Medications on File Prior to Visit  Medication Sig  ascorbic acid (VITAMIN C) 500 MG tablet Take 500 mg by mouth daily.   calcium carbonate (OSCAL) 1500 (600 Ca) MG TABS tablet Take 600 mg of elemental calcium by mouth 2 (two) times daily with a meal.   Calcium Polycarbophil (FIBER-CAPS PO) Take by mouth.   estradiol (ESTRACE) 1 MG tablet Take 1 tablet (1 mg total) by mouth daily.   etodolac (LODINE) 400 MG tablet Take 1 tablet (400 mg total) by mouth 2 (two) times daily as needed.   fexofenadine (ALLEGRA) 180 MG tablet Take 180 mg by mouth daily.   folic acid (FOLVITE) 1 MG tablet Take 1 mg by mouth daily.   hydrochlorothiazide (HYDRODIURIL) 25 MG tablet Take 1 tablet (25 mg total) by mouth daily.   hydroxychloroquine (PLAQUENIL) 200 MG  tablet TAKE 1 TABLET BY MOUTH TWICE DAILY ON TUESDAY, THURSDAY, SATURDAY, SUNDAY AND 1 TABLET ONCE DAILY ON MONDAY,WEDNESDAY,AND FRIDAY. TAKE WITH FOOD.   losartan (COZAAR) 50 MG tablet Take 1 tablet (50 mg total) by mouth daily.   methotrexate 2.5 MG tablet Take 20 mg by mouth once a week. Caution:Chemotherapy. Protect from light.   metoprolol succinate (TOPROL-XL) 25 MG 24 hr tablet Take 1 tablet (25 mg total) by mouth daily.   Multiple Vitamin (MULTIVITAMIN) tablet Take 1 tablet by mouth daily.   Omega-3 Fatty Acids (OMEGA-3 EPA FISH OIL PO) Take 340-1,000 mg by mouth daily.   omeprazole (PRILOSEC) 20 MG capsule Take 20 mg by mouth daily.   RINVOQ 15 MG TB24 Take 15 mg by mouth daily.   dicyclomine (BENTYL) 10 MG capsule Take 1 capsule (10 mg total) by mouth 4 (four) times daily as needed for spasms. (Patient not taking: Reported on 09/22/2023)   No current facility-administered medications on file prior to visit.    Allergies:  Allergies  Allergen Reactions   Codeine    Lisinopril Other (See Comments)    Migraine   Sulfa Antibiotics     Social History:  Social History   Socioeconomic History   Marital status: Widowed    Spouse name: Not on file   Number of children: Not on file   Years of education: Not on file   Highest education level: Master's degree (e.g., MA, MS, MEng, MEd, MSW, MBA)  Occupational History   Occupation: retired  Tobacco Use   Smoking status: Never    Passive exposure: Past   Smokeless tobacco: Never   Tobacco comments:    never smoked, around lots of second hand  Vaping Use   Vaping status: Never Used  Substance and Sexual Activity   Alcohol use: Not Currently   Drug use: No   Sexual activity: Not Currently  Other Topics Concern   Not on file  Social History Narrative   Widowed, 2 step-children   Social Determinants of Health   Financial Resource Strain: Low Risk  (09/22/2023)   Overall Financial Resource Strain (CARDIA)    Difficulty of  Paying Living Expenses: Not hard at all  Food Insecurity: No Food Insecurity (09/22/2023)   Hunger Vital Sign    Worried About Running Out of Food in the Last Year: Never true    Ran Out of Food in the Last Year: Never true  Transportation Needs: No Transportation Needs (09/22/2023)   PRAPARE - Administrator, Civil Service (Medical): No    Lack of Transportation (Non-Medical): No  Physical Activity: Insufficiently Active (09/22/2023)   Exercise Vital Sign    Days of Exercise per Week: 3  days    Minutes of Exercise per Session: 20 min  Stress: No Stress Concern Present (09/22/2023)   [employer] of Occupational Health - Occupational Stress Questionnaire    Feeling of Stress : Only a little  Social Connections: Moderately Integrated (09/22/2023)   Social Connection and Isolation Panel [NHANES]    Frequency of Communication with Friends and Family: More than three times a week    Frequency of Social Gatherings with Friends and Family: Three times a week    Attends Religious Services: More than 4 times per year    Active Member of Clubs or Organizations: Yes    Attends Banker Meetings: More than 4 times per year    Marital Status: Widowed  Intimate Partner Violence: Not At Risk (09/22/2023)   Humiliation, Afraid, Rape, and Kick questionnaire    Fear of Current or Ex-Partner: No    Emotionally Abused: No    Physically Abused: No    Sexually Abused: No   Social History   Tobacco Use  Smoking Status Never   Passive exposure: Past  Smokeless Tobacco Never  Tobacco Comments   never smoked, around lots of second hand   Social History   Substance and Sexual Activity  Alcohol Use Not Currently    Family History:  Family History  Problem Relation Age of Onset   Breast cancer Paternal Grandmother    Cancer Paternal Grandmother        Breast   Heart disease Paternal Grandmother    Stroke Mother    Arthritis Mother        Osteo and rheumatoid    Hypertension Mother    Angina Mother    Vision loss Mother    Cancer Father        Multiple Myeloma   Gout Father    Cancer Sister        Breast   Heart disease Sister    Cancer Sister        Skin   Cancer Brother        prostate    Past medical history, surgical history, medications, allergies, family history and social history reviewed with patient today and changes made to appropriate areas of the chart.   Review of Systems  Constitutional: Negative.   HENT: Negative.    Eyes:  Positive for blurred vision. Negative for double vision, photophobia, pain, discharge and redness.  Respiratory: Negative.    Cardiovascular: Negative.   Gastrointestinal: Negative.   Genitourinary: Negative.   Musculoskeletal:  Positive for joint pain. Negative for back pain, falls, myalgias and neck pain.  Skin: Negative.   Neurological: Negative.   Endo/Heme/Allergies: Negative.   Psychiatric/Behavioral: Negative.     All other ROS negative except what is listed above and in the HPI.      Objective:    BP 110/65 (BP Location: Left Arm, Patient Position: Sitting, Cuff Size: Normal)   Pulse 65   Temp 97.9 F (36.6 C) (Oral)   Resp 15   Wt 153 lb 12.8 oz (69.8 kg)   SpO2 97%   BMI 23.39 kg/m   Wt Readings from Last 3 Encounters:  09/26/23 153 lb 12.8 oz (69.8 kg)  09/22/23 150 lb (68 kg)  07/12/23 153 lb 6 oz (69.6 kg)    Physical Exam Vitals and nursing note reviewed.  Constitutional:      General: She is not in acute distress.    Appearance: Normal appearance. She is not ill-appearing, toxic-appearing or diaphoretic.  HENT:     Head: Normocephalic and atraumatic.     Right Ear: Tympanic membrane, ear canal and external ear normal. There is no impacted cerumen.     Left Ear: Tympanic membrane, ear canal and external ear normal. There is no impacted cerumen.     Nose: Nose normal. No congestion or rhinorrhea.     Mouth/Throat:     Mouth: Mucous membranes are moist.     Pharynx:  Oropharynx is clear. No oropharyngeal exudate or posterior oropharyngeal erythema.  Eyes:     General: No scleral icterus.       Right eye: No discharge.        Left eye: No discharge.     Extraocular Movements: Extraocular movements intact.     Conjunctiva/sclera: Conjunctivae normal.     Pupils: Pupils are equal, round, and reactive to light.  Neck:     Vascular: No carotid bruit.  Cardiovascular:     Rate and Rhythm: Normal rate and regular rhythm.     Pulses: Normal pulses.     Heart sounds: No murmur heard.    No friction rub. No gallop.  Pulmonary:     Effort: Pulmonary effort is normal. No respiratory distress.     Breath sounds: Normal breath sounds. No stridor. No wheezing, rhonchi or rales.  Chest:     Chest wall: No tenderness.  Abdominal:     General: Abdomen is flat. Bowel sounds are normal. There is no distension.     Palpations: Abdomen is soft. There is no mass.     Tenderness: There is no abdominal tenderness. There is no right CVA tenderness, left CVA tenderness, guarding or rebound.     Hernia: No hernia is present.  Genitourinary:    Comments: Breast and pelvic exams deferred with shared decision making Musculoskeletal:        General: No swelling, tenderness, deformity or signs of injury.     Cervical back: Normal range of motion and neck supple. No rigidity. No muscular tenderness.     Right lower leg: No edema.     Left lower leg: No edema.  Lymphadenopathy:     Cervical: No cervical adenopathy.  Skin:    General: Skin is warm and dry.     Capillary Refill: Capillary refill takes less than 2 seconds.     Coloration: Skin is not jaundiced or pale.     Findings: No bruising, erythema, lesion or rash.  Neurological:     General: No focal deficit present.     Mental Status: She is alert and oriented to person, place, and time. Mental status is at baseline.     Cranial Nerves: No cranial nerve deficit.     Sensory: No sensory deficit.     Motor: No  weakness.     Coordination: Coordination normal.     Gait: Gait normal.     Deep Tendon Reflexes: Reflexes normal.  Psychiatric:        Mood and Affect: Mood normal.        Behavior: Behavior normal.        Thought Content: Thought content normal.        Judgment: Judgment normal.     Results for orders placed or performed in visit on 06/08/23  HM DEXA SCAN  Result Value Ref Range   HM Dexa Scan see report scanned into chart       Assessment & Plan:   Problem List Items Addressed This Visit  Cardiovascular and Mediastinum   Hypertension    Under good control on current regimen. Continue current regimen. Continue to monitor. Call with any concerns. Refills given by cardiology. Labs drawn today.        Relevant Orders   CBC with Differential/Platelet   Comprehensive metabolic panel   Microalbumin, Urine Waived   Flu Vaccine Trivalent High Dose (Fluad) (Completed)   Takotsubo cardiomyopathy    Continue to follow with cardiology. Call with any concerns. Refills up to date.       Relevant Orders   CBC with Differential/Platelet   Comprehensive metabolic panel   Lipid Panel w/o Chol/HDL Ratio   Flu Vaccine Trivalent High Dose (Fluad) (Completed)     Endocrine   Subclinical hypothyroidism    Rechecking labs today. Await results. Treat as needed.       Relevant Orders   CBC with Differential/Platelet   Comprehensive metabolic panel   TSH   Flu Vaccine Trivalent High Dose (Fluad) (Completed)   Other Visit Diagnoses     Routine general medical examination at a health care facility    -  Primary   Vaccines up to date. Screening labs checked today. Mammo and DEXA up to date. Continue diet and exercise. Call with any concerns.   Relevant Orders   Flu Vaccine Trivalent High Dose (Fluad) (Completed)        Follow up plan: Return in about 6 months (around 03/25/2024).   LABORATORY TESTING:  - Pap smear: not applicable  IMMUNIZATIONS:   - Tdap: Tetanus  vaccination status reviewed: last tetanus booster within 10 years. - Influenza: Administered today - Pneumovax: Up to date - Prevnar: Up to date - COVID:  Will get in a couple of weeks - HPV: Not applicable - Shingrix vaccine: Up to date  SCREENING: -Mammogram: Up to date  - Colonoscopy: Not applicable  - Bone Density: Up to date   PATIENT COUNSELING:   Advised to take 1 mg of folate supplement per day if capable of pregnancy.   Sexuality: Discussed sexually transmitted diseases, partner selection, use of condoms, avoidance of unintended pregnancy  and contraceptive alternatives.   Advised to avoid cigarette smoking.  I discussed with the patient that most people either abstain from alcohol or drink within safe limits (<=14/week and <=4 drinks/occasion for males, <=7/weeks and <= 3 drinks/occasion for females) and that the risk for alcohol disorders and other health effects rises proportionally with the number of drinks per week and how often a drinker exceeds daily limits.  Discussed cessation/primary prevention of drug use and availability of treatment for abuse.   Diet: Encouraged to adjust caloric intake to maintain  or achieve ideal body weight, to reduce intake of dietary saturated fat and total fat, to limit sodium intake by avoiding high sodium foods and not adding table salt, and to maintain adequate dietary potassium and calcium preferably from fresh fruits, vegetables, and low-fat dairy products.    stressed the importance of regular exercise  Injury prevention: Discussed safety belts, safety helmets, smoke detector, smoking near bedding or upholstery.   Dental health: Discussed importance of regular tooth brushing, flossing, and dental visits.    NEXT PREVENTATIVE PHYSICAL DUE IN 1 YEAR. Return in about 6 months (around 03/25/2024).

## 2023-09-26 NOTE — Assessment & Plan Note (Signed)
Rechecking labs today. Await results. Treat as needed.  °

## 2023-09-26 NOTE — Assessment & Plan Note (Signed)
Under good control on current regimen. Continue current regimen. Continue to monitor. Call with any concerns. Refills given by cardiology. Labs drawn today.

## 2023-09-27 LAB — COMPREHENSIVE METABOLIC PANEL
ALT: 19 [IU]/L (ref 0–32)
AST: 20 [IU]/L (ref 0–40)
Albumin: 4.5 g/dL (ref 3.8–4.8)
Alkaline Phosphatase: 49 [IU]/L (ref 44–121)
BUN/Creatinine Ratio: 15 (ref 12–28)
BUN: 11 mg/dL (ref 8–27)
Bilirubin Total: 0.5 mg/dL (ref 0.0–1.2)
CO2: 25 mmol/L (ref 20–29)
Calcium: 10 mg/dL (ref 8.7–10.3)
Chloride: 96 mmol/L (ref 96–106)
Creatinine, Ser: 0.75 mg/dL (ref 0.57–1.00)
Globulin, Total: 2.4 g/dL (ref 1.5–4.5)
Glucose: 110 mg/dL — ABNORMAL HIGH (ref 70–99)
Potassium: 3.8 mmol/L (ref 3.5–5.2)
Sodium: 135 mmol/L (ref 134–144)
Total Protein: 6.9 g/dL (ref 6.0–8.5)
eGFR: 82 mL/min/{1.73_m2} (ref >59)

## 2023-09-27 LAB — CBC WITH DIFFERENTIAL/PLATELET
Basophils Absolute: 0 10*3/uL (ref 0.0–0.2)
Basos: 0 %
EOS (ABSOLUTE): 0.1 10*3/uL (ref 0.0–0.4)
Eos: 1 %
Hematocrit: 37.7 % (ref 34.0–46.6)
Hemoglobin: 12.8 g/dL (ref 11.1–15.9)
Immature Grans (Abs): 0 10*3/uL (ref 0.0–0.1)
Immature Granulocytes: 0 %
Lymphocytes Absolute: 2.2 10*3/uL (ref 0.7–3.1)
Lymphs: 34 %
MCH: 33.3 pg — ABNORMAL HIGH (ref 26.6–33.0)
MCHC: 34 g/dL (ref 31.5–35.7)
MCV: 98 fL — ABNORMAL HIGH (ref 79–97)
Monocytes Absolute: 0.5 10*3/uL (ref 0.1–0.9)
Monocytes: 8 %
Neutrophils Absolute: 3.6 10*3/uL (ref 1.4–7.0)
Neutrophils: 57 %
Platelets: 344 10*3/uL (ref 150–450)
RBC: 3.84 x10E6/uL (ref 3.77–5.28)
RDW: 12.7 % (ref 11.7–15.4)
WBC: 6.4 10*3/uL (ref 3.4–10.8)

## 2023-09-27 LAB — LIPID PANEL W/O CHOL/HDL RATIO
Cholesterol, Total: 189 mg/dL (ref 100–199)
HDL: 97 mg/dL (ref >39)
LDL Chol Calc (NIH): 63 mg/dL (ref 0–99)
Triglycerides: 179 mg/dL — ABNORMAL HIGH (ref 0–149)
VLDL Cholesterol Cal: 29 mg/dL (ref 5–40)

## 2023-09-27 LAB — TSH: TSH: 2.56 u[IU]/mL (ref 0.450–4.500)

## 2023-10-20 DIAGNOSIS — Z79899 Other long term (current) drug therapy: Secondary | ICD-10-CM | POA: Diagnosis not present

## 2023-10-20 DIAGNOSIS — M0579 Rheumatoid arthritis with rheumatoid factor of multiple sites without organ or systems involvement: Secondary | ICD-10-CM | POA: Diagnosis not present

## 2023-10-21 DIAGNOSIS — Z79899 Other long term (current) drug therapy: Secondary | ICD-10-CM | POA: Diagnosis not present

## 2023-10-28 DIAGNOSIS — H2513 Age-related nuclear cataract, bilateral: Secondary | ICD-10-CM | POA: Diagnosis not present

## 2023-10-28 DIAGNOSIS — Z79899 Other long term (current) drug therapy: Secondary | ICD-10-CM | POA: Diagnosis not present

## 2023-10-28 DIAGNOSIS — M069 Rheumatoid arthritis, unspecified: Secondary | ICD-10-CM | POA: Diagnosis not present

## 2024-01-17 ENCOUNTER — Other Ambulatory Visit: Payer: Self-pay | Admitting: Family Medicine

## 2024-01-18 NOTE — Telephone Encounter (Signed)
 Requested Prescriptions  Pending Prescriptions Disp Refills   estradiol (ESTRACE) 1 MG tablet [Pharmacy Med Name: ESTRADIOL 1 MG TAB] 90 tablet 0    Sig: TAKE 1 TABLET BY MOUTH DAILY     OB/GYN:  Estrogens Passed - 01/18/2024 11:08 AM      Passed - Mammogram is up-to-date per Health Maintenance      Passed - Last BP in normal range    BP Readings from Last 1 Encounters:  09/26/23 110/65         Passed - Valid encounter within last 12 months    Recent Outpatient Visits           3 months ago Routine general medical examination at a health care facility   [facility], Megan P, DO   7 months ago Primary hypertension   [place] [facility] [place], Megan P, DO   10 months ago Grief   [place] [facility] [place], [place], DO   11 months ago Vasovagal syncope   [place] [facility] [place], [place], DO   1 year ago Primary hypertension   [place] [facility] [place], Oralia Rud, DO       Future Appointments             In 2 months Laural Benes, Oralia Rud, DO  [facility]

## 2024-01-20 DIAGNOSIS — D2261 Melanocytic nevi of right upper limb, including shoulder: Secondary | ICD-10-CM | POA: Diagnosis not present

## 2024-01-20 DIAGNOSIS — D2262 Melanocytic nevi of left upper limb, including shoulder: Secondary | ICD-10-CM | POA: Diagnosis not present

## 2024-01-20 DIAGNOSIS — D2272 Melanocytic nevi of left lower limb, including hip: Secondary | ICD-10-CM | POA: Diagnosis not present

## 2024-01-20 DIAGNOSIS — D225 Melanocytic nevi of trunk: Secondary | ICD-10-CM | POA: Diagnosis not present

## 2024-02-07 DIAGNOSIS — Z79899 Other long term (current) drug therapy: Secondary | ICD-10-CM | POA: Diagnosis not present

## 2024-02-07 DIAGNOSIS — M0579 Rheumatoid arthritis with rheumatoid factor of multiple sites without organ or systems involvement: Secondary | ICD-10-CM | POA: Diagnosis not present

## 2024-02-08 ENCOUNTER — Encounter: Payer: Self-pay | Admitting: Family Medicine

## 2024-02-08 DIAGNOSIS — M0579 Rheumatoid arthritis with rheumatoid factor of multiple sites without organ or systems involvement: Secondary | ICD-10-CM | POA: Diagnosis not present

## 2024-02-08 DIAGNOSIS — Z79899 Other long term (current) drug therapy: Secondary | ICD-10-CM | POA: Diagnosis not present

## 2024-03-26 ENCOUNTER — Encounter: Payer: Self-pay | Admitting: Family Medicine

## 2024-03-26 ENCOUNTER — Ambulatory Visit (INDEPENDENT_AMBULATORY_CARE_PROVIDER_SITE_OTHER): Payer: Self-pay | Admitting: Family Medicine

## 2024-03-26 VITALS — BP 131/74 | HR 58 | Ht 67.0 in | Wt 150.0 lb

## 2024-03-26 DIAGNOSIS — I10 Essential (primary) hypertension: Secondary | ICD-10-CM

## 2024-03-26 DIAGNOSIS — E038 Other specified hypothyroidism: Secondary | ICD-10-CM | POA: Diagnosis not present

## 2024-03-26 DIAGNOSIS — M059 Rheumatoid arthritis with rheumatoid factor, unspecified: Secondary | ICD-10-CM | POA: Diagnosis not present

## 2024-03-26 MED ORDER — ESTRADIOL 1 MG PO TABS: 1.0000 mg | ORAL_TABLET | Freq: Every day | ORAL | 1 refills | Status: DC

## 2024-03-26 NOTE — Assessment & Plan Note (Signed)
 Under good control on current regimen. Continue current regimen. Continue to monitor. Call with any concerns. Refills given. Labs drawn today.

## 2024-03-26 NOTE — Assessment & Plan Note (Signed)
 Acting up a bit. Follows with [facility] rheumatology. Call with any concerns.

## 2024-03-26 NOTE — Progress Notes (Signed)
 BP 131/74 (BP Location: Left Arm, Patient Position: Sitting, Cuff Size: Normal)   Pulse (!) 58   Ht 5\' 7"  (1.702 m)   Wt 150 lb (68 kg)   SpO2 97%   BMI 23.49 kg/m    Subjective:    Patient ID: Barbara Tucker, female    DOB: 01/10/1946, 78 y.o.   MRN: [id number]  HPI: Barbara Tucker is a 78 y.o. female  Chief Complaint  Patient presents with   Hypertension   Moved into [place] in March. She had a lot of birthdays and anniversaries and had to do significant downsizing. She has been dealing with a lot. She is happy with her new place.   HYPERTENSION  Hypertension status: controlled  Satisfied with current treatment? yes Duration of hypertension: chronic BP monitoring frequency:  not checking BP medication side effects:  no Medication compliance: excellent compliance Previous BP meds:hydrochlorothiazide , losartan , metoprolol  Aspirin : no Recurrent headaches: no Visual changes: no Palpitations: no Dyspnea: no Chest pain: no Lower extremity edema: no Dizzy/lightheaded: no  SUBCLINICAL HYPOTHYROIDISM Thyroid  control status: stable Satisfied with current treatment? yes Medication side effects: N/A Fatigue: yes Cold intolerance: no Heat intolerance: no Weight gain: no Weight loss: no Constipation: no Diarrhea/loose stools: no Palpitations: no Lower extremity edema: no Anxiety/depressed mood: yes  Relevant past medical, surgical, family and social history reviewed and updated as indicated. Interim medical history since our last visit reviewed. Allergies and medications reviewed and updated.  Review of Systems  Constitutional: Negative.   Respiratory: Negative.    Cardiovascular: Negative.   Musculoskeletal:  Positive for arthralgias. Negative for back pain, gait problem, joint swelling, myalgias, neck pain and neck stiffness.  Skin: Negative.   Neurological: Negative.   Psychiatric/Behavioral:  Negative for agitation, behavioral problems,  confusion, decreased concentration, dysphoric mood, hallucinations, self-injury, sleep disturbance and suicidal ideas. The patient is nervous/anxious. The patient is not hyperactive.     Per HPI unless specifically indicated above     Objective:     BP 131/74 (BP Location: Left Arm, Patient Position: Sitting, Cuff Size: Normal)   Pulse (!) 58   Ht 5\' 7"  (1.702 m)   Wt 150 lb (68 kg)   SpO2 97%   BMI 23.49 kg/m   Wt Readings from Last 3 Encounters:  03/26/24 150 lb (68 kg)  09/26/23 153 lb 12.8 oz (69.8 kg)  09/22/23 150 lb (68 kg)    Physical Exam Vitals and nursing note reviewed.  Constitutional:      General: She is not in acute distress.    Appearance: Normal appearance. She is not ill-appearing, toxic-appearing or diaphoretic.  HENT:     Head: Normocephalic and atraumatic.     Right Ear: External ear normal.     Left Ear: External ear normal.     Nose: Nose normal.     Mouth/Throat:     Mouth: Mucous membranes are moist.     Pharynx: Oropharynx is clear.  Eyes:     General: No scleral icterus.       Right eye: No discharge.        Left eye: No discharge.     Extraocular Movements: Extraocular movements intact.     Conjunctiva/sclera: Conjunctivae normal.     Pupils: Pupils are equal, round, and reactive to light.  Cardiovascular:     Rate and Rhythm: Normal rate and regular rhythm.     Pulses: Normal pulses.     Heart sounds: Normal heart sounds. No murmur  heard.    No friction rub. No gallop.  Pulmonary:     Effort: Pulmonary effort is normal. No respiratory distress.     Breath sounds: Normal breath sounds. No stridor. No wheezing, rhonchi or rales.  Chest:     Chest wall: No tenderness.  Musculoskeletal:        General: Normal range of motion.     Cervical back: Normal range of motion and neck supple.  Skin:    General: Skin is warm and dry.     Capillary Refill: Capillary refill takes less than 2 seconds.     Coloration: Skin is not jaundiced or pale.      Findings: No bruising, erythema, lesion or rash.  Neurological:     General: No focal deficit present.     Mental Status: She is alert and oriented to person, place, and time. Mental status is at baseline.  Psychiatric:        Mood and Affect: Mood normal.        Behavior: Behavior normal.        Thought Content: Thought content normal.        Judgment: Judgment normal.     Results for orders placed or performed in visit on 09/26/23  Microalbumin, Urine Waived   Collection Time: 09/26/23  1:39 PM  Result Value Ref Range   Microalb, Ur Waived 10 0 - 19 mg/L   Creatinine, Urine Waived 50 10 - 300 mg/dL   Microalb/Creat Ratio 30-300 (H) <30 mg/g  CBC with Differential/Platelet   Collection Time: 09/26/23  1:41 PM  Result Value Ref Range   WBC 6.4 3.4 - 10.8 x10E3/uL   RBC 3.84 3.77 - 5.28 x10E6/uL   Hemoglobin 12.8 11.1 - 15.9 g/dL   Hematocrit 16.1 09.6 - 46.6 %   MCV 98 (H) 79 - 97 fL   MCH 33.3 (H) 26.6 - 33.0 pg   MCHC 34.0 31.5 - 35.7 g/dL   RDW 04.5 40.9 - 81.1 %   Platelets 344 150 - 450 x10E3/uL   Neutrophils 57 Not Estab. %   Lymphs 34 Not Estab. %   Monocytes 8 Not Estab. %   Eos 1 Not Estab. %   Basos 0 Not Estab. %   Neutrophils Absolute 3.6 1.4 - 7.0 x10E3/uL   Lymphocytes Absolute 2.2 0.7 - 3.1 x10E3/uL   Monocytes Absolute 0.5 0.1 - 0.9 x10E3/uL   EOS (ABSOLUTE) 0.1 0.0 - 0.4 x10E3/uL   Basophils Absolute 0.0 0.0 - 0.2 x10E3/uL   Immature Granulocytes 0 Not Estab. %   Immature Grans (Abs) 0.0 0.0 - 0.1 x10E3/uL  Comprehensive metabolic panel   Collection Time: 09/26/23  1:41 PM  Result Value Ref Range   Glucose 110 (H) 70 - 99 mg/dL   BUN 11 8 - 27 mg/dL   Creatinine, Ser 9.14 0.57 - 1.00 mg/dL   eGFR 82 >78 GN/FAO/1.30   BUN/Creatinine Ratio 15 12 - 28   Sodium 135 134 - 144 mmol/L   Potassium 3.8 3.5 - 5.2 mmol/L   Chloride 96 96 - 106 mmol/L   CO2 25 20 - 29 mmol/L   Calcium  10.0 8.7 - 10.3 mg/dL   Total Protein 6.9 6.0 - 8.5 g/dL   Albumin  4.5 3.8 - 4.8 g/dL   Globulin, Total 2.4 1.5 - 4.5 g/dL   Bilirubin Total 0.5 0.0 - 1.2 mg/dL   Alkaline Phosphatase 49 44 - 121 IU/L   AST 20 0 - 40 IU/L  ALT 19 0 - 32 IU/L  Lipid Panel w/o Chol/HDL Ratio   Collection Time: 09/26/23  1:41 PM  Result Value Ref Range   Cholesterol, Total 189 100 - 199 mg/dL   Triglycerides 782 (H) 0 - 149 mg/dL   HDL 97 >95 mg/dL   VLDL Cholesterol Cal 29 5 - 40 mg/dL   LDL Chol Calc (NIH) 63 0 - 99 mg/dL  TSH   Collection Time: 09/26/23  1:41 PM  Result Value Ref Range   TSH 2.560 0.450 - 4.500 uIU/mL      Assessment & Plan:   Problem List Items Addressed This Visit       Cardiovascular and Mediastinum   Hypertension - Primary   Under good control on current regimen. Continue current regimen. Continue to monitor. Call with any concerns. Refills given. Labs drawn today.       Relevant Orders   Basic metabolic panel with GFR     Endocrine   Subclinical hypothyroidism   Relevant Orders   TSH     Musculoskeletal and Integument   Rheumatoid arthritis (HCC)   Acting up a bit. Follows with [facility] rheumatology. Call with any concerns.         Follow up plan: Return in about 6 months (around 09/26/2024) for physical.

## 2024-03-27 LAB — BASIC METABOLIC PANEL WITH GFR
BUN/Creatinine Ratio: 15 (ref 12–28)
BUN: 12 mg/dL (ref 8–27)
CO2: 23 mmol/L (ref 20–29)
Calcium: 10 mg/dL (ref 8.7–10.3)
Chloride: 94 mmol/L — ABNORMAL LOW (ref 96–106)
Creatinine, Ser: 0.8 mg/dL (ref 0.57–1.00)
Glucose: 107 mg/dL — ABNORMAL HIGH (ref 70–99)
Potassium: 4 mmol/L (ref 3.5–5.2)
Sodium: 131 mmol/L — ABNORMAL LOW (ref 134–144)
eGFR: 75 mL/min/{1.73_m2} (ref >59)

## 2024-03-27 LAB — TSH: TSH: 2.57 u[IU]/mL (ref 0.450–4.500)

## 2024-03-30 ENCOUNTER — Ambulatory Visit: Payer: Self-pay | Admitting: Family Medicine

## 2024-05-07 ENCOUNTER — Encounter: Payer: Self-pay | Admitting: Family Medicine

## 2024-05-08 NOTE — Telephone Encounter (Signed)
 I can see her tomorrow at 3:20 double book if she'd like to come in

## 2024-05-09 ENCOUNTER — Ambulatory Visit (INDEPENDENT_AMBULATORY_CARE_PROVIDER_SITE_OTHER): Admitting: Family Medicine

## 2024-05-09 ENCOUNTER — Encounter: Payer: Self-pay | Admitting: Family Medicine

## 2024-05-09 VITALS — BP 138/74 | HR 60 | Temp 98.3°F | Ht 68.0 in | Wt 152.6 lb

## 2024-05-09 DIAGNOSIS — M25512 Pain in left shoulder: Secondary | ICD-10-CM

## 2024-05-09 MED ORDER — PREDNISONE 10 MG PO TABS: ORAL_TABLET | ORAL | 0 refills | Status: DC

## 2024-05-09 MED ORDER — BACLOFEN 10 MG PO TABS: 5.0000 mg | ORAL_TABLET | Freq: Every evening | ORAL | 0 refills | Status: AC | PRN

## 2024-05-09 NOTE — Progress Notes (Signed)
 BP 138/74   Pulse 60   Temp 98.3 F (36.8 C)   Ht 5' 8 (1.727 m)   Wt 152 lb 9.6 oz (69.2 kg)   SpO2 98%   BMI 23.20 kg/m    Subjective:    Patient ID: Barbara Tucker, female    DOB: April 23, 1946, 78 y.o.   MRN: [id number]  HPI: Barbara Tucker is a 78 y.o. female  Chief Complaint  Patient presents with   Shoulder Pain    Pt stated possible injury--Left shoulder -tingling, pain radiated to the hand, worse at night.   SHOULDER PAIN Duration: 4-5 days Involved shoulder: left Mechanism of injury: no trauma, but cleaning out a house Location: diffuse and into L bicep Onset:sudden Severity: moderate  Quality:  cramping, sore, overworked muscle  Frequency: intermittent Radiation: yes- down to the back of the hand Aggravating factors: sleep  Alleviating factors: during the day it's not really bothering her and rest  Status: worse Treatments attempted: changing her positions, etodalac  Relief with NSAIDs?:  moderate Weakness: yes Numbness: no Decreased grip strength: yes Redness: no Swelling: no Bruising: no Fevers: no  Relevant past medical, surgical, family and social history reviewed and updated as indicated. Interim medical history since our last visit reviewed. Allergies and medications reviewed and updated.  Review of Systems  Constitutional: Negative.   Respiratory: Negative.    Cardiovascular: Negative.   Musculoskeletal:  Positive for arthralgias and joint swelling. Negative for back pain, gait problem, myalgias, neck pain and neck stiffness.  Skin: Negative.   Psychiatric/Behavioral: Negative.      Per HPI unless specifically indicated above     Objective:    BP 138/74   Pulse 60   Temp 98.3 F (36.8 C)   Ht 5' 8 (1.727 m)   Wt 152 lb 9.6 oz (69.2 kg)   SpO2 98%   BMI 23.20 kg/m   Wt Readings from Last 3 Encounters:  05/09/24 152 lb 9.6 oz (69.2 kg)  03/26/24 150 lb (68 kg)  09/26/23 153 lb 12.8 oz (69.8 kg)    Physical  Exam Vitals and nursing note reviewed.  Constitutional:      General: She is not in acute distress.    Appearance: Normal appearance. She is well-developed.  HENT:     Head: Normocephalic and atraumatic.     Right Ear: Hearing and external ear normal.     Left Ear: Hearing and external ear normal.     Nose: Nose normal.     Mouth/Throat:     Mouth: Mucous membranes are moist.     Pharynx: Oropharynx is clear.  Eyes:     General: Lids are normal. No scleral icterus.       Right eye: No discharge.        Left eye: No discharge.     Conjunctiva/sclera: Conjunctivae normal.  Pulmonary:     Effort: Pulmonary effort is normal. No respiratory distress.  Musculoskeletal:        General: No swelling, tenderness, deformity or signs of injury. Normal range of motion.     Right lower leg: No edema.     Left lower leg: No edema.     Comments: Hypertonic L trap with some tenderness  Skin:    Coloration: Skin is not jaundiced or pale.     Findings: No bruising, erythema, lesion or rash.  Neurological:     Mental Status: She is alert. Mental status is at baseline. She is disoriented.  Psychiatric:        Mood and Affect: Mood normal.        Speech: Speech normal.        Behavior: Behavior normal.        Thought Content: Thought content normal.        Judgment: Judgment normal.     Results for orders placed or performed in visit on 03/26/24  Basic metabolic panel with GFR   Collection Time: 03/26/24  1:43 PM  Result Value Ref Range   Glucose 107 (H) 70 - 99 mg/dL   BUN 12 8 - 27 mg/dL   Creatinine, Ser 9.19 0.57 - 1.00 mg/dL   eGFR 75 >40 fO/fpw/8.26   BUN/Creatinine Ratio 15 12 - 28   Sodium 131 (L) 134 - 144 mmol/L   Potassium 4.0 3.5 - 5.2 mmol/L   Chloride 94 (L) 96 - 106 mmol/L   CO2 23 20 - 29 mmol/L   Calcium  10.0 8.7 - 10.3 mg/dL  TSH   Collection Time: 03/26/24  1:43 PM  Result Value Ref Range   TSH 2.570 0.450 - 4.500 uIU/mL      Assessment & Plan:   Problem List  Items Addressed This Visit   None Visit Diagnoses       Acute pain of left shoulder    -  Primary   Will treat with baclofen and prednisone taper. Call with any concerns or if not getting better.        Follow up plan: Return for As scheduled.

## 2024-06-13 DIAGNOSIS — Z79899 Other long term (current) drug therapy: Secondary | ICD-10-CM | POA: Diagnosis not present

## 2024-06-13 DIAGNOSIS — M0579 Rheumatoid arthritis with rheumatoid factor of multiple sites without organ or systems involvement: Secondary | ICD-10-CM | POA: Diagnosis not present

## 2024-07-12 NOTE — Progress Notes (Unsigned)
 Cardiology Office Note  Date:  07/13/2024   ID:  Barbara Tucker, Barbara Tucker Oct 18, 1946, MRN [id number]  PCP:  Vicci Duwaine SQUIBB, DO   Chief Complaint  Patient presents with   12 month follow up     Doing well. Patient denies chest pain or shortness of breath.     HPI:  Barbara Tucker is a 78 y.o. female with PMH  Nonischemic cardiomyopathy Ejection fraction 40 to 45% September 2022 Normal coronary arteries, concern for stress cardiomyopathy September 2022 Echo ejection fraction 60 to 65% December 2022 EF 55 to 60% in 7/24 severe Stage 3 rheumatoid arthritis  Who presents for follow-up of her nonischemic cardiomyopathy  LOV 9/24 In follow-up today reports she is doing relatively well Stress over the past 2 years since her husband died Looking to get her house ready to sell Has appointments later today with electrician Will be planning an estate sale She has moved into [facility], excited to start doing activities there  Diagnosed with RA, followed by rheumatology on plaquenil and methotrexate  and rinvoq  Denies any near-syncope or syncope  01/28/2023 presented to the emergency department after syncopal episode.  Felt to be a vasovagal spell  Husband passed away 2023/03/03 Echocardiogram May 31, 2023 Ejection fraction 55 to 60%, normal RV size and function  EKG personally reviewed by myself on todays visit EKG Interpretation Date/Time:  Friday July 13 2024 09:40:04 EDT Ventricular Rate:  65 PR Interval:  130 QRS Duration:  94 QT Interval:  408 QTC Calculation: 424 R Axis:   -9  Text Interpretation: Normal sinus rhythm Normal ECG When compared with ECG of 09-May-2023 13:58, No significant change was found Confirmed by Perla Lye [phone]) on 07/13/2024 10:01:21 AM    Past cardiac history reviewed Stressful situation in 07/2021, had to give a presentation, in the heat Acute SOB [facility] ED, HS [address]  2014.0.   EKG NSR, 61 bpm, poor R wave progression in  III, V1-V3, nonspecific changes.   TSH elevated at 5.032.  LDL 72.   CT without evidence of PE.   Carotids without hemodynamically significant stenosis.   9/22:  Echo EF 45-50%.  LHC showed nl cors, mildly to moderately reduced LVSF with EF 40% and WMA thought most consistent with Takotsubo CM.   Echo 10/21/21  1. Left ventricular ejection fraction, by estimation, is 60 to 65%. The  left ventricle has normal function. The left ventricle has no regional  wall motion abnormalities.   2. The mitral valve is normal in structure. No evidence of mitral valve  regurgitation.    PMH:   has a past medical history of Allergic rhinitis, Allergy, Endometriosis, Heart murmur (1975), Hypertension, Hypothyroidism, IBS (irritable bowel syndrome), Migraine, Osteoarthritis, and Rheumatoid arthritis (HCC).  PSH:    Past Surgical History:  Procedure Laterality Date   APPENDECTOMY     BREAST EXCISIONAL BIOPSY Right 1975   excisional - negative   BREAST SURGERY  1975?   benign cyst removed   COLONOSCOPY WITH PROPOFOL  N/A 02/03/2016   Procedure: COLONOSCOPY WITH PROPOFOL ;  Surgeon: Gladis RAYMOND Mariner, MD;  Location: [facility] ENDOSCOPY;  Service: Endoscopy;  Laterality: N/A;   LEFT HEART CATH AND CORONARY ANGIOGRAPHY N/A 07/10/2021   Procedure: LEFT HEART CATH AND CORONARY ANGIOGRAPHY;  Surgeon: Darron Deatrice LABOR, MD;  Location: [facility] INVASIVE CV LAB;  Service: Cardiovascular;  Laterality: N/A;   OOPHORECTOMY Bilateral    TONSILLECTOMY     TOTAL ABDOMINAL HYSTERECTOMY W/ BILATERAL SALPINGOOPHORECTOMY  TUBAL LIGATION      Current Outpatient Medications  Medication Sig Dispense Refill   ascorbic acid  (VITAMIN C) 500 MG tablet Take 500 mg by mouth daily.     baclofen  (LIORESAL ) 10 MG tablet Take 0.5-1 tablets (5-10 mg total) by mouth at bedtime as needed. 30 each 0   calcium  carbonate (OSCAL) 1500 (600 Ca) MG TABS tablet Take 600 mg of elemental calcium  by mouth 2 (two) times daily with a meal.     Calcium   Polycarbophil (FIBER-CAPS PO) Take by mouth.     estradiol  (ESTRACE ) 1 MG tablet Take 1 tablet (1 mg total) by mouth daily. 90 tablet 1   etodolac  (LODINE ) 400 MG tablet Take 1 tablet (400 mg total) by mouth 2 (two) times daily as needed.  3   fexofenadine (ALLEGRA) 180 MG tablet Take 180 mg by mouth daily.     folic acid  (FOLVITE ) 1 MG tablet Take 1 mg by mouth daily.     hydrochlorothiazide  (HYDRODIURIL ) 25 MG tablet Take 1 tablet (25 mg total) by mouth daily. 90 tablet 3   hydroxychloroquine (PLAQUENIL) 200 MG tablet TAKE 1 TABLET BY MOUTH TWICE DAILY ON TUESDAY, THURSDAY, SATURDAY, SUNDAY AND 1 TABLET ONCE DAILY ON MONDAY,WEDNESDAY,AND FRIDAY. TAKE WITH FOOD.     losartan  (COZAAR ) 50 MG tablet Take 1 tablet (50 mg total) by mouth daily. 90 tablet 3   methotrexate  2.5 MG tablet Take 20 mg by mouth once a week. Caution:Chemotherapy. Protect from light.     metoprolol  succinate (TOPROL -XL) 25 MG 24 hr tablet Take 1 tablet (25 mg total) by mouth daily. 90 tablet 3   Multiple Vitamin (MULTIVITAMIN) tablet Take 1 tablet by mouth daily.     Omega-3 Fatty Acids (OMEGA-3 EPA FISH OIL  PO) Take 340-1,000 mg by mouth daily.     omeprazole (PRILOSEC) 20 MG capsule Take 20 mg by mouth daily.     RINVOQ 15 MG TB24 Take 15 mg by mouth daily.     No current facility-administered medications for this visit.    Allergies:   Codeine, Lisinopril, and Sulfa antibiotics   Social History:  The patient  reports that she has never smoked. She has been exposed to tobacco smoke. She has never used smokeless tobacco. She reports that she does not currently use alcohol. She reports that she does not use drugs.   Family History:   family history includes Angina in her mother; Arthritis in her mother; Breast cancer in her paternal grandmother; Cancer in her brother, father, paternal grandmother, sister, and sister; Gout in her father; Heart disease in her paternal grandmother and sister; Hypertension in her mother; Stroke  in her mother; Vision loss in her mother.   Review of Systems: Review of Systems  Constitutional: Negative.   HENT: Negative.    Respiratory: Negative.    Cardiovascular: Negative.   Gastrointestinal: Negative.   Musculoskeletal: Negative.   Neurological: Negative.   Psychiatric/Behavioral: Negative.    All other systems reviewed and are negative.  PHYSICAL EXAM: VS:  BP (!) 140/70 (BP Location: Left Arm, Patient Position: Sitting, Cuff Size: Normal)   Pulse 65   Ht 5' 8 (1.727 m)   Wt 151 lb 2 oz (68.5 kg)   SpO2 95%   BMI 22.98 kg/m  , BMI Body mass index is 22.98 kg/m. Constitutional:  oriented to person, place, and time. No distress.  HENT:  Head: Grossly normal Eyes:  no discharge. No scleral icterus.  Neck: No JVD, no carotid bruits  Cardiovascular: Regular rate and rhythm, no murmurs appreciated Pulmonary/Chest: Clear to auscultation bilaterally, no wheezes or rales Abdominal: Soft.  no distension.  no tenderness.  Musculoskeletal: Normal range of motion Neurological:  normal muscle tone. Coordination normal. No atrophy Skin: Skin warm and dry Psychiatric: normal affect, pleasant   Recent Labs: 09/26/2023: ALT 19; Hemoglobin 12.8; Platelets 344 03/26/2024: BUN 12; Creatinine, Ser 0.80; Potassium 4.0; Sodium 131; TSH 2.570    Lipid Panel Lab Results  Component Value Date   CHOL 189 09/26/2023   HDL 97 09/26/2023   LDLCALC 63 09/26/2023   TRIG 179 (H) 09/26/2023    Wt Readings from Last 3 Encounters:  07/13/24 151 lb 2 oz (68.5 kg)  05/09/24 152 lb 9.6 oz (69.2 kg)  03/26/24 150 lb (68 kg)    ASSESSMENT AND PLAN:  Problem List Items Addressed This Visit       Cardiology Problems   Hypertension   Relevant Orders   EKG 12-Lead (Completed)   Takotsubo cardiomyopathy   Relevant Orders   EKG 12-Lead (Completed)     Other   Subclinical hypothyroidism   Other Visit Diagnoses       NICM (nonischemic cardiomyopathy) (HCC)    -  Primary   Relevant  Orders   EKG 12-Lead (Completed)     Syncope, unspecified syncope type         Shortness of breath         Chronic HFrEF (heart failure with reduced ejection fraction) (HCC)       Relevant Orders   EKG 12-Lead (Completed)     Essential hypertension       Relevant Orders   EKG 12-Lead (Completed)       Cardiomyopathy Ejection fraction normalized December 22 was 60 to 65% Echocardiogram July 2024 normal EF Appears euvolemic, recommend she continue current medication regiment Close monitoring of blood pressure  HTN Blood pressure mildly elevated today, has significant stress at home, has electrician coming later today, planning Production assistant, radio and selling of her house Recommend she monitor blood pressure at home and call us  if numbers run high  Preventive care Suggest she continue walking program over at [facility] for conditioning  Chronic systolic and diastolic CHF Ejection fraction has normalized end of 2022, normal July 2024  Euvolemic, no medication changes made   Signed, Tim Elva Breaker, M.D., Ph.D. [facility] [place], [place] [phone]

## 2024-07-13 ENCOUNTER — Ambulatory Visit: Attending: Cardiovascular Disease | Admitting: Cardiovascular Disease

## 2024-07-13 ENCOUNTER — Encounter: Payer: Self-pay | Admitting: Cardiovascular Disease

## 2024-07-13 VITALS — BP 140/70 | HR 65 | Ht 68.0 in | Wt 151.1 lb

## 2024-07-13 DIAGNOSIS — R0602 Shortness of breath: Secondary | ICD-10-CM

## 2024-07-13 DIAGNOSIS — I428 Other cardiomyopathies: Secondary | ICD-10-CM

## 2024-07-13 DIAGNOSIS — I10 Essential (primary) hypertension: Secondary | ICD-10-CM

## 2024-07-13 DIAGNOSIS — R55 Syncope and collapse: Secondary | ICD-10-CM | POA: Diagnosis not present

## 2024-07-13 DIAGNOSIS — I5181 Takotsubo syndrome: Secondary | ICD-10-CM | POA: Diagnosis not present

## 2024-07-13 DIAGNOSIS — E038 Other specified hypothyroidism: Secondary | ICD-10-CM

## 2024-07-13 DIAGNOSIS — I5022 Chronic systolic (congestive) heart failure: Secondary | ICD-10-CM

## 2024-07-13 MED ORDER — LOSARTAN POTASSIUM 50 MG PO TABS: 50.0000 mg | ORAL_TABLET | Freq: Every day | ORAL | 3 refills | Status: AC

## 2024-07-13 MED ORDER — METOPROLOL SUCCINATE ER 25 MG PO TB24: 25.0000 mg | ORAL_TABLET | Freq: Every day | ORAL | 3 refills | Status: AC

## 2024-07-13 MED ORDER — HYDROCHLOROTHIAZIDE 25 MG PO TABS: 25.0000 mg | ORAL_TABLET | Freq: Every day | ORAL | 3 refills | Status: AC

## 2024-07-13 NOTE — Patient Instructions (Signed)

## 2024-08-08 ENCOUNTER — Other Ambulatory Visit: Payer: Self-pay | Admitting: Family Medicine

## 2024-08-08 DIAGNOSIS — Z1231 Encounter for screening mammogram for malignant neoplasm of breast: Secondary | ICD-10-CM

## 2024-08-28 ENCOUNTER — Encounter: Payer: Self-pay | Admitting: Family Medicine

## 2024-08-28 DIAGNOSIS — B070 Plantar wart: Secondary | ICD-10-CM

## 2024-08-30 ENCOUNTER — Ambulatory Visit
Admission: RE | Admit: 2024-08-30 | Discharge: 2024-08-30 | Disposition: A | Discharge status: Home / Self Care | Source: Ambulatory Visit | Attending: Family Medicine | Admitting: Family Medicine

## 2024-08-30 DIAGNOSIS — Z1231 Encounter for screening mammogram for malignant neoplasm of breast: Secondary | ICD-10-CM | POA: Diagnosis not present

## 2024-09-04 ENCOUNTER — Ambulatory Visit: Payer: Self-pay | Admitting: Family Medicine

## 2024-09-04 DIAGNOSIS — M2012 Hallux valgus (acquired), left foot: Secondary | ICD-10-CM | POA: Diagnosis not present

## 2024-09-04 DIAGNOSIS — I5181 Takotsubo syndrome: Secondary | ICD-10-CM | POA: Diagnosis not present

## 2024-09-04 DIAGNOSIS — I10 Essential (primary) hypertension: Secondary | ICD-10-CM | POA: Diagnosis not present

## 2024-09-04 DIAGNOSIS — M2042 Other hammer toe(s) (acquired), left foot: Secondary | ICD-10-CM | POA: Diagnosis not present

## 2024-09-17 DIAGNOSIS — L97521 Non-pressure chronic ulcer of other part of left foot limited to breakdown of skin: Secondary | ICD-10-CM | POA: Diagnosis not present

## 2024-09-24 ENCOUNTER — Encounter: Payer: Self-pay | Admitting: Family Medicine

## 2024-09-25 ENCOUNTER — Ambulatory Visit: Payer: Self-pay

## 2024-09-25 VITALS — Ht 67.5 in | Wt 150.0 lb

## 2024-09-25 DIAGNOSIS — Z0000 Encounter for general adult medical examination without abnormal findings: Secondary | ICD-10-CM

## 2024-09-25 NOTE — Patient Instructions (Signed)
 Ms. Cafaro,  Thank you for taking the time for your Medicare Wellness Visit. I appreciate your continued commitment to your health goals. Please review the care plan we discussed, and feel free to reach out if I can assist you further.  Please note that Annual Wellness Visits do not include a physical exam. Some assessments may be limited, especially if the visit was conducted virtually. If needed, we may recommend an in-person follow-up with your provider.  Ongoing Care Seeing your primary care provider every 3 to 6 months helps us  monitor your health and provide consistent, personalized care.   Referrals If a referral was made during today's visit and you haven't received any updates within two weeks, please contact the referred provider directly to check on the status.  Recommended Screenings:  You may get the flu vaccine at your next OV on 10/02/24. Keep up the good work!  Health Maintenance  Topic Date Due   Flu Shot  06/08/2024   COVID-19 Vaccine (8 - 2025-26 season) 07/09/2024   Medicare Annual Wellness Visit  09/21/2024   Breast Cancer Screening  08/30/2025   DEXA scan (bone density measurement)  10/05/2025   DTaP/Tdap/Td vaccine (2 - Tdap) 07/25/2028   Pneumococcal Vaccine for age over 77  Completed   Hepatitis C Screening  Completed   Zoster (Shingles) Vaccine  Completed   Meningitis B Vaccine  Aged Out   Colon Cancer Screening  Discontinued       09/25/2024    9:24 AM  Advanced Directives  Does Patient Have a Medical Advance Directive? Yes  Type of Estate Agent of [place];Living will  Does patient want to make changes to medical advance directive? No - Patient declined  Copy of Healthcare Power of Attorney in Chart? Yes - validated most recent copy scanned in chart (See row information)    Vision: Annual vision screenings are recommended for early detection of glaucoma, cataracts, and diabetic retinopathy. These exams can also reveal signs of  chronic conditions such as diabetes and high blood pressure.  Dental: Annual dental screenings help detect early signs of oral cancer, gum disease, and other conditions linked to overall health, including heart disease and diabetes.  Please see the attached documents for additional preventive care recommendations.   Fall Prevention in the Home, Adult Falls can cause injuries and affect people of all ages. There are many simple things that you can do to make your home safe and to help prevent falls. If you need it, ask for help making these changes. What actions can I take to prevent falls? General information Use good lighting in all rooms. Make sure to: Replace any light bulbs that burn out. Turn on lights if it is dark and use night-lights. Keep items that you use often in easy-to-reach places. Lower the shelves around your home if needed. Move furniture so that there are clear paths around it. Do not keep throw rugs or other things on the floor that can make you trip. If any of your floors are uneven, fix them. Add color or contrast paint or tape to clearly mark and help you see: Grab bars or handrails. First and last steps of staircases. Where the edge of each step is. If you use a ladder or stepladder: Make sure that it is fully opened. Do not climb a closed ladder. Make sure the sides of the ladder are locked in place. Have someone hold the ladder while you use it. Know where your pets are as  you move through your home. What can I do in the bathroom?     Keep the floor dry. Clean up any water that is on the floor right away. Remove soap buildup in the bathtub or shower. Buildup makes bathtubs and showers slippery. Use non-skid mats or decals on the floor of the bathtub or shower. Attach bath mats securely with double-sided, non-slip rug tape. If you need to sit down while you are in the shower, use a non-slip stool. Install grab bars by the toilet and in the bathtub and  shower. Do not use towel bars as grab bars. What can I do in the bedroom? Make sure that you have a light by your bed that is easy to reach. Do not use any sheets or blankets on your bed that hang to the floor. Have a firm bench or chair with side arms that you can use for support when you get dressed. What can I do in the kitchen? Clean up any spills right away. If you need to reach something above you, use a sturdy step stool that has a grab bar. Keep electrical cables out of the way. Do not use floor polish or wax that makes floors slippery. What can I do with my stairs? Do not leave anything on the stairs. Make sure that you have a light switch at the top and the bottom of the stairs. Have them installed if you do not have them. Make sure that there are handrails on both sides of the stairs. Fix handrails that are broken or loose. Make sure that handrails are as long as the staircases. Install non-slip stair treads on all stairs in your home if they do not have carpet. Avoid having throw rugs at the top or bottom of stairs, or secure the rugs with carpet tape to prevent them from moving. Choose a carpet design that does not hide the edge of steps on the stairs. Make sure that carpet is firmly attached to the stairs. Fix any carpet that is loose or worn. What can I do on the outside of my home? Use bright outdoor lighting. Repair the edges of walkways and driveways and fix any cracks. Clear paths of anything that can make you trip, such as tools or rocks. Add color or contrast paint or tape to clearly mark and help you see high doorway thresholds. Trim any bushes or trees on the main path into your home. Check that handrails are securely fastened and in good repair. Both sides of all steps should have handrails. Install guardrails along the edges of any raised decks or porches. Have leaves, snow, and ice cleared regularly. Use sand, salt, or ice melt on walkways during winter months if you  live where there is ice and snow. In the garage, clean up any spills right away, including grease or oil spills. What other actions can I take? Review your medicines with your health care provider. Some medicines can make you confused or feel dizzy. This can increase your chance of falling. Wear closed-toe shoes that fit well and support your feet. Wear shoes that have rubber soles and low heels. Use a cane, walker, scooter, or crutches that help you move around if needed. Talk with your provider about other ways that you can decrease your risk of falls. This may include seeing a physical therapist to learn to do exercises to improve movement and strength. Where to find more information Centers for Disease Control and Prevention, STEADI: tonerpromos.no [employer] on  Aging: baseringtones.pl National Institute on Aging: baseringtones.pl Contact a health care provider if: You are afraid of falling at home. You feel weak, drowsy, or dizzy at home. You fall at home. Get help right away if you: Lose consciousness or have trouble moving after a fall. Have a fall that causes a head injury. These symptoms may be an emergency. Get help right away. Call 911. Do not wait to see if the symptoms will go away. Do not drive yourself to the hospital. This information is not intended to replace advice given to you by your health care provider. Make sure you discuss any questions you have with your health care provider. Document Revised: 06/28/2022 Document Reviewed: 06/28/2022 Elsevier Patient Education  2024 [employer].

## 2024-09-25 NOTE — Progress Notes (Addendum)
 Chief Complaint  Patient presents with   Medicare Wellness     Subjective:   Barbara Tucker is a 78 y.o. female who presents for a Medicare Annual Wellness Visit.  Allergies (verified) Codeine, Lisinopril, and Sulfa antibiotics   History: Past Medical History:  Diagnosis Date   Allergic rhinitis    Allergy    Cataract    no surgery ... yet   Endometriosis    Heart murmur 1975   minimal, seen on ultrasound long ago   Hypertension    Hypothyroidism    IBS (irritable bowel syndrome)    Migraine    Osteoarthritis    Rheumatoid arthritis (HCC)    Past Surgical History:  Procedure Laterality Date   ABDOMINAL HYSTERECTOMY  1992   complete   APPENDECTOMY     BREAST EXCISIONAL BIOPSY Right 1975   excisional - negative   BREAST SURGERY  1975?   benign cyst removed   COLONOSCOPY WITH PROPOFOL  N/A 02/03/2016   Procedure: COLONOSCOPY WITH PROPOFOL ;  Surgeon: Gladis RAYMOND Mariner, MD;  Location: [facility] ENDOSCOPY;  Service: Endoscopy;  Laterality: N/A;   LEFT HEART CATH AND CORONARY ANGIOGRAPHY N/A 07/10/2021   Procedure: LEFT HEART CATH AND CORONARY ANGIOGRAPHY;  Surgeon: Darron Deatrice LABOR, MD;  Location: [facility] INVASIVE CV LAB;  Service: Cardiovascular;  Laterality: N/A;   OOPHORECTOMY Bilateral    TONSILLECTOMY     TOTAL ABDOMINAL HYSTERECTOMY W/ BILATERAL SALPINGOOPHORECTOMY     TUBAL LIGATION     Family History  Problem Relation Age of Onset   Breast cancer Paternal Grandmother    Cancer Paternal Grandmother        Breast   Heart disease Paternal Grandmother    Stroke Mother    Arthritis Mother        Osteo and rheumatoid   Hypertension Mother    Angina Mother    Vision loss Mother    Cancer Father        Multiple Myeloma   Gout Father    Cancer Sister        Breast   Heart disease Sister    Cancer Sister        Skin   Cancer Brother        prostate   Social History   Occupational History   Occupation: retired  Tobacco Use   Smoking status: Never     Passive exposure: Past   Smokeless tobacco: Never   Tobacco comments:    never smoked, around lots of second hand  Vaping Use   Vaping status: Never Used  Substance and Sexual Activity   Alcohol use: Not Currently   Drug use: No   Sexual activity: Not Currently   Tobacco Counseling Counseling given: Not Answered Tobacco comments: never smoked, around lots of second hand  SDOH Screenings   Food Insecurity: No Food Insecurity (09/25/2024)  Housing: Low Risk  (09/25/2024)  Transportation Needs: No Transportation Needs (09/25/2024)  Utilities: Not At Risk (09/25/2024)  Alcohol Screen: Low Risk  (09/22/2024)  Depression (PHQ2-9): Low Risk  (09/25/2024)  Financial Resource Strain: Low Risk  (09/22/2024)  Physical Activity: Insufficiently Active (09/25/2024)  Social Connections: Moderately Integrated (09/25/2024)  Stress: No Stress Concern Present (09/25/2024)  Tobacco Use: Low Risk  (09/25/2024)  Health Literacy: Adequate Health Literacy (09/25/2024)   See flowsheets for full screening details  Depression Screen PHQ 2 & 9 Depression Scale- Over the past 2 weeks, how often have you been bothered by any of the following problems? Little  interest or pleasure in doing things: 0 Feeling down, depressed, or hopeless (PHQ Adolescent also includes...irritable): 0 PHQ-2 Total Score: 0 Trouble falling or staying asleep, or sleeping too much: 0 Feeling tired or having little energy: 1 Poor appetite or overeating (PHQ Adolescent also includes...weight loss): 0 Feeling bad about yourself - or that you are a failure or have let yourself or your family down: 0 Trouble concentrating on things, such as reading the newspaper or watching television (PHQ Adolescent also includes...like school work): 0 Moving or speaking so slowly that other people could have noticed. Or the opposite - being so fidgety or restless that you have been moving around a lot more than usual: 0 Thoughts that you would be  better off dead, or of hurting yourself in some way: 0 PHQ-9 Total Score: 1 If you checked off any problems, how difficult have these problems made it for you to do your work, take care of things at home, or get along with other people?: Not difficult at all  Depression Treatment Depression Interventions/Treatment : EYV7-0 Score <4 Follow-up Not Indicated     Goals Addressed               This Visit's Progress     Maintain current health (pt-stated)        COMPLETED: Patient Stated (pt-stated)        Continue to get ready to down-size and move to [facility] and worry less about move.       Visit info / Clinical Intake: Medicare Wellness Visit Type:: Subsequent Annual Wellness Visit Persons participating in visit:: patient Medicare Wellness Visit Mode:: Video Because this visit was a virtual/telehealth visit:: pt reported vitals If Telephone or Video please confirm:: I connected with the patient using audio enabled telemedicine application and verified that I am speaking with the correct person using two identifiers; I discussed the limitations of evaluation and management by telemedicine; The patient expressed understanding and agreed to proceed Patient Location:: home Provider Location:: office/clinic Information given by:: patient Interpreter Needed?: No Pre-visit prep was completed: yes AWV questionnaire completed by patient prior to visit?: yes Date:: 09/22/24 Living arrangements:: (!) lives alone Patient's Overall Health Status Rating: very good Typical amount of pain: some Does pain affect daily life?: no Are you currently prescribed opioids?: no  Dietary Habits and Nutritional Risks How many meals a day?: 3 Eats fruit and vegetables daily?: yes Most meals are obtained by: preparing own meals In the last 2 weeks, have you had any of the following?: none Diabetic:: no  Functional Status Activities of Daily Living (to include ambulation/medication):  Independent Ambulation: Independent Medication Administration: Independent Home Management: Independent Manage your own finances?: yes Primary transportation is: driving Concerns about vision?: no *vision screening is required for WTM* Concerns about hearing?: no  Fall Screening Falls in the past year?: 0 Number of falls in past year: 0 Was there an injury with Fall?: 0 Fall Risk Category Calculator: 0 Patient Fall Risk Level: Low Fall Risk  Fall Risk Patient at Risk for Falls Due to: No Fall Risks Fall risk Follow up: Falls evaluation completed  Home and Transportation Safety: All rugs have non-skid backing?: N/A, no rugs All stairs or steps have railings?: yes Grab bars in the bathtub or shower?: yes Have non-skid surface in bathtub or shower?: yes Good home lighting?: yes Regular seat belt use?: yes Hospital stays in the last year:: no  Cognitive Assessment Difficulty concentrating, remembering, or making decisions? : yes (a little  bit) Will 6CIT or Mini Cog be Completed: yes What year is it?: 0 points What month is it?: 0 points Give patient an address phrase to remember (5 components): [address] [place] About what time is it?: 0 points Count backwards from 20 to 1: 0 points Say the months of the year in reverse: 0 points Repeat the address phrase from earlier: 0 points 6 CIT Score: 0 points  Advance Directives (For Healthcare) Does Patient Have a Medical Advance Directive?: Yes Does patient want to make changes to medical advance directive?: No - Patient declined Type of Advance Directive: Healthcare Power of [place]; Living will Copy of Healthcare Power of Attorney in Chart?: Yes - validated most recent copy scanned in chart (See row information) Copy of Living Will in Chart?: Yes - validated most recent copy scanned in chart (See row information)  Reviewed/Updated  Reviewed/Updated: Reviewed All (Medical, Surgical, Family, Medications, Allergies, Care  Teams, Patient Goals)        Objective:    Today's Vitals   09/25/24 0916  Weight: 150 lb (68 kg)  Height: 5' 7.5 (1.715 m)   Body mass index is 23.15 kg/m.  Current Medications (verified) Outpatient Encounter Medications as of 09/25/2024  Medication Sig   ascorbic acid  (VITAMIN C) 500 MG tablet Take 500 mg by mouth daily.   baclofen  (LIORESAL ) 10 MG tablet Take 0.5-1 tablets (5-10 mg total) by mouth at bedtime as needed.   calcium  carbonate (OSCAL) 1500 (600 Ca) MG TABS tablet Take 600 mg of elemental calcium  by mouth 2 (two) times daily with a meal.   Calcium  Polycarbophil (FIBER-CAPS PO) Take by mouth. (Patient taking differently: Take by mouth. Daily at bedtime)   estradiol  (ESTRACE ) 1 MG tablet Take 1 tablet (1 mg total) by mouth daily.   etodolac  (LODINE ) 400 MG tablet Take 1 tablet (400 mg total) by mouth 2 (two) times daily as needed.   fexofenadine (ALLEGRA) 180 MG tablet Take 180 mg by mouth daily.   folic acid  (FOLVITE ) 1 MG tablet Take 1 mg by mouth daily.   hydrochlorothiazide  (HYDRODIURIL ) 25 MG tablet Take 1 tablet (25 mg total) by mouth daily.   hydroxychloroquine (PLAQUENIL) 200 MG tablet TAKE 1 TABLET BY MOUTH TWICE DAILY ON TUESDAY, THURSDAY, SATURDAY, SUNDAY AND 1 TABLET ONCE DAILY ON MONDAY,WEDNESDAY,AND FRIDAY. TAKE WITH FOOD.   losartan  (COZAAR ) 50 MG tablet Take 1 tablet (50 mg total) by mouth daily.   methotrexate  2.5 MG tablet Take 20 mg by mouth once a week. Caution:Chemotherapy. Protect from light.   metoprolol  succinate (TOPROL -XL) 25 MG 24 hr tablet Take 1 tablet (25 mg total) by mouth daily.   Multiple Vitamin (MULTIVITAMIN) tablet Take 1 tablet by mouth daily.   Omega-3 Fatty Acids (OMEGA-3 EPA FISH OIL  PO) Take 340-1,000 mg by mouth daily.   omeprazole (PRILOSEC) 20 MG capsule Take 20 mg by mouth daily.   RINVOQ 15 MG TB24 Take 15 mg by mouth daily.   No facility-administered encounter medications on file as of 09/25/2024.   Hearing/Vision  screen Hearing Screening - Comments:: Denies hearing loss  Vision Screening - Comments:: UTD @ [employer] [place], Dr. Mittie Immunizations and Health Maintenance Health Maintenance  Topic Date Due   Influenza Vaccine  06/08/2024   COVID-19 Vaccine (8 - 2025-26 season) 07/09/2024   Mammogram  08/30/2025   Medicare Annual Wellness (AWV)  09/25/2025   DEXA SCAN  10/05/2025   DTaP/Tdap/Td (2 - Tdap) 07/25/2028   Pneumococcal Vaccine: 50+  Years  Completed   Hepatitis C Screening  Completed   Zoster Vaccines- Shingrix  Completed   Meningococcal B Vaccine  Aged Out   Colonoscopy  Discontinued        Assessment/Plan:  This is a routine wellness examination for [facility].  Patient Care Team: Vicci Duwaine SQUIBB, DO as PCP - General (Family Medicine) Perla Evalene PARAS, MD as PCP - Cardiology (Cardiology) [place], Elsie HERO, PA-C (Rheumatology) Mittie Gaskin, MD as Referring Physician (Ophthalmology) Dermatology, [place] (Dermatology) Neill Boas, DPM (Podiatry) Ashley Soulier, DPM as Referring Physician (Podiatry)  I have personally reviewed and noted the following in the patient's chart:   Medical and social history Use of alcohol, tobacco or illicit drugs  Current medications and supplements including opioid prescriptions. Functional ability and status Nutritional status Physical activity Advanced directives List of other physicians Hospitalizations, surgeries, and ER visits in previous 12 months Vitals Screenings to include cognitive, depression, and falls Referrals and appointments  No orders of the defined types were placed in this encounter.  In addition, I have reviewed and discussed with patient certain preventive protocols, quality metrics, and best practice recommendations. A written personalized care plan for preventive services as well as general preventive health recommendations were provided to patient.   Vina Ned, CMA   09/25/2024   Return  in 1 year (on 09/26/2025).  After Visit Summary: (MyChart) Due to this being a telephonic visit, the after visit summary with patients personalized plan was offered to patient via MyChart   Nurse Notes:  Plans to get flu vaccine at next OV on 10/02/24 Had Covid vaccine on 09/03/24 per patient Screening colonoscopy no longer recommended due to age.

## 2024-10-02 ENCOUNTER — Ambulatory Visit (INDEPENDENT_AMBULATORY_CARE_PROVIDER_SITE_OTHER): Admitting: Family Medicine

## 2024-10-02 ENCOUNTER — Encounter: Payer: Self-pay | Admitting: Family Medicine

## 2024-10-02 VITALS — BP 125/78 | HR 64 | Temp 98.0°F | Ht 67.5 in | Wt 151.0 lb

## 2024-10-02 DIAGNOSIS — I5181 Takotsubo syndrome: Secondary | ICD-10-CM | POA: Diagnosis not present

## 2024-10-02 DIAGNOSIS — Z0000 Encounter for general adult medical examination without abnormal findings: Secondary | ICD-10-CM

## 2024-10-02 DIAGNOSIS — I10 Essential (primary) hypertension: Secondary | ICD-10-CM

## 2024-10-02 DIAGNOSIS — E038 Other specified hypothyroidism: Secondary | ICD-10-CM | POA: Diagnosis not present

## 2024-10-02 DIAGNOSIS — Z23 Encounter for immunization: Secondary | ICD-10-CM

## 2024-10-02 DIAGNOSIS — M059 Rheumatoid arthritis with rheumatoid factor, unspecified: Secondary | ICD-10-CM

## 2024-10-02 DIAGNOSIS — G43109 Migraine with aura, not intractable, without status migrainosus: Secondary | ICD-10-CM | POA: Insufficient documentation

## 2024-10-02 LAB — MICROALBUMIN, URINE WAIVED
Creatinine, Urine Waived: 50 mg/dL (ref 10–300)
Microalb, Ur Waived: 10 mg/L (ref 0–19)

## 2024-10-02 MED ORDER — ESTRADIOL 1 MG PO TABS: 1.0000 mg | ORAL_TABLET | Freq: Every day | ORAL | 1 refills | Status: AC

## 2024-10-02 MED ORDER — FOLIC ACID 1 MG PO TABS: 1.0000 mg | ORAL_TABLET | Freq: Every day | ORAL | 3 refills | Status: AC

## 2024-10-02 MED ORDER — NURTEC 75 MG PO TBDP: 75.0000 mg | ORAL_TABLET | Freq: Every day | ORAL | 4 refills | Status: AC | PRN

## 2024-10-02 NOTE — Assessment & Plan Note (Signed)
 Under good control on current regimen. Continue current regimen. Continue to monitor. Call with any concerns. Refills given. Labs drawn today.

## 2024-10-02 NOTE — Assessment & Plan Note (Signed)
Continue to follow with cardiology. Stable. Call with any concerns.  ?

## 2024-10-02 NOTE — Progress Notes (Signed)
 BP 125/78   Pulse 64   Temp 98 F (36.7 C) (Oral)   Ht 5' 7.5 (1.715 m)   Wt 151 lb (68.5 kg)   SpO2 97%   BMI 23.30 kg/m    Subjective:    Patient ID: Barbara Tucker, female    DOB: Mar 20, 1946, 79 y.o.   MRN: [id number]  HPI: Barbara Tucker is a 78 y.o. female presenting on 10/02/2024 for comprehensive medical examination. Current medical complaints include:  Has been getting migraines again since getting the COVID shot about a month ago. This seems like it's getting worse. She had migraines when she was younger, but it's been quite a while since she had them.   HYPERTENSION  Hypertension status: controlled  Satisfied with current treatment? yes Duration of hypertension: chronic BP monitoring frequency:  rarely BP medication side effects:  no Medication compliance: excellent compliance Previous BP meds: hydrochlorothiazide , losartan , metoprolol  Aspirin : no Recurrent headaches: no Visual changes: no Palpitations: no Dyspnea: no Chest pain: no Lower extremity edema: no Dizzy/lightheaded: no  SUBCLINICAL HYPOTHYROIDISM Thyroid  control status:stable Satisfied with current treatment? yes Medication side effects: N/A Fatigue: yes Cold intolerance: no Heat intolerance: no Weight gain: no Weight loss: no Constipation: no Diarrhea/loose stools: no Palpitations: no Lower extremity edema: no Anxiety/depressed mood: no   She currently lives with: alone Menopausal Symptoms: no  Depression Screen done today and results listed below:     09/25/2024    9:32 AM 05/09/2024    3:36 PM 03/26/2024    1:55 PM 09/26/2023    1:35 PM 09/22/2023    8:22 AM  Depression screen PHQ 2/9  Decreased Interest 0 0 0 0 0  Down, Depressed, Hopeless 0 0 1 0 0  PHQ - 2 Score 0 0 1 0 0  Altered sleeping 0 0 0 0 0  Tired, decreased energy 1 0 1 0 0  Change in appetite 0 0 0 0 0  Feeling bad or failure about yourself  0 0 0 0 0  Trouble concentrating 0 0 0 0 0  Moving  slowly or fidgety/restless 0 0 0 0 0  Suicidal thoughts 0 0 0 0 0  PHQ-9 Score 1 0  2  0  0   Difficult doing work/chores Not difficult at all Not difficult at all Not difficult at all Not difficult at all Not difficult at all     Data saved with a previous flowsheet row definition    Past Medical History:  Past Medical History:  Diagnosis Date   Allergic rhinitis    Allergy    hay fever, drugs as noted   Cataract    no surgery ... yet   Endometriosis    Heart murmur 1975   minimal, seen on ultrasound long ago   Hypertension    mild, controlled by medication   Hypothyroidism    IBS (irritable bowel syndrome)    Migraine    Osteoarthritis    Rheumatoid arthritis (HCC)     Surgical History:  Past Surgical History:  Procedure Laterality Date   ABDOMINAL HYSTERECTOMY  1992   complete   APPENDECTOMY     preventative   BREAST EXCISIONAL BIOPSY Right 1975   excisional - negative   BREAST SURGERY  1975?   benign cyst removed   COLONOSCOPY WITH PROPOFOL  N/A 02/03/2016   Procedure: COLONOSCOPY WITH PROPOFOL ;  Surgeon: Gladis RAYMOND Mariner, MD;  Location: [facility] ENDOSCOPY;  Service: Endoscopy;  Laterality: N/A;   LEFT HEART CATH  AND CORONARY ANGIOGRAPHY N/A 07/10/2021   Procedure: LEFT HEART CATH AND CORONARY ANGIOGRAPHY;  Surgeon: Darron Deatrice LABOR, MD;  Location: [facility] INVASIVE CV LAB;  Service: Cardiovascular;  Laterality: N/A;   OOPHORECTOMY Bilateral    TONSILLECTOMY     TOTAL ABDOMINAL HYSTERECTOMY W/ BILATERAL SALPINGOOPHORECTOMY     TUBAL LIGATION      Medications:  Current Outpatient Medications on File Prior to Visit  Medication Sig   ascorbic acid  (VITAMIN C) 500 MG tablet Take 500 mg by mouth daily.   baclofen  (LIORESAL ) 10 MG tablet Take 0.5-1 tablets (5-10 mg total) by mouth at bedtime as needed.   Calcium  Polycarbophil (FIBER-CAPS PO) Take by mouth. (Patient taking differently: Take by mouth. Daily at bedtime)   etodolac  (LODINE ) 400 MG tablet Take 1 tablet (400  mg total) by mouth 2 (two) times daily as needed.   fexofenadine (ALLEGRA) 180 MG tablet Take 180 mg by mouth daily.   hydrochlorothiazide  (HYDRODIURIL ) 25 MG tablet Take 1 tablet (25 mg total) by mouth daily.   hydroxychloroquine (PLAQUENIL) 200 MG tablet TAKE 1 TABLET BY MOUTH TWICE DAILY ON TUESDAY, THURSDAY, SATURDAY, SUNDAY AND 1 TABLET ONCE DAILY ON MONDAY,WEDNESDAY,AND FRIDAY. TAKE WITH FOOD.   losartan  (COZAAR ) 50 MG tablet Take 1 tablet (50 mg total) by mouth daily.   methotrexate  2.5 MG tablet Take 20 mg by mouth once a week. Caution:Chemotherapy. Protect from light.   metoprolol  succinate (TOPROL -XL) 25 MG 24 hr tablet Take 1 tablet (25 mg total) by mouth daily.   Multiple Vitamin (MULTIVITAMIN) tablet Take 1 tablet by mouth daily.   Omega-3 Fatty Acids (OMEGA-3 EPA FISH OIL  PO) Take 340-1,000 mg by mouth daily.   omeprazole (PRILOSEC) 20 MG capsule Take 20 mg by mouth daily.   RINVOQ 15 MG TB24 Take 15 mg by mouth daily.   calcium  carbonate (OSCAL) 1500 (600 Ca) MG TABS tablet Take 600 mg of elemental calcium  by mouth 2 (two) times daily with a meal.   No current facility-administered medications on file prior to visit.    Allergies:  Allergies  Allergen Reactions   Codeine    Lisinopril Other (See Comments)    Migraine   Sulfa Antibiotics     Social History:  Social History   Socioeconomic History   Marital status: Widowed    Spouse name: Not on file   Number of children: 0   Years of education: Not on file   Highest education level: Master's degree (e.g., MA, MS, MEng, MEd, MSW, MBA)  Occupational History   Occupation: retired  Tobacco Use   Smoking status: Never    Passive exposure: Past   Smokeless tobacco: Never   Tobacco comments:    never smoked, around lots of second hand  Vaping Use   Vaping status: Never Used  Substance and Sexual Activity   Alcohol use: Not Currently   Drug use: Never   Sexual activity: Not Currently  Other Topics Concern   Not  on file  Social History Narrative   Widowed, 2 step-children   Social Drivers of Health   Financial Resource Strain: Low Risk  (09/22/2024)   Overall Financial Resource Strain (CARDIA)    Difficulty of Paying Living Expenses: Not hard at all  Food Insecurity: No Food Insecurity (09/25/2024)   Hunger Vital Sign    Worried About Running Out of Food in the Last Year: Never true    Ran Out of Food in the Last Year: Never true  Transportation Needs: No Transportation  Needs (09/25/2024)   PRAPARE - Administrator, Civil Service (Medical): No    Lack of Transportation (Non-Medical): No  Physical Activity: Insufficiently Active (09/25/2024)   Exercise Vital Sign    Days of Exercise per Week: 6 days    Minutes of Exercise per Session: 20 min  Stress: No Stress Concern Present (09/25/2024)   [employer] of Occupational Health - Occupational Stress Questionnaire    Feeling of Stress: Only a little  Social Connections: Moderately Integrated (09/25/2024)   Social Connection and Isolation Panel    Frequency of Communication with Friends and Family: Three times a week    Frequency of Social Gatherings with Friends and Family: Three times a week    Attends Religious Services: More than 4 times per year    Active Member of Clubs or Organizations: Yes    Attends Banker Meetings: More than 4 times per year    Marital Status: Widowed  Intimate Partner Violence: Not At Risk (09/25/2024)   Humiliation, Afraid, Rape, and Kick questionnaire    Fear of Current or Ex-Partner: No    Emotionally Abused: No    Physically Abused: No    Sexually Abused: No   Social History   Tobacco Use  Smoking Status Never   Passive exposure: Past  Smokeless Tobacco Never  Tobacco Comments   never smoked, around lots of second hand   Social History   Substance and Sexual Activity  Alcohol Use Not Currently    Family History:  Family History  Problem Relation Age of Onset    Breast cancer Paternal Grandmother    Cancer Paternal Grandmother        Breast   Heart disease Paternal Grandmother    Stroke Mother    Arthritis Mother        Osteo and rheumatoid   Hypertension Mother    Angina Mother    Vision loss Mother    Cancer Father        Multiple Myeloma   Gout Father    Cancer Sister        Breast   Heart disease Sister    Cancer Sister        Skin   Cancer Brother        prostate    Past medical history, surgical history, medications, allergies, family history and social history reviewed with patient today and changes made to appropriate areas of the chart.   Review of Systems  Constitutional: Negative.   HENT: Negative.    Eyes:  Positive for blurred vision. Negative for double vision, photophobia, pain, discharge and redness.       + aura with migraine  Respiratory: Negative.    Cardiovascular: Negative.   Gastrointestinal:  Positive for constipation and diarrhea. Negative for abdominal pain, blood in stool, heartburn, melena, nausea and vomiting.  Genitourinary: Negative.   Musculoskeletal:  Positive for neck pain. Negative for back pain, falls, joint pain and myalgias.  Skin: Negative.   Neurological:  Positive for headaches. Negative for dizziness, tingling, tremors, sensory change, speech change, focal weakness, seizures, loss of consciousness and weakness.  Endo/Heme/Allergies: Negative.   Psychiatric/Behavioral: Negative.     All other ROS negative except what is listed above and in the HPI.      Objective:    BP 125/78   Pulse 64   Temp 98 F (36.7 C) (Oral)   Ht 5' 7.5 (1.715 m)   Wt 151 lb (68.5 kg)  SpO2 97%   BMI 23.30 kg/m   Wt Readings from Last 3 Encounters:  10/02/24 151 lb (68.5 kg)  09/25/24 150 lb (68 kg)  07/13/24 151 lb 2 oz (68.5 kg)    Physical Exam Vitals and nursing note reviewed.  Constitutional:      General: She is not in acute distress.    Appearance: Normal appearance. She is not  ill-appearing, toxic-appearing or diaphoretic.  HENT:     Head: Normocephalic and atraumatic.     Right Ear: Tympanic membrane, ear canal and external ear normal. There is no impacted cerumen.     Left Ear: Tympanic membrane, ear canal and external ear normal. There is no impacted cerumen.     Nose: Nose normal. No congestion or rhinorrhea.     Mouth/Throat:     Mouth: Mucous membranes are moist.     Pharynx: Oropharynx is clear. No oropharyngeal exudate or posterior oropharyngeal erythema.  Eyes:     General: No scleral icterus.       Right eye: No discharge.        Left eye: No discharge.     Extraocular Movements: Extraocular movements intact.     Conjunctiva/sclera: Conjunctivae normal.     Pupils: Pupils are equal, round, and reactive to light.  Neck:     Vascular: No carotid bruit.  Cardiovascular:     Rate and Rhythm: Normal rate and regular rhythm.     Pulses: Normal pulses.     Heart sounds: No murmur heard.    No friction rub. No gallop.  Pulmonary:     Effort: Pulmonary effort is normal. No respiratory distress.     Breath sounds: Normal breath sounds. No stridor. No wheezing, rhonchi or rales.  Chest:     Chest wall: No tenderness.  Abdominal:     General: Abdomen is flat. Bowel sounds are normal. There is no distension.     Palpations: Abdomen is soft. There is no mass.     Tenderness: There is no abdominal tenderness. There is no right CVA tenderness, left CVA tenderness, guarding or rebound.     Hernia: No hernia is present.  Genitourinary:    Comments: Breast and pelvic exams deferred with shared decision making Musculoskeletal:        General: No swelling, tenderness, deformity or signs of injury.     Cervical back: Normal range of motion and neck supple. No rigidity. No muscular tenderness.     Right lower leg: No edema.     Left lower leg: No edema.  Lymphadenopathy:     Cervical: No cervical adenopathy.  Skin:    General: Skin is warm and dry.      Capillary Refill: Capillary refill takes less than 2 seconds.     Coloration: Skin is not jaundiced or pale.     Findings: No bruising, erythema, lesion or rash.  Neurological:     General: No focal deficit present.     Mental Status: She is alert and oriented to person, place, and time. Mental status is at baseline.     Cranial Nerves: No cranial nerve deficit.     Sensory: No sensory deficit.     Motor: No weakness.     Coordination: Coordination normal.     Gait: Gait normal.     Deep Tendon Reflexes: Reflexes normal.  Psychiatric:        Mood and Affect: Mood normal.        Behavior: Behavior normal.  Thought Content: Thought content normal.        Judgment: Judgment normal.     Results for orders placed or performed in visit on 03/26/24  Basic metabolic panel with GFR   Collection Time: 03/26/24  1:43 PM  Result Value Ref Range   Glucose 107 (H) 70 - 99 mg/dL   BUN 12 8 - 27 mg/dL   Creatinine, Ser 9.19 0.57 - 1.00 mg/dL   eGFR 75 >40 fO/fpw/8.26   BUN/Creatinine Ratio 15 12 - 28   Sodium 131 (L) 134 - 144 mmol/L   Potassium 4.0 3.5 - 5.2 mmol/L   Chloride 94 (L) 96 - 106 mmol/L   CO2 23 20 - 29 mmol/L   Calcium  10.0 8.7 - 10.3 mg/dL  TSH   Collection Time: 03/26/24  1:43 PM  Result Value Ref Range   TSH 2.570 0.450 - 4.500 uIU/mL      Assessment & Plan:   Problem List Items Addressed This Visit       Cardiovascular and Mediastinum   Hypertension   Under good control on current regimen. Continue current regimen. Continue to monitor. Call with any concerns. Refills given. Labs drawn today.        Relevant Orders   CBC with Differential/Platelet   Comprehensive metabolic panel with GFR   Microalbumin, Urine Waived   Takotsubo cardiomyopathy   Continue to follow with cardiology. Stable. Call with any concerns.       Relevant Orders   CBC with Differential/Platelet   Comprehensive metabolic panel with GFR   Lipid Panel w/o Chol/HDL Ratio    Migraine with aura and without status migrainosus, not intractable   Restarted after covid shot. Will start her on nurtec. Unable to take triptans due to takosubo cardiomyopathy and risk of vasospasm. Will follow up in about 3 months. Call if not getting better or getting worse.       Relevant Medications   Rimegepant Sulfate (NURTEC) 75 MG TBDP     Endocrine   Subclinical hypothyroidism   Rechecking labs today. Await results. Treat as needed.       Relevant Orders   CBC with Differential/Platelet   Comprehensive metabolic panel with GFR   TSH     Musculoskeletal and Integument   Rheumatoid arthritis (HCC)   Continue to follow with rheumatology. Call with any concerns continue to monitor.       Relevant Orders   CBC with Differential/Platelet   Comprehensive metabolic panel with GFR   Other Visit Diagnoses       Routine general medical examination at a health care facility    -  Primary   Vaccines up to date. Screening labs checked today. DEXA and mammo up to date. Continue diet and exercise. Call with any concerns.        Follow up plan: Return in about 3 months (around 01/02/2025).   LABORATORY TESTING:  - Pap smear: not applicable  IMMUNIZATIONS:   - Tdap: Tetanus vaccination status reviewed: last tetanus booster within 10 years. - Influenza: Administered today - Pneumovax: Up to date - Prevnar: Up to date - COVID: Up to date - HPV: Not applicable - Shingrix vaccine: Up to date  SCREENING: -Mammogram: Up to date  - Colonoscopy: Not applicable  - Bone Density: Up to date    PATIENT COUNSELING:   Advised to take 1 mg of folate supplement per day if capable of pregnancy.   Sexuality: Discussed sexually transmitted diseases, partner selection, use of condoms,  avoidance of unintended pregnancy  and contraceptive alternatives.   Advised to avoid cigarette smoking.  I discussed with the patient that most people either abstain from alcohol or drink within safe  limits (<=14/week and <=4 drinks/occasion for males, <=7/weeks and <= 3 drinks/occasion for females) and that the risk for alcohol disorders and other health effects rises proportionally with the number of drinks per week and how often a drinker exceeds daily limits.  Discussed cessation/primary prevention of drug use and availability of treatment for abuse.   Diet: Encouraged to adjust caloric intake to maintain  or achieve ideal body weight, to reduce intake of dietary saturated fat and total fat, to limit sodium intake by avoiding high sodium foods and not adding table salt, and to maintain adequate dietary potassium and calcium  preferably from fresh fruits, vegetables, and low-fat dairy products.    stressed the importance of regular exercise  Injury prevention: Discussed safety belts, safety helmets, smoke detector, smoking near bedding or upholstery.   Dental health: Discussed importance of regular tooth brushing, flossing, and dental visits.    NEXT PREVENTATIVE PHYSICAL DUE IN 1 YEAR. Return in about 3 months (around 01/02/2025).

## 2024-10-02 NOTE — Assessment & Plan Note (Signed)
 Rechecking labs today. Await results. Treat as needed.

## 2024-10-02 NOTE — Assessment & Plan Note (Signed)
 Continue to follow with rheumatology. Call with any concerns continue to monitor.

## 2024-10-02 NOTE — Assessment & Plan Note (Signed)
 Restarted after covid shot. Will start her on nurtec. Unable to take triptans due to takosubo cardiomyopathy and risk of vasospasm. Will follow up in about 3 months. Call if not getting better or getting worse.

## 2024-10-03 ENCOUNTER — Telehealth: Payer: Self-pay | Admitting: Family Medicine

## 2024-10-03 ENCOUNTER — Ambulatory Visit: Payer: Self-pay | Admitting: Family Medicine

## 2024-10-03 LAB — LIPID PANEL W/O CHOL/HDL RATIO
Cholesterol, Total: 172 mg/dL (ref 100–199)
HDL: 87 mg/dL (ref >39)
LDL Chol Calc (NIH): 56 mg/dL (ref 0–99)
Triglycerides: 182 mg/dL — ABNORMAL HIGH (ref 0–149)
VLDL Cholesterol Cal: 29 mg/dL (ref 5–40)

## 2024-10-03 LAB — COMPREHENSIVE METABOLIC PANEL WITH GFR
ALT: 14 IU/L (ref 0–32)
AST: 16 IU/L (ref 0–40)
Albumin: 4.4 g/dL (ref 3.8–4.8)
Alkaline Phosphatase: 50 IU/L (ref 49–135)
BUN/Creatinine Ratio: 20 (ref 12–28)
BUN: 13 mg/dL (ref 8–27)
Bilirubin Total: 0.4 mg/dL (ref 0.0–1.2)
CO2: 23 mmol/L (ref 20–29)
Calcium: 9.7 mg/dL (ref 8.7–10.3)
Chloride: 93 mmol/L — ABNORMAL LOW (ref 96–106)
Creatinine, Ser: 0.66 mg/dL (ref 0.57–1.00)
Globulin, Total: 2.1 g/dL (ref 1.5–4.5)
Glucose: 108 mg/dL — ABNORMAL HIGH (ref 70–99)
Potassium: 3.8 mmol/L (ref 3.5–5.2)
Sodium: 133 mmol/L — ABNORMAL LOW (ref 134–144)
Total Protein: 6.5 g/dL (ref 6.0–8.5)
eGFR: 90 mL/min/1.73 (ref >59)

## 2024-10-03 LAB — CBC WITH DIFFERENTIAL/PLATELET
Basophils Absolute: 0 x10E3/uL (ref 0.0–0.2)
Basos: 0 %
EOS (ABSOLUTE): 0.1 x10E3/uL (ref 0.0–0.4)
Eos: 1 %
Hematocrit: 37.1 % (ref 34.0–46.6)
Hemoglobin: 12.5 g/dL (ref 11.1–15.9)
Immature Grans (Abs): 0 x10E3/uL (ref 0.0–0.1)
Immature Granulocytes: 0 %
Lymphocytes Absolute: 1.2 x10E3/uL (ref 0.7–3.1)
Lymphs: 17 %
MCH: 32.8 pg (ref 26.6–33.0)
MCHC: 33.7 g/dL (ref 31.5–35.7)
MCV: 97 fL (ref 79–97)
Monocytes Absolute: 0.6 x10E3/uL (ref 0.1–0.9)
Monocytes: 8 %
Neutrophils Absolute: 5.4 x10E3/uL (ref 1.4–7.0)
Neutrophils: 74 %
Platelets: 306 x10E3/uL (ref 150–450)
RBC: 3.81 x10E6/uL (ref 3.77–5.28)
RDW: 12.3 % (ref 11.7–15.4)
WBC: 7.4 x10E3/uL (ref 3.4–10.8)

## 2024-10-03 LAB — TSH: TSH: 2.84 u[IU]/mL (ref 0.450–4.500)

## 2024-10-03 NOTE — Telephone Encounter (Signed)
 Copied from CRM #[id number]. Topic: General - Other >> Oct 03, 2024  8:47 AM Victoria B wrote: Reason for CRM: Patient wants to know if she can come pickup more samples of nurtec, before the holiday.

## 2024-10-03 NOTE — Telephone Encounter (Signed)
 We don't currently have any more samples. She was given 2 yesterday- is there a reason why she needs more?

## 2024-10-03 NOTE — Telephone Encounter (Signed)
 Routing to provider to advise.

## 2024-10-05 ENCOUNTER — Telehealth: Payer: Self-pay

## 2024-10-05 ENCOUNTER — Other Ambulatory Visit (HOSPITAL_COMMUNITY): Payer: Self-pay

## 2024-10-05 NOTE — Telephone Encounter (Signed)
 Pharmacy Patient Advocate Encounter   Received notification from [employer] office that prior authorization for Nurtec 75MG  dispersible tablets  is required/requested.   Insurance verification completed.   The patient is insured through [employer].   Per test claim: PA required; PA submitted to above mentioned insurance via Latent Key/confirmation #/EOC [id number] Status is pending

## 2024-10-10 ENCOUNTER — Other Ambulatory Visit (HOSPITAL_COMMUNITY): Payer: Self-pay

## 2024-10-10 NOTE — Telephone Encounter (Signed)
 Pharmacy Patient Advocate Encounter  Received notification from [facility] that Prior Authorization for Nurtec 75MG  dispersible tablets has been APPROVED to 12.02.26. This test claim was processed through [facility]- copay amounts may vary at other pharmacies due to pharmacy/plan contracts, or as the patient moves through the different stages of their insurance plan.   PA #/Case ID/Reference #: [id number]

## 2024-10-11 ENCOUNTER — Encounter: Payer: Self-pay | Admitting: Family Medicine

## 2024-10-17 DIAGNOSIS — M0579 Rheumatoid arthritis with rheumatoid factor of multiple sites without organ or systems involvement: Secondary | ICD-10-CM | POA: Diagnosis not present

## 2024-10-17 DIAGNOSIS — Z79899 Other long term (current) drug therapy: Secondary | ICD-10-CM | POA: Diagnosis not present

## 2025-01-04 ENCOUNTER — Ambulatory Visit: Admitting: Family Medicine

## 2025-09-26 ENCOUNTER — Ambulatory Visit
